# Patient Record
Sex: Female | Born: 1945
Health system: Southern US, Community
[De-identification: ages and names within clinical notes are randomized; demographics above are authoritative.]

## PROBLEM LIST (undated history)

## (undated) DIAGNOSIS — I2510 Atherosclerotic heart disease of native coronary artery without angina pectoris: Secondary | ICD-10-CM

## (undated) DIAGNOSIS — I10 Essential (primary) hypertension: Secondary | ICD-10-CM

## (undated) DIAGNOSIS — M1990 Unspecified osteoarthritis, unspecified site: Secondary | ICD-10-CM

## (undated) DIAGNOSIS — G629 Polyneuropathy, unspecified: Secondary | ICD-10-CM

## (undated) DIAGNOSIS — I8390 Asymptomatic varicose veins of unspecified lower extremity: Secondary | ICD-10-CM

## (undated) DIAGNOSIS — E039 Hypothyroidism, unspecified: Secondary | ICD-10-CM

## (undated) DIAGNOSIS — R58 Hemorrhage, not elsewhere classified: Secondary | ICD-10-CM

## (undated) DIAGNOSIS — I209 Angina pectoris, unspecified: Secondary | ICD-10-CM

## (undated) HISTORY — PX: SHOULDER ARTHROSCOPY: SHX128

## (undated) HISTORY — PX: TUMOR REMOVAL: SHX12

## (undated) HISTORY — PX: THYROID SURGERY: SHX805

## (undated) HISTORY — PX: JOINT REPLACEMENT: SHX530

## (undated) HISTORY — PX: ABDOMINAL HYSTERECTOMY: SHX81

---

## 2003-10-25 ENCOUNTER — Ambulatory Visit: Payer: Self-pay | Admitting: Internal Medicine

## 2004-05-02 ENCOUNTER — Ambulatory Visit: Payer: Self-pay | Admitting: Internal Medicine

## 2004-06-23 ENCOUNTER — Ambulatory Visit: Payer: Self-pay | Admitting: Unknown Physician Specialty

## 2005-04-24 ENCOUNTER — Ambulatory Visit: Payer: Self-pay | Admitting: Internal Medicine

## 2005-11-07 ENCOUNTER — Ambulatory Visit: Payer: Self-pay | Admitting: Internal Medicine

## 2006-07-19 ENCOUNTER — Observation Stay: Payer: Self-pay | Admitting: Internal Medicine

## 2006-07-19 ENCOUNTER — Other Ambulatory Visit: Payer: Self-pay

## 2006-08-03 ENCOUNTER — Ambulatory Visit: Payer: Self-pay | Admitting: Internal Medicine

## 2006-11-07 ENCOUNTER — Encounter: Payer: Self-pay | Admitting: Unknown Physician Specialty

## 2006-11-23 ENCOUNTER — Encounter: Payer: Self-pay | Admitting: Unknown Physician Specialty

## 2007-12-30 ENCOUNTER — Ambulatory Visit: Payer: Self-pay | Admitting: Internal Medicine

## 2008-08-02 ENCOUNTER — Inpatient Hospital Stay: Payer: Self-pay | Admitting: Internal Medicine

## 2008-08-10 ENCOUNTER — Ambulatory Visit: Payer: Self-pay | Admitting: Internal Medicine

## 2008-08-17 ENCOUNTER — Ambulatory Visit: Payer: Self-pay | Admitting: Internal Medicine

## 2008-08-31 ENCOUNTER — Ambulatory Visit: Payer: Self-pay | Admitting: Otolaryngology

## 2008-10-22 ENCOUNTER — Ambulatory Visit: Payer: Self-pay | Admitting: Unknown Physician Specialty

## 2008-10-26 ENCOUNTER — Ambulatory Visit: Payer: Self-pay | Admitting: Unknown Physician Specialty

## 2009-04-08 ENCOUNTER — Ambulatory Visit: Payer: Self-pay | Admitting: Internal Medicine

## 2009-10-03 ENCOUNTER — Emergency Department: Payer: Self-pay | Admitting: Emergency Medicine

## 2009-10-14 ENCOUNTER — Encounter: Admission: RE | Admit: 2009-10-14 | Discharge: 2009-10-14 | Payer: Self-pay | Admitting: Unknown Physician Specialty

## 2009-12-30 ENCOUNTER — Ambulatory Visit: Payer: Self-pay

## 2010-01-27 ENCOUNTER — Ambulatory Visit: Payer: Self-pay | Admitting: Unknown Physician Specialty

## 2010-02-03 ENCOUNTER — Ambulatory Visit: Payer: Self-pay | Admitting: Unknown Physician Specialty

## 2010-08-28 ENCOUNTER — Ambulatory Visit: Payer: Self-pay | Admitting: Internal Medicine

## 2011-05-31 ENCOUNTER — Ambulatory Visit: Payer: Self-pay | Admitting: Unknown Physician Specialty

## 2011-05-31 LAB — CREATININE, SERUM
Creatinine: 0.81 mg/dL (ref 0.60–1.30)
EGFR (African American): >60
EGFR (Non-African Amer.): >60

## 2011-07-13 ENCOUNTER — Ambulatory Visit: Payer: Self-pay

## 2011-08-29 ENCOUNTER — Ambulatory Visit: Payer: Self-pay | Admitting: Internal Medicine

## 2011-12-11 ENCOUNTER — Emergency Department: Payer: Self-pay | Admitting: Emergency Medicine

## 2011-12-11 LAB — TROPONIN I
Troponin-I: <0.02 ng/mL
Troponin-I: <0.02 ng/mL

## 2011-12-11 LAB — CBC
HCT: 45.2 % (ref 35.0–47.0)
HGB: 15.4 g/dL (ref 12.0–16.0)
MCH: 29.6 pg (ref 26.0–34.0)
MCHC: 34.1 g/dL (ref 32.0–36.0)
MCV: 87 fL (ref 80–100)
Platelet: 204 10*3/uL (ref 150–440)
RBC: 5.21 10*6/uL — ABNORMAL HIGH (ref 3.80–5.20)
RDW: 13.9 % (ref 11.5–14.5)
WBC: 9.2 10*3/uL (ref 3.6–11.0)

## 2011-12-11 LAB — COMPREHENSIVE METABOLIC PANEL
Albumin: 4.1 g/dL (ref 3.4–5.0)
Alkaline Phosphatase: 87 U/L (ref 50–136)
Anion Gap: 9 (ref 7–16)
BUN: 17 mg/dL (ref 7–18)
Bilirubin,Total: 0.3 mg/dL (ref 0.2–1.0)
Calcium, Total: 8.9 mg/dL (ref 8.5–10.1)
Chloride: 106 mmol/L (ref 98–107)
Co2: 27 mmol/L (ref 21–32)
Creatinine: 0.85 mg/dL (ref 0.60–1.30)
EGFR (African American): >60
EGFR (Non-African Amer.): >60
Glucose: 111 mg/dL — ABNORMAL HIGH (ref 65–99)
Osmolality: 285 (ref 275–301)
Potassium: 3.7 mmol/L (ref 3.5–5.1)
SGOT(AST): 17 U/L (ref 15–37)
SGPT (ALT): 18 U/L (ref 12–78)
Sodium: 142 mmol/L (ref 136–145)
Total Protein: 7.3 g/dL (ref 6.4–8.2)

## 2011-12-11 LAB — CK TOTAL AND CKMB (NOT AT ARMC)
CK, Total: 70 U/L (ref 21–215)
CK, Total: 89 U/L (ref 21–215)
CK-MB: 1.3 ng/mL (ref 0.5–3.6)
CK-MB: 1 ng/mL (ref 0.5–3.6)

## 2011-12-11 LAB — PROTIME-INR
INR: 0.9
Prothrombin Time: 12.9 secs (ref 11.5–14.7)

## 2011-12-11 LAB — APTT: Activated PTT: 26.2 secs (ref 23.6–35.9)

## 2012-03-21 ENCOUNTER — Ambulatory Visit: Payer: Self-pay | Admitting: Unknown Physician Specialty

## 2012-12-23 ENCOUNTER — Ambulatory Visit: Payer: Self-pay | Admitting: Internal Medicine

## 2013-01-08 ENCOUNTER — Ambulatory Visit: Payer: Self-pay | Admitting: Internal Medicine

## 2013-01-19 ENCOUNTER — Ambulatory Visit: Payer: Self-pay | Admitting: Internal Medicine

## 2013-01-19 HISTORY — PX: BREAST BIOPSY: SHX20

## 2013-01-20 LAB — PATHOLOGY REPORT

## 2013-07-02 ENCOUNTER — Emergency Department: Payer: Self-pay | Admitting: Emergency Medicine

## 2013-07-02 LAB — URINALYSIS, COMPLETE
Bacteria: NONE SEEN
Bilirubin,UR: NEGATIVE
Blood: NEGATIVE
Glucose,UR: NEGATIVE mg/dL (ref 0–75)
Ketone: NEGATIVE
Leukocyte Esterase: NEGATIVE
Nitrite: NEGATIVE
Ph: 6 (ref 4.5–8.0)
Protein: NEGATIVE
RBC,UR: 1 /HPF (ref 0–5)
Specific Gravity: 1.011 (ref 1.003–1.030)
Squamous Epithelial: 3
WBC UR: <1 /HPF (ref 0–5)

## 2013-07-05 DIAGNOSIS — E039 Hypothyroidism, unspecified: Secondary | ICD-10-CM | POA: Insufficient documentation

## 2013-07-05 DIAGNOSIS — I10 Essential (primary) hypertension: Secondary | ICD-10-CM | POA: Insufficient documentation

## 2013-07-05 DIAGNOSIS — K219 Gastro-esophageal reflux disease without esophagitis: Secondary | ICD-10-CM | POA: Insufficient documentation

## 2013-07-05 DIAGNOSIS — M1990 Unspecified osteoarthritis, unspecified site: Secondary | ICD-10-CM | POA: Insufficient documentation

## 2013-07-05 DIAGNOSIS — N289 Disorder of kidney and ureter, unspecified: Secondary | ICD-10-CM | POA: Insufficient documentation

## 2013-07-05 DIAGNOSIS — E785 Hyperlipidemia, unspecified: Secondary | ICD-10-CM | POA: Insufficient documentation

## 2013-07-07 DIAGNOSIS — M5416 Radiculopathy, lumbar region: Secondary | ICD-10-CM | POA: Insufficient documentation

## 2013-07-20 ENCOUNTER — Ambulatory Visit: Payer: Self-pay | Admitting: Nurse Practitioner

## 2013-09-17 DIAGNOSIS — R002 Palpitations: Secondary | ICD-10-CM | POA: Insufficient documentation

## 2014-01-05 ENCOUNTER — Emergency Department: Payer: Self-pay | Admitting: Emergency Medicine

## 2014-03-26 ENCOUNTER — Emergency Department: Payer: Self-pay | Admitting: Emergency Medicine

## 2015-02-25 DIAGNOSIS — M1711 Unilateral primary osteoarthritis, right knee: Secondary | ICD-10-CM | POA: Diagnosis not present

## 2015-03-18 DIAGNOSIS — Z79899 Other long term (current) drug therapy: Secondary | ICD-10-CM | POA: Diagnosis not present

## 2015-03-18 DIAGNOSIS — M1990 Unspecified osteoarthritis, unspecified site: Secondary | ICD-10-CM | POA: Diagnosis not present

## 2015-03-18 DIAGNOSIS — E039 Hypothyroidism, unspecified: Secondary | ICD-10-CM | POA: Diagnosis not present

## 2015-03-18 DIAGNOSIS — G8929 Other chronic pain: Secondary | ICD-10-CM | POA: Diagnosis not present

## 2015-03-18 DIAGNOSIS — E7800 Pure hypercholesterolemia, unspecified: Secondary | ICD-10-CM | POA: Diagnosis not present

## 2015-03-18 DIAGNOSIS — I10 Essential (primary) hypertension: Secondary | ICD-10-CM | POA: Diagnosis not present

## 2015-03-18 DIAGNOSIS — M545 Low back pain: Secondary | ICD-10-CM | POA: Diagnosis not present

## 2015-03-29 DIAGNOSIS — M25561 Pain in right knee: Secondary | ICD-10-CM | POA: Diagnosis not present

## 2015-03-29 DIAGNOSIS — G8929 Other chronic pain: Secondary | ICD-10-CM | POA: Diagnosis not present

## 2015-03-29 DIAGNOSIS — M1711 Unilateral primary osteoarthritis, right knee: Secondary | ICD-10-CM | POA: Diagnosis not present

## 2015-04-14 ENCOUNTER — Encounter
Admission: RE | Admit: 2015-04-14 | Discharge: 2015-04-14 | Disposition: A | Discharge status: Home / Self Care | Payer: PPO | Source: Ambulatory Visit | Attending: Orthopedic Surgery | Admitting: Orthopedic Surgery

## 2015-04-14 DIAGNOSIS — M1711 Unilateral primary osteoarthritis, right knee: Secondary | ICD-10-CM | POA: Insufficient documentation

## 2015-04-14 DIAGNOSIS — Z01812 Encounter for preprocedural laboratory examination: Secondary | ICD-10-CM | POA: Diagnosis not present

## 2015-04-14 DIAGNOSIS — Z01810 Encounter for preprocedural cardiovascular examination: Secondary | ICD-10-CM | POA: Diagnosis not present

## 2015-04-14 HISTORY — DX: Unspecified osteoarthritis, unspecified site: M19.90

## 2015-04-14 HISTORY — DX: Polyneuropathy, unspecified: G62.9

## 2015-04-14 HISTORY — DX: Asymptomatic varicose veins of unspecified lower extremity: I83.90

## 2015-04-14 HISTORY — DX: Hypothyroidism, unspecified: E03.9

## 2015-04-14 HISTORY — DX: Angina pectoris, unspecified: I20.9

## 2015-04-14 HISTORY — DX: Essential (primary) hypertension: I10

## 2015-04-14 HISTORY — DX: Hemorrhage, not elsewhere classified: R58

## 2015-04-14 LAB — CBC
HCT: 43.6 % (ref 35.0–47.0)
Hemoglobin: 14.5 g/dL (ref 12.0–16.0)
MCH: 28.5 pg (ref 26.0–34.0)
MCHC: 33.2 g/dL (ref 32.0–36.0)
MCV: 85.8 fL (ref 80.0–100.0)
Platelets: 173 10*3/uL (ref 150–440)
RBC: 5.08 MIL/uL (ref 3.80–5.20)
RDW: 13.5 % (ref 11.5–14.5)
WBC: 5.7 10*3/uL (ref 3.6–11.0)

## 2015-04-14 LAB — BASIC METABOLIC PANEL
Anion gap: 7 (ref 5–15)
BUN: 19 mg/dL (ref 6–20)
CO2: 25 mmol/L (ref 22–32)
Calcium: 9.3 mg/dL (ref 8.9–10.3)
Chloride: 106 mmol/L (ref 101–111)
Creatinine, Ser: 0.68 mg/dL (ref 0.44–1.00)
GFR calc Af Amer: >60 mL/min (ref >60)
GFR calc non Af Amer: >60 mL/min (ref >60)
Glucose, Bld: 104 mg/dL — ABNORMAL HIGH (ref 65–99)
Potassium: 4.1 mmol/L (ref 3.5–5.1)
Sodium: 138 mmol/L (ref 135–145)

## 2015-04-14 LAB — APTT: aPTT: 27 seconds (ref 24–36)

## 2015-04-14 LAB — URINALYSIS COMPLETE WITH MICROSCOPIC (ARMC ONLY)
Bacteria, UA: NONE SEEN
Bilirubin Urine: NEGATIVE
Glucose, UA: NEGATIVE mg/dL
Hgb urine dipstick: NEGATIVE
Ketones, ur: NEGATIVE mg/dL
Leukocytes, UA: NEGATIVE
Nitrite: NEGATIVE
Protein, ur: NEGATIVE mg/dL
Specific Gravity, Urine: 1.02 (ref 1.005–1.030)
pH: 5 (ref 5.0–8.0)

## 2015-04-14 LAB — PROTIME-INR
INR: 1.1
Prothrombin Time: 14.4 seconds (ref 11.4–15.0)

## 2015-04-14 LAB — TYPE AND SCREEN
ABO/RH(D): B NEG
Antibody Screen: NEGATIVE

## 2015-04-14 LAB — SURGICAL PCR SCREEN
MRSA, PCR: NEGATIVE
Staphylococcus aureus: NEGATIVE

## 2015-04-14 LAB — SEDIMENTATION RATE: Sed Rate: 8 mm/hr (ref 0–30)

## 2015-04-14 LAB — ABO/RH: ABO/RH(D): B NEG

## 2015-04-14 NOTE — Patient Instructions (Signed)
  Your procedure is scheduled on: 04/27/15 Wed Report to Day Surgery.2nd floor medical mall To find out your arrival time please call [phone] between 1PM - 3PM on 04/26/15 Tues.  Remember: Instructions that are not followed completely may result in serious medical risk, up to and including death, or upon the discretion of your surgeon and anesthesiologist your surgery may need to be rescheduled.    _x___ 1. Do not eat food or drink liquids after midnight. No gum chewing or hard candies.     ____ 2. No Alcohol for 24 hours before or after surgery.   ____ 3. Bring all medications with you on the day of surgery if instructed.    _x___ 4. Notify your doctor if there is any change in your medical condition     (cold, fever, infections).     Do not wear jewelry, make-up, hairpins, clips or nail polish.  Do not wear lotions, powders, or perfumes. You may wear deodorant.  Do not shave 48 hours prior to surgery. Men may shave face and neck.  Do not bring valuables to the hospital.    [facility] is not responsible for any belongings or valuables.               Contacts, dentures or bridgework may not be worn into surgery.  Leave your suitcase in the car. After surgery it may be brought to your room.  For patients admitted to the hospital, discharge time is determined by your                treatment team.   Patients discharged the day of surgery will not be allowed to drive home.   Please read over the following fact sheets that you were given:   MRSA Information   ____ Take these medicines the morning of surgery with A SIP OF WATER:    1. enalapril (VASOTEC) 20 MG tablet  2. levothyroxine (SYNTHROID, LEVOTHROID) 150 MCG tablet  3. traMADol (ULTRAM) 50 MG tablet  4.  5.  6.  ____ Fleet Enema (as directed)   _x___ Use CHG Soap as directed  ____ Use inhalers on the day of surgery  ____ Stop metformin 2 days prior to surgery    ____ Take 1/2 of usual insulin dose the night  before surgery and none on the morning of surgery.   _x___ Stop Coumadin/Plavix/aspirin on stop aspirin 1 week before surgery  ____ Stop Anti-inflammatories on    ____ Stop supplements until after surgery.    ____ Bring C-Pap to the hospital.

## 2015-04-16 LAB — URINE CULTURE

## 2015-04-18 LAB — LATEX, IGE: Latex: <0.1 kU/L

## 2015-04-27 ENCOUNTER — Inpatient Hospital Stay: Payer: PPO | Admitting: Anesthesiology

## 2015-04-27 ENCOUNTER — Inpatient Hospital Stay
Admission: RE | Admit: 2015-04-27 | Discharge: 2015-04-29 | DRG: 470 | Disposition: A | Discharge status: Home Health | Payer: PPO | Source: Ambulatory Visit | Attending: Orthopedic Surgery | Admitting: Orthopedic Surgery

## 2015-04-27 ENCOUNTER — Encounter
Admission: RE | Disposition: A | Discharge status: Home Health | Payer: Self-pay | Source: Ambulatory Visit | Attending: Orthopedic Surgery

## 2015-04-27 ENCOUNTER — Encounter: Payer: Self-pay | Admitting: Orthopedic Surgery

## 2015-04-27 ENCOUNTER — Inpatient Hospital Stay: Payer: PPO

## 2015-04-27 DIAGNOSIS — Z96652 Presence of left artificial knee joint: Secondary | ICD-10-CM | POA: Diagnosis present

## 2015-04-27 DIAGNOSIS — Z6841 Body Mass Index (BMI) 40.0 and over, adult: Secondary | ICD-10-CM

## 2015-04-27 DIAGNOSIS — Z79899 Other long term (current) drug therapy: Secondary | ICD-10-CM | POA: Diagnosis not present

## 2015-04-27 DIAGNOSIS — E039 Hypothyroidism, unspecified: Secondary | ICD-10-CM | POA: Diagnosis present

## 2015-04-27 DIAGNOSIS — K219 Gastro-esophageal reflux disease without esophagitis: Secondary | ICD-10-CM | POA: Diagnosis not present

## 2015-04-27 DIAGNOSIS — Z880 Allergy status to penicillin: Secondary | ICD-10-CM

## 2015-04-27 DIAGNOSIS — Z7982 Long term (current) use of aspirin: Secondary | ICD-10-CM | POA: Diagnosis not present

## 2015-04-27 DIAGNOSIS — K449 Diaphragmatic hernia without obstruction or gangrene: Secondary | ICD-10-CM | POA: Insufficient documentation

## 2015-04-27 DIAGNOSIS — M1711 Unilateral primary osteoarthritis, right knee: Principal | ICD-10-CM | POA: Diagnosis present

## 2015-04-27 DIAGNOSIS — Z96651 Presence of right artificial knee joint: Secondary | ICD-10-CM | POA: Diagnosis not present

## 2015-04-27 DIAGNOSIS — E785 Hyperlipidemia, unspecified: Secondary | ICD-10-CM | POA: Diagnosis not present

## 2015-04-27 DIAGNOSIS — I8390 Asymptomatic varicose veins of unspecified lower extremity: Secondary | ICD-10-CM | POA: Diagnosis present

## 2015-04-27 DIAGNOSIS — I10 Essential (primary) hypertension: Secondary | ICD-10-CM | POA: Diagnosis present

## 2015-04-27 DIAGNOSIS — Z885 Allergy status to narcotic agent status: Secondary | ICD-10-CM

## 2015-04-27 DIAGNOSIS — M179 Osteoarthritis of knee, unspecified: Secondary | ICD-10-CM | POA: Diagnosis not present

## 2015-04-27 DIAGNOSIS — Z96659 Presence of unspecified artificial knee joint: Secondary | ICD-10-CM

## 2015-04-27 DIAGNOSIS — Z888 Allergy status to other drugs, medicaments and biological substances status: Secondary | ICD-10-CM | POA: Diagnosis not present

## 2015-04-27 DIAGNOSIS — I739 Peripheral vascular disease, unspecified: Secondary | ICD-10-CM | POA: Diagnosis not present

## 2015-04-27 DIAGNOSIS — Z471 Aftercare following joint replacement surgery: Secondary | ICD-10-CM | POA: Diagnosis not present

## 2015-04-27 HISTORY — PX: KNEE ARTHROPLASTY: SHX992

## 2015-04-27 SURGERY — ARTHROPLASTY, KNEE, TOTAL, USING IMAGELESS COMPUTER-ASSISTED NAVIGATION
Anesthesia: Spinal | Laterality: Right | Wound class: Clean

## 2015-04-27 MED ORDER — LEVOTHYROXINE SODIUM 75 MCG PO TABS
150.0000 ug | ORAL_TABLET | Freq: Every day | ORAL | Status: DC
  Administered 2015-04-28 – 2015-04-29 (×2): 150 ug via ORAL
  Filled 2015-04-27 (×2): qty 2

## 2015-04-27 MED ORDER — ALUM & MAG HYDROXIDE-SIMETH 200-200-20 MG/5ML PO SUSP: 30.0000 mL | ORAL | Status: DC | PRN

## 2015-04-27 MED ORDER — ACETAMINOPHEN 10 MG/ML IV SOLN
INTRAVENOUS | Status: DC | PRN
  Administered 2015-04-27: 1000 mg via INTRAVENOUS

## 2015-04-27 MED ORDER — DIPHENHYDRAMINE HCL 12.5 MG/5ML PO ELIX
12.5000 mg | ORAL_SOLUTION | ORAL | Status: DC | PRN
  Administered 2015-04-28 (×2): 25 mg via ORAL
  Administered 2015-04-29: 12.5 mg via ORAL
  Filled 2015-04-27 (×4): qty 10

## 2015-04-27 MED ORDER — TRANEXAMIC ACID 1000 MG/10ML IV SOLN
1500.0000 mg | INTRAVENOUS | Status: AC
  Administered 2015-04-27: 1500 mg via INTRAVENOUS
  Filled 2015-04-27: qty 15

## 2015-04-27 MED ORDER — MAGNESIUM HYDROXIDE 400 MG/5ML PO SUSP
30.0000 mL | Freq: Every day | ORAL | Status: DC | PRN
  Administered 2015-04-28 – 2015-04-29 (×2): 30 mL via ORAL
  Filled 2015-04-27 (×2): qty 30

## 2015-04-27 MED ORDER — NEOMYCIN-POLYMYXIN B GU 40-200000 IR SOLN
Status: AC
  Filled 2015-04-27: qty 20

## 2015-04-27 MED ORDER — SENNOSIDES-DOCUSATE SODIUM 8.6-50 MG PO TABS
1.0000 | ORAL_TABLET | Freq: Two times a day (BID) | ORAL | Status: DC
  Administered 2015-04-27 – 2015-04-28 (×3): 1 via ORAL
  Filled 2015-04-27 (×3): qty 1

## 2015-04-27 MED ORDER — FENTANYL CITRATE (PF) 100 MCG/2ML IJ SOLN
INTRAMUSCULAR | Status: AC
  Filled 2015-04-27: qty 2

## 2015-04-27 MED ORDER — BUPIVACAINE-EPINEPHRINE (PF) 0.25% -1:200000 IJ SOLN
INTRAMUSCULAR | Status: AC
  Filled 2015-04-27: qty 30

## 2015-04-27 MED ORDER — LACTATED RINGERS IV SOLN
INTRAVENOUS | Status: DC
  Administered 2015-04-27: 1000 mL via INTRAVENOUS

## 2015-04-27 MED ORDER — ACETAMINOPHEN 10 MG/ML IV SOLN
1000.0000 mg | Freq: Four times a day (QID) | INTRAVENOUS | Status: AC
  Administered 2015-04-27 – 2015-04-28 (×4): 1000 mg via INTRAVENOUS
  Filled 2015-04-27 (×5): qty 100

## 2015-04-27 MED ORDER — FENTANYL CITRATE (PF) 100 MCG/2ML IJ SOLN
INTRAMUSCULAR | Status: DC | PRN
  Administered 2015-04-27 (×2): 25 ug via INTRAVENOUS
  Administered 2015-04-27: 50 ug via INTRAVENOUS
  Administered 2015-04-27: 25 ug via INTRAVENOUS
  Administered 2015-04-27: 50 ug via INTRAVENOUS
  Administered 2015-04-27: 25 ug via INTRAVENOUS

## 2015-04-27 MED ORDER — PHENOL 1.4 % MT LIQD: 1.0000 | OROMUCOSAL | Status: DC | PRN

## 2015-04-27 MED ORDER — ENOXAPARIN SODIUM 30 MG/0.3ML ~~LOC~~ SOLN
30.0000 mg | Freq: Two times a day (BID) | SUBCUTANEOUS | Status: DC
  Administered 2015-04-28 – 2015-04-29 (×3): 30 mg via SUBCUTANEOUS
  Filled 2015-04-27 (×3): qty 0.3

## 2015-04-27 MED ORDER — KETAMINE HCL 50 MG/ML IJ SOLN
INTRAMUSCULAR | Status: DC | PRN
  Administered 2015-04-27: 50 mg via INTRAMUSCULAR
  Administered 2015-04-27: 100 mg via INTRAMUSCULAR

## 2015-04-27 MED ORDER — CLINDAMYCIN PHOSPHATE 900 MG/50ML IV SOLN
INTRAVENOUS | Status: AC
  Administered 2015-04-27: 900 mg via INTRAVENOUS
  Filled 2015-04-27: qty 50

## 2015-04-27 MED ORDER — PANTOPRAZOLE SODIUM 40 MG PO TBEC
40.0000 mg | DELAYED_RELEASE_TABLET | Freq: Two times a day (BID) | ORAL | Status: DC
  Administered 2015-04-27 – 2015-04-29 (×4): 40 mg via ORAL
  Filled 2015-04-27 (×4): qty 1

## 2015-04-27 MED ORDER — ENALAPRIL MALEATE 10 MG PO TABS
20.0000 mg | ORAL_TABLET | Freq: Every day | ORAL | Status: DC
  Administered 2015-04-28 – 2015-04-29 (×2): 20 mg via ORAL
  Filled 2015-04-27 (×2): qty 2

## 2015-04-27 MED ORDER — MENTHOL 3 MG MT LOZG: 1.0000 | LOZENGE | OROMUCOSAL | Status: DC | PRN

## 2015-04-27 MED ORDER — ONDANSETRON HCL 4 MG/2ML IJ SOLN
4.0000 mg | Freq: Four times a day (QID) | INTRAMUSCULAR | Status: DC | PRN
  Administered 2015-04-27: 4 mg via INTRAVENOUS
  Filled 2015-04-27: qty 2

## 2015-04-27 MED ORDER — TAPENTADOL HCL 50 MG PO TABS
50.0000 mg | ORAL_TABLET | ORAL | Status: DC | PRN
Start: 2015-04-27 — End: 2015-04-29
  Administered 2015-04-27 – 2015-04-28 (×3): 100 mg via ORAL
  Administered 2015-04-28: 50 mg via ORAL
  Filled 2015-04-27: qty 2
  Filled 2015-04-27: qty 1
  Filled 2015-04-27 (×2): qty 2

## 2015-04-27 MED ORDER — HYDROMORPHONE HCL 1 MG/ML IJ SOLN: 1.0000 mg | INTRAMUSCULAR | Status: DC | PRN

## 2015-04-27 MED ORDER — FAMOTIDINE 20 MG PO TABS
20.0000 mg | ORAL_TABLET | Freq: Once | ORAL | Status: AC
Start: 2015-04-27 — End: 2015-04-27
  Administered 2015-04-27: 20 mg via ORAL

## 2015-04-27 MED ORDER — MIDAZOLAM HCL 5 MG/5ML IJ SOLN
INTRAMUSCULAR | Status: DC | PRN
  Administered 2015-04-27 (×2): 1 mg via INTRAVENOUS
  Administered 2015-04-27: 2 mg via INTRAVENOUS

## 2015-04-27 MED ORDER — BUPIVACAINE LIPOSOME 1.3 % IJ SUSP
INTRAMUSCULAR | Status: AC
  Filled 2015-04-27: qty 20

## 2015-04-27 MED ORDER — ACETAMINOPHEN 10 MG/ML IV SOLN
INTRAVENOUS | Status: AC
  Filled 2015-04-27: qty 100

## 2015-04-27 MED ORDER — NEOMYCIN-POLYMYXIN B GU 40-200000 IR SOLN
Status: DC | PRN
  Administered 2015-04-27: 12 mL

## 2015-04-27 MED ORDER — ONDANSETRON HCL 4 MG/2ML IJ SOLN
INTRAMUSCULAR | Status: DC | PRN
  Administered 2015-04-27: 4 mg via INTRAVENOUS

## 2015-04-27 MED ORDER — HYDROMORPHONE HCL 1 MG/ML IJ SOLN
INTRAMUSCULAR | Status: AC
Start: 2015-04-27 — End: 2015-04-28
  Filled 2015-04-27: qty 1

## 2015-04-27 MED ORDER — BISACODYL 10 MG RE SUPP
10.0000 mg | Freq: Every day | RECTAL | Status: DC | PRN
  Administered 2015-04-28: 10 mg via RECTAL
  Filled 2015-04-27: qty 1

## 2015-04-27 MED ORDER — PROPOFOL 500 MG/50ML IV EMUL
INTRAVENOUS | Status: DC | PRN
  Administered 2015-04-27: 75 ug/kg/min via INTRAVENOUS

## 2015-04-27 MED ORDER — FENTANYL CITRATE (PF) 100 MCG/2ML IJ SOLN
25.0000 ug | INTRAMUSCULAR | Status: DC | PRN
  Administered 2015-04-27 (×3): 50 ug via INTRAVENOUS

## 2015-04-27 MED ORDER — SODIUM CHLORIDE 0.9 % IV SOLN
INTRAVENOUS | Status: DC
  Administered 2015-04-27 – 2015-04-28 (×3): via INTRAVENOUS

## 2015-04-27 MED ORDER — FLEET ENEMA 7-19 GM/118ML RE ENEM: 1.0000 | ENEMA | Freq: Once | RECTAL | Status: DC | PRN

## 2015-04-27 MED ORDER — SODIUM CHLORIDE 0.9 % IV SOLN
10000.0000 ug | INTRAVENOUS | Status: DC | PRN
  Administered 2015-04-27: 20 ug/min via INTRAVENOUS

## 2015-04-27 MED ORDER — BUPIVACAINE HCL (PF) 0.5 % IJ SOLN
INTRAMUSCULAR | Status: DC | PRN
  Administered 2015-04-27: 2 mL

## 2015-04-27 MED ORDER — SODIUM CHLORIDE FLUSH 0.9 % IV SOLN
INTRAVENOUS | Status: AC
  Filled 2015-04-27: qty 10

## 2015-04-27 MED ORDER — OXYCODONE HCL 5 MG PO TABS: 5.0000 mg | ORAL_TABLET | Freq: Once | ORAL | Status: DC | PRN

## 2015-04-27 MED ORDER — FERROUS SULFATE 325 (65 FE) MG PO TABS
325.0000 mg | ORAL_TABLET | Freq: Two times a day (BID) | ORAL | Status: DC
  Administered 2015-04-27 – 2015-04-29 (×4): 325 mg via ORAL
  Filled 2015-04-27 (×5): qty 1

## 2015-04-27 MED ORDER — ONDANSETRON HCL 4 MG PO TABS: 4.0000 mg | ORAL_TABLET | Freq: Four times a day (QID) | ORAL | Status: DC | PRN

## 2015-04-27 MED ORDER — TRANEXAMIC ACID 1000 MG/10ML IV SOLN
1500.0000 mg | Freq: Once | INTRAVENOUS | Status: AC
  Administered 2015-04-27: 1500 mg via INTRAVENOUS
  Filled 2015-04-27: qty 15

## 2015-04-27 MED ORDER — PROPOFOL 10 MG/ML IV BOLUS
INTRAVENOUS | Status: DC | PRN
  Administered 2015-04-27 (×2): 20 mg via INTRAVENOUS
  Administered 2015-04-27: 10 mg via INTRAVENOUS
  Administered 2015-04-27 (×2): 20 mg via INTRAVENOUS

## 2015-04-27 MED ORDER — CELECOXIB 200 MG PO CAPS
200.0000 mg | ORAL_CAPSULE | Freq: Two times a day (BID) | ORAL | Status: DC
  Administered 2015-04-27 – 2015-04-29 (×4): 200 mg via ORAL
  Filled 2015-04-27 (×4): qty 1

## 2015-04-27 MED ORDER — METOCLOPRAMIDE HCL 10 MG PO TABS
10.0000 mg | ORAL_TABLET | Freq: Three times a day (TID) | ORAL | Status: AC
  Administered 2015-04-27 – 2015-04-29 (×7): 10 mg via ORAL
  Filled 2015-04-27 (×8): qty 1

## 2015-04-27 MED ORDER — SODIUM CHLORIDE 0.9 % IV SOLN
INTRAVENOUS | Status: DC | PRN
  Administered 2015-04-27: 60 mL

## 2015-04-27 MED ORDER — TETRACAINE HCL 1 % IJ SOLN
INTRAMUSCULAR | Status: DC | PRN
  Administered 2015-04-27: 10 mg via INTRASPINAL

## 2015-04-27 MED ORDER — BUPIVACAINE-EPINEPHRINE 0.25% -1:200000 IJ SOLN
INTRAMUSCULAR | Status: DC | PRN
  Administered 2015-04-27: 30 mL

## 2015-04-27 MED ORDER — HYDROMORPHONE HCL 1 MG/ML IJ SOLN
0.5000 mg | INTRAMUSCULAR | Status: AC | PRN
  Administered 2015-04-27 (×4): 0.5 mg via INTRAVENOUS

## 2015-04-27 MED ORDER — ACETAMINOPHEN 325 MG PO TABS: 650.0000 mg | ORAL_TABLET | Freq: Four times a day (QID) | ORAL | Status: DC | PRN

## 2015-04-27 MED ORDER — ACETAMINOPHEN 650 MG RE SUPP: 650.0000 mg | Freq: Four times a day (QID) | RECTAL | Status: DC | PRN

## 2015-04-27 MED ORDER — SODIUM CHLORIDE 0.9 % IJ SOLN
INTRAMUSCULAR | Status: AC
  Filled 2015-04-27: qty 50

## 2015-04-27 MED ORDER — HYDROMORPHONE HCL 1 MG/ML IJ SOLN
INTRAMUSCULAR | Status: AC
  Filled 2015-04-27: qty 1

## 2015-04-27 MED ORDER — FAMOTIDINE 20 MG PO TABS
ORAL_TABLET | ORAL | Status: AC
  Administered 2015-04-27: 20 mg via ORAL
  Filled 2015-04-27: qty 1

## 2015-04-27 MED ORDER — OXYCODONE HCL 5 MG/5ML PO SOLN: 5.0000 mg | Freq: Once | ORAL | Status: DC | PRN

## 2015-04-27 MED ORDER — TRAMADOL HCL 50 MG PO TABS
50.0000 mg | ORAL_TABLET | ORAL | Status: DC | PRN
  Administered 2015-04-27 (×2): 50 mg via ORAL
  Administered 2015-04-28 – 2015-04-29 (×3): 100 mg via ORAL
  Filled 2015-04-27: qty 2
  Filled 2015-04-27: qty 1
  Filled 2015-04-27: qty 2
  Filled 2015-04-27: qty 1
  Filled 2015-04-27: qty 2

## 2015-04-27 MED ORDER — CLINDAMYCIN PHOSPHATE 600 MG/50ML IV SOLN
600.0000 mg | Freq: Four times a day (QID) | INTRAVENOUS | Status: AC
  Administered 2015-04-27 – 2015-04-28 (×3): 600 mg via INTRAVENOUS
  Filled 2015-04-27 (×4): qty 50

## 2015-04-27 MED ORDER — TORSEMIDE 20 MG PO TABS
10.0000 mg | ORAL_TABLET | Freq: Every day | ORAL | Status: DC
  Administered 2015-04-27 – 2015-04-29 (×3): 10 mg via ORAL
  Filled 2015-04-27 (×2): qty 1
  Filled 2015-04-27: qty 2
  Filled 2015-04-27: qty 1

## 2015-04-27 SURGICAL SUPPLY — 59 items
AUTOTRANSFUS HAS 1/8 (MISCELLANEOUS) ×2
BATTERY INSTRU NAVIGATION (MISCELLANEOUS) ×8 IMPLANT
BLADE SAW 1 (BLADE) ×2 IMPLANT
BLADE SAW 1/2 (BLADE) ×2 IMPLANT
BONE CEMENT GENTAMICIN (Cement) ×4 IMPLANT
CANISTER SUCT 1200ML W/VALVE (MISCELLANEOUS) ×2 IMPLANT
CANISTER SUCT 3000ML (MISCELLANEOUS) ×4 IMPLANT
CAPT KNEE TOTAL 3 ATTUNE ×2 IMPLANT
CATH TRAY METER 16FR LF (MISCELLANEOUS) ×2 IMPLANT
CEMENT BONE GENTAMICIN 40 (Cement) ×2 IMPLANT
COOLER POLAR GLACIER W/PUMP (MISCELLANEOUS) ×2 IMPLANT
DRAPE SHEET LG 3/4 BI-LAMINATE (DRAPES) ×2 IMPLANT
DRSG DERMACEA 8X12 NADH (GAUZE/BANDAGES/DRESSINGS) ×2 IMPLANT
DRSG OPSITE POSTOP 4X14 (GAUZE/BANDAGES/DRESSINGS) ×2 IMPLANT
DRSG TEGADERM 4X4.75 (GAUZE/BANDAGES/DRESSINGS) ×2 IMPLANT
DURAPREP 26ML APPLICATOR (WOUND CARE) ×4 IMPLANT
ELECT CAUTERY BLADE 6.4 (BLADE) ×2 IMPLANT
ELECT REM PT RETURN 9FT ADLT (ELECTROSURGICAL) ×2
ELECTRODE REM PT RTRN 9FT ADLT (ELECTROSURGICAL) ×1 IMPLANT
EX-PIN ORTHOLOCK NAV 4X150 (PIN) ×4 IMPLANT
GLOVE BIO SURGEON STRL SZ7 (GLOVE) ×4 IMPLANT
GLOVE BIOGEL M STRL SZ7.5 (GLOVE) ×4 IMPLANT
GLOVE INDICATOR 7.5 STRL GRN (GLOVE) ×4 IMPLANT
GLOVE INDICATOR 8.0 STRL GRN (GLOVE) ×2 IMPLANT
GLOVE SURG 9.0 ORTHO LTXF (GLOVE) ×2 IMPLANT
GLOVE SURG ORTHO 9.0 STRL STRW (GLOVE) ×2 IMPLANT
GOWN STRL REUS W/ TWL LRG LVL3 (GOWN DISPOSABLE) ×3 IMPLANT
GOWN STRL REUS W/TWL 2XL LVL3 (GOWN DISPOSABLE) ×2 IMPLANT
GOWN STRL REUS W/TWL LRG LVL3 (GOWN DISPOSABLE) ×3
HANDPIECE SUCTION TUBG SURGILV (MISCELLANEOUS) ×2 IMPLANT
HOLDER FOLEY CATH W/STRAP (MISCELLANEOUS) ×4 IMPLANT
HOOD PEEL AWAY FLYTE STAYCOOL (MISCELLANEOUS) ×4 IMPLANT
KIT RM TURNOVER STRD PROC AR (KITS) ×2 IMPLANT
KNIFE SCULPS 14X20 (INSTRUMENTS) ×2 IMPLANT
NDL SAFETY 18GX1.5 (NEEDLE) ×2 IMPLANT
NEEDLE SPNL 20GX3.5 QUINCKE YW (NEEDLE) ×2 IMPLANT
NS IRRIG 500ML POUR BTL (IV SOLUTION) ×2 IMPLANT
PACK TOTAL KNEE (MISCELLANEOUS) ×2 IMPLANT
PAD WRAPON POLAR KNEE (MISCELLANEOUS) ×1 IMPLANT
PIN DRILL QUICK PACK ×2 IMPLANT
PIN FIXATION 1/8DIA X 3INL (PIN) ×2 IMPLANT
SOL .9 NS 3000ML IRR  AL (IV SOLUTION) ×1
SOL .9 NS 3000ML IRR UROMATIC (IV SOLUTION) ×1 IMPLANT
SOL PREP PVP 2OZ (MISCELLANEOUS) ×2
SOLUTION PREP PVP 2OZ (MISCELLANEOUS) ×1 IMPLANT
SPONGE DRAIN TRACH 4X4 STRL 2S (GAUZE/BANDAGES/DRESSINGS) ×2 IMPLANT
STAPLER SKIN PROX 35W (STAPLE) ×2 IMPLANT
SUCTION FRAZIER HANDLE 10FR (MISCELLANEOUS) ×2
SUCTION TUBE FRAZIER 10FR DISP (MISCELLANEOUS) ×2 IMPLANT
SUT VIC AB 0 CT1 36 (SUTURE) ×4 IMPLANT
SUT VIC AB 1 CT1 36 (SUTURE) ×6 IMPLANT
SUT VIC AB 2-0 CT2 27 (SUTURE) ×2 IMPLANT
SYR 20CC LL (SYRINGE) ×2 IMPLANT
SYR 30ML LL (SYRINGE) ×2 IMPLANT
SYR 50ML LL SCALE MARK (SYRINGE) ×2 IMPLANT
SYSTEM AUTOTRANSFUS DUAL TROCR (MISCELLANEOUS) ×1 IMPLANT
TOWEL OR 17X26 4PK STRL BLUE (TOWEL DISPOSABLE) ×2 IMPLANT
TOWER CARTRIDGE SMART MIX (DISPOSABLE) ×2 IMPLANT
WRAPON POLAR PAD KNEE (MISCELLANEOUS) ×2

## 2015-04-27 NOTE — H&P (Signed)
The patient has been re-examined, and the chart reviewed, and there have been no interval changes to the documented history and physical.    The risks, benefits, and alternatives have been discussed at length. The patient expressed understanding of the risks benefits and agreed with plans for surgical intervention.  Uriel Dowding P. Lylia Karn, Jr. M.D.    

## 2015-04-27 NOTE — OR Nursing (Signed)
Patient still hurting and Dr. Ronelle Nigh contacted and gave order for Dilaudid.

## 2015-04-27 NOTE — Anesthesia Procedure Notes (Addendum)
Spinal Patient location during procedure: OR Start time: 04/27/2015 11:00 AM End time: 04/27/2015 11:13 AM Staffing Anesthesiologist: Andria Frames Resident/CRNA: Johnna Acosta Performed by: anesthesiologist and resident/CRNA  Preanesthetic Checklist Completed: patient identified, site marked, surgical consent, pre-op evaluation, timeout performed, IV checked, risks and benefits discussed and monitors and equipment checked Spinal Block Patient position: sitting Prep: Betadine Patient monitoring: heart rate, continuous pulse ox, blood pressure and cardiac monitor Approach: midline Location: L4-5 Injection technique: single-shot Needle Needle type: Whitacre and Introducer  Needle gauge: 25 G Needle length: 9 cm Assessment Sensory level: T10 Additional Notes First attempt by CRNA 2nd by MD.  Negative paresthesia. Negative blood return. Positive free-flowing CSF. Expiration date of kit checked and confirmed. Patient tolerated procedure well, without complications.    Date/Time: 04/27/2015 11:20 AM Performed by: Johnna Acosta Pre-anesthesia Checklist: Patient identified, Emergency Drugs available, Suction available, Patient being monitored and Timeout performed Patient Re-evaluated:Patient Re-evaluated prior to inductionOxygen Delivery Method: Simple face mask

## 2015-04-27 NOTE — Anesthesia Preprocedure Evaluation (Addendum)
Anesthesia Evaluation  Patient identified by MRN, date of birth, ID band Patient awake    Reviewed: Allergy & Precautions, H&P , NPO status , Patient's Chart, lab work & pertinent test results  History of Anesthesia Complications Negative for: history of anesthetic complications  Airway Mallampati: III  TM Distance: >3 FB Neck ROM: limited    Dental  (+) Poor Dentition, Chipped, Missing, Upper Dentures   Pulmonary neg pulmonary ROS, neg shortness of breath,    Pulmonary exam normal breath sounds clear to auscultation       Cardiovascular Exercise Tolerance: Good hypertension, (-) angina+ Peripheral Vascular Disease  (-) Past MI and (-) DOE Normal cardiovascular exam Rhythm:regular Rate:Normal     Neuro/Psych  Neuromuscular disease negative psych ROS   GI/Hepatic Neg liver ROS, hiatal hernia, GERD  Controlled,  Endo/Other  Hypothyroidism Morbid obesity  Renal/GU Renal diseasenegative Renal ROS  negative genitourinary   Musculoskeletal  (+) Arthritis ,   Abdominal   Peds  Hematology negative hematology ROS (+)   Anesthesia Other Findings Past Medical History:   Hypothyroidism                                               Hypertension                                                 Angina pectoris syndrome (HCC)                               Arthritis                                                    Varicose vein of leg                                         Arterial hemorrhage                                            Comment:Lt ankle possible venous bleed   Neuropathy (HCC)                                            Past Surgical History:   JOINT REPLACEMENT                                               Comment:lt knee   THYROID SURGERY                                 Bilateral  SHOULDER ARTHROSCOPY                            Left              ABDOMINAL HYSTERECTOMY                                         TUMOR REMOVAL                                   Right                Comment:neck   Patient endorses no problems with oxycodone.  Reproductive/Obstetrics negative OB ROS                          Anesthesia Physical Anesthesia Plan  ASA: III  Anesthesia Plan: Spinal   Post-op Pain Management:    Induction:   Airway Management Planned:   Additional Equipment:   Intra-op Plan:   Post-operative Plan:   Informed Consent: I have reviewed the patients History and Physical, chart, labs and discussed the procedure including the risks, benefits and alternatives for the proposed anesthesia with the patient or authorized representative who has indicated his/her understanding and acceptance.   Dental Advisory Given  Plan Discussed with: Anesthesiologist, CRNA and Surgeon  Anesthesia Plan Comments: (Patient has medical clearance for this procedure )       Anesthesia Quick Evaluation

## 2015-04-27 NOTE — Transfer of Care (Signed)
Immediate Anesthesia Transfer of Care Note  Patient: Brianna Dalton  Procedure(s) Performed: Procedure(s): COMPUTER ASSISTED TOTAL KNEE ARTHROPLASTY (Right)  Patient Location: PACU  Anesthesia Type:Spinal  Level of Consciousness: awake, alert  and oriented  Airway & Oxygen Therapy: Patient Spontanous Breathing and Patient connected to face mask oxygen  Post-op Assessment: Report given to RN and Post -op Vital signs reviewed and stable  Post vital signs: stable  Last Vitals:  Filed Vitals:   04/27/15 1023 04/27/15 1529  BP: 145/90 127/80  Pulse: 83 73  Temp: 36.6 C 36.2 C  Resp: 18 9    Complications: No apparent anesthesia complications

## 2015-04-27 NOTE — Brief Op Note (Signed)
04/27/2015  3:30 PM  PATIENT:  Corinna Gab  70 y.o. female  PRE-OPERATIVE DIAGNOSIS:  OSTEOARTHRITIS of the right knee  POST-OPERATIVE DIAGNOSIS:  Same  PROCEDURE:  Procedure(s): COMPUTER ASSISTED TOTAL KNEE ARTHROPLASTY (Right)  SURGEON:  Surgeon(s) and Role:    * Dereck Leep, MD - Primary  ASSISTANTS: Vance Peper, PA  ANESTHESIA:   spinal  EBL:  Total I/O In: 1700 [I.V.:1700] Out: 325 [Urine:300; Blood:25]  BLOOD ADMINISTERED:none  DRAINS: 2 medium drains to a reinfusion system   LOCAL MEDICATIONS USED:  NONE  SPECIMEN:  No Specimen  DISPOSITION OF SPECIMEN:  N/A  COUNTS:  YES  TOURNIQUET:   Total Tourniquet Time Documented: Thigh (Right) - 122 minutes Total: Thigh (Right) - 122 minutes   DICTATION: .Viviann Spare Dictation  PLAN OF CARE: Admit to inpatient   PATIENT DISPOSITION:  PACU - hemodynamically stable.   Delay start of Pharmacological VTE agent (>24hrs) due to surgical blood loss or risk of bleeding: yes

## 2015-04-27 NOTE — Op Note (Signed)
OPERATIVE NOTE  DATE OF SURGERY:  04/27/2015  PATIENT NAME:  Brianna Dalton   DOB: [birth year]  MRN: [id number]  PRE-OPERATIVE DIAGNOSIS: Degenerative arthrosis of the right knee, primary  POST-OPERATIVE DIAGNOSIS:  Same  PROCEDURE:  Right total knee arthroplasty using computer-assisted navigation  SURGEON:  Marciano Sequin. M.D.  ASSISTANT:  Vance Peper, PA (present and scrubbed throughout the case, critical for assistance with exposure, retraction, instrumentation, and closure)  ANESTHESIA: spinal  ESTIMATED BLOOD LOSS: 25 mL  FLUIDS REPLACED: 1600 mL of crystalloid  TOURNIQUET TIME: 122 minutes  DRAINS: 2 medium drains to a reinfusion system  SOFT TISSUE RELEASES: Anterior cruciate ligament, posterior cruciate ligament, deep medial collateral ligament, patellofemoral ligament   IMPLANTS UTILIZED: DePuy Attune size 7 posterior stabilized femoral component (cemented), size 7 rotating platform tibial component (cemented), 41 mm medialized dome patella (cemented), and a 5 mm stabilized rotating platform polyethylene insert.  INDICATIONS FOR SURGERY: Brianna Dalton is a 70 y.o. year old female with a long history of progressive knee pain. X-rays demonstrated severe degenerative changes in tricompartmental fashion. The patient had not seen any significant improvement despite conservative nonsurgical intervention. After discussion of the risks and benefits of surgical intervention, the patient expressed understanding of the risks benefits and agree with plans for total knee arthroplasty.   The risks, benefits, and alternatives were discussed at length including but not limited to the risks of infection, bleeding, nerve injury, stiffness, blood clots, the need for revision surgery, cardiopulmonary complications, among others, and they were willing to proceed.  PROCEDURE IN DETAIL: The patient was brought into the operating room and, after adequate spinal anesthesia was achieved, a  tourniquet was placed on the patient's upper thigh. The patient's knee and leg were cleaned and prepped with alcohol and DuraPrep and draped in the usual sterile fashion. A "timeout" was performed as per usual protocol. The lower extremity was exsanguinated using an Esmarch, and the tourniquet was inflated to 300 mmHg. An anterior longitudinal incision was made followed by a standard mid vastus approach. The deep fibers of the medial collateral ligament were elevated in a subperiosteal fashion off of the medial flare of the tibia so as to maintain a continuous soft tissue sleeve. The patella was subluxed laterally and the patellofemoral ligament was incised. Inspection of the knee demonstrated severe degenerative changes with full-thickness loss of articular cartilage. Osteophytes were debrided using a rongeur. Anterior and posterior cruciate ligaments were excised. Two 4.0 mm Schanz pins were inserted in the femur and into the tibia for attachment of the array of trackers used for computer-assisted navigation. Hip center was identified using a circumduction technique. Distal landmarks were mapped using the computer. The distal femur and proximal tibia were mapped using the computer. The distal femoral cutting guide was positioned using computer-assisted navigation so as to achieve a 5 distal valgus cut. The femur was sized and it was felt that a size 7 femoral component was appropriate. A size 7 femoral cutting guide was positioned and the anterior cut was performed and verified using the computer. This was followed by completion of the posterior and chamfer cuts. Femoral cutting guide for the central box was then positioned in the center box cut was performed.  Attention was then directed to the proximal tibia. Medial and lateral menisci were excised. The extramedullary tibial cutting guide was positioned using computer-assisted navigation so as to achieve a 0 varus-valgus alignment and 3 posterior slope. The  cut was performed and verified using the  computer. The proximal tibia was sized and it was felt that a size 7 tibial tray was appropriate. Tibial and femoral trials were inserted followed by insertion of a 5 mm polyethylene insert. This allowed for excellent mediolateral soft tissue balancing both in flexion and in full extension. Finally, the patella was cut and prepared so as to accommodate a 41 mm 3 medialized dome patella. A patella trial was placed and the knee was placed through a range of motion with excellent patellar tracking appreciated. The femoral trial was removed after debridement of posterior osteophytes. The central post-hole for the tibial component was reamed followed by insertion of a keel punch. Tibial trials were then removed. Cut surfaces of bone were irrigated with copious amounts of normal saline with antibiotic solution using pulsatile lavage and then suctioned dry. Polymethylmethacrylate cement with gentamicin was prepared in the usual fashion using a vacuum mixer. Cement was applied to the cut surface of the proximal tibia as well as along the undersurface of a size 7 rotating platform tibial component. Tibial component was positioned and impacted into place. Excess cement was removed using [employer] fast streamer. Cement was then applied to the cut surfaces of the femur as well as along the posterior flanges of the size 7 femoral component. The femoral component was positioned and impacted into place. Excess cement was removed using [employer] fast streamer. A 5 mm polyethylene trial was inserted and the knee was brought into full extension with steady axial compression applied. Finally, cement was applied to the backside of a 41 mm medialized dome patella and the patellar component was positioned and patellar clamp applied. Excess cement was removed using [employer] fast streamer. After adequate curing of the cement, the tourniquet was deflated after a total tourniquet time of 122 minutes. Hemostasis was achieved  using electrocautery. The knee was irrigated with copious amounts of normal saline with antibiotic solution using pulsatile lavage and then suctioned dry. 20 mL of 1.3% Exparel in 40 mL of normal saline was injected along the posterior capsule, medial and lateral gutters, and along the arthrotomy site. A 5 mm stabilized rotating platform polyethylene insert was inserted and the knee was placed through a range of motion with excellent mediolateral soft tissue balancing appreciated and excellent patellar tracking noted. 2 medium drains were placed in the wound bed and brought out through separate stab incisions to be attached to a reinfusion system. The medial parapatellar portion of the incision was reapproximated using interrupted sutures of #1 Vicryl. Subcutaneous tissue was then injected with a total of 30 cc of 0.25% Marcaine with epinephrine. Subcutaneous tissue was approximated in layers using first #0 Vicryl followed #2-0 Vicryl. The skin was approximated with skin staples. A sterile dressing was applied.  The patient tolerated the procedure well and was transported to the recovery room in stable condition.    James P. Holley Bouche., M.D.

## 2015-04-28 ENCOUNTER — Encounter
Admission: RE | Admit: 2015-04-28 | Discharge: 2015-04-28 | Disposition: A | Discharge status: Home / Self Care | Payer: PPO | Source: Ambulatory Visit | Attending: Internal Medicine | Admitting: Internal Medicine

## 2015-04-28 ENCOUNTER — Encounter: Payer: Self-pay | Admitting: Orthopedic Surgery

## 2015-04-28 DIAGNOSIS — E871 Hypo-osmolality and hyponatremia: Secondary | ICD-10-CM | POA: Insufficient documentation

## 2015-04-28 LAB — CBC
HCT: 39.8 % (ref 35.0–47.0)
Hemoglobin: 13.4 g/dL (ref 12.0–16.0)
MCH: 29.8 pg (ref 26.0–34.0)
MCHC: 33.7 g/dL (ref 32.0–36.0)
MCV: 88.3 fL (ref 80.0–100.0)
Platelets: 153 10*3/uL (ref 150–440)
RBC: 4.5 MIL/uL (ref 3.80–5.20)
RDW: 13.8 % (ref 11.5–14.5)
WBC: 8.6 10*3/uL (ref 3.6–11.0)

## 2015-04-28 LAB — BASIC METABOLIC PANEL
Anion gap: 5 (ref 5–15)
BUN: 12 mg/dL (ref 6–20)
CO2: 28 mmol/L (ref 22–32)
Calcium: 8.7 mg/dL — ABNORMAL LOW (ref 8.9–10.3)
Chloride: 103 mmol/L (ref 101–111)
Creatinine, Ser: 0.79 mg/dL (ref 0.44–1.00)
GFR calc Af Amer: >60 mL/min (ref >60)
GFR calc non Af Amer: >60 mL/min (ref >60)
Glucose, Bld: 124 mg/dL — ABNORMAL HIGH (ref 65–99)
Potassium: 4.3 mmol/L (ref 3.5–5.1)
Sodium: 136 mmol/L (ref 135–145)

## 2015-04-28 MED ORDER — TAPENTADOL HCL 50 MG PO TABS: 50.0000 mg | ORAL_TABLET | ORAL | Status: DC | PRN

## 2015-04-28 MED ORDER — TRAMADOL HCL 50 MG PO TABS: 50.0000 mg | ORAL_TABLET | ORAL | Status: DC | PRN

## 2015-04-28 MED ORDER — ENOXAPARIN SODIUM 30 MG/0.3ML ~~LOC~~ SOLN: 30.0000 mg | Freq: Two times a day (BID) | SUBCUTANEOUS | Status: DC

## 2015-04-28 NOTE — Anesthesia Postprocedure Evaluation (Signed)
Anesthesia Post Note  Patient: Brianna Dalton  Procedure(s) Performed: Procedure(s) (LRB): COMPUTER ASSISTED TOTAL KNEE ARTHROPLASTY (Right)  Patient location during evaluation: Nursing Unit Anesthesia Type: Spinal Level of consciousness: awake and alert Pain management: satisfactory to patient Vital Signs Assessment: post-procedure vital signs reviewed and stable Respiratory status: spontaneous breathing Cardiovascular status: blood pressure returned to baseline Postop Assessment: no headache Anesthetic complications: no    Last Vitals:  Filed Vitals:   04/28/15 0343 04/28/15 0739  BP: 127/71 127/69  Pulse: 76 58  Temp: 36.3 C 36.3 C  Resp: 17 18    Last Pain:  Filed Vitals:   04/28/15 0739  PainSc: 5                  Brantley Fling

## 2015-04-28 NOTE — Clinical Social Work Placement (Signed)
   CLINICAL SOCIAL WORK PLACEMENT  NOTE  Date:  04/28/2015  Patient Details  Name: Brianna Dalton MRN: [id number] Date of Birth: Oct 02, 1945  Clinical Social Work is seeking post-discharge placement for this patient at the [place] level of care (*CSW will initial, date and re-position this form in  chart as items are completed):  Yes   Patient/family provided with [place] Work Department's list of facilities offering this level of care within the geographic area requested by the patient (or if unable, by the patient's family).  Yes   Patient/family informed of their freedom to choose among providers that offer the needed level of care, that participate in Medicare, Medicaid or managed care program needed by the patient, have an available bed and are willing to accept the patient.  Yes   Patient/family informed of [place]'s ownership interest in [facility] and [facility], as well as of the fact that they are under no obligation to receive care at these facilities.  PASRR submitted to EDS on 04/28/15     PASRR number received on 04/28/15     Existing PASRR number confirmed on       FL2 transmitted to all facilities in geographic area requested by pt/family on 04/28/15     FL2 transmitted to all facilities within larger geographic area on       Patient informed that his/her managed care company has contracts with or will negotiate with certain facilities, including the following:            Patient/family informed of bed offers received.  Patient chooses bed at       Physician recommends and patient chooses bed at      Patient to be transferred to   on  .  Patient to be transferred to facility by       Patient family notified on   of transfer.  Name of family member notified:        PHYSICIAN       Additional Comment:    _______________________________________________ Loralyn Freshwater, LCSW 04/28/2015, 11:29 AM

## 2015-04-28 NOTE — Care Management Note (Signed)
Case Management Note  Patient Details  Name: Brianna Dalton MRN: [id number] Date of Birth: 09-13-1945  Subjective/Objective:                  Met briefly with patient as PT was recommending SNF but potential to home with home health PT. I left list of home health agencies with patient. Needs rolling walker.   Action/Plan: RNCM will continue to follow.   Expected Discharge Date:  04/30/15               Expected Discharge Plan:     In-House Referral:  Clinical Social Work  Discharge planning Services  CM Consult  Post Acute Care Choice:  Home Health Choice offered to:  Patient  DME Arranged:    DME Agency:     HH Arranged:    [place] Agency:     Status of Service:  In process, will continue to follow  Medicare Important Message Given:    Date Medicare IM Given:    Medicare IM give by:    Date Additional Medicare IM Given:    Additional Medicare Important Message give by:     If discussed at [place] of Stay Meetings, dates discussed:    Additional Comments:  Marshell Garfinkel, RN 04/28/2015, 11:59 AM

## 2015-04-28 NOTE — Clinical Social Work Placement (Signed)
   CLINICAL SOCIAL WORK PLACEMENT  NOTE  Date:  04/28/2015  Patient Details  Name: Brianna Dalton MRN: [id number] Date of Birth: 04/17/1945  Clinical Social Work is seeking post-discharge placement for this patient at the [place] level of care (*CSW will initial, date and re-position this form in  chart as items are completed):  Yes   Patient/family provided with [place] Work Department's list of facilities offering this level of care within the geographic area requested by the patient (or if unable, by the patient's family).  Yes   Patient/family informed of their freedom to choose among providers that offer the needed level of care, that participate in Medicare, Medicaid or managed care program needed by the patient, have an available bed and are willing to accept the patient.  Yes   Patient/family informed of [place]'s ownership interest in [facility] and [facility], as well as of the fact that they are under no obligation to receive care at these facilities.  PASRR submitted to EDS on 04/28/15     PASRR number received on 04/28/15     Existing PASRR number confirmed on       FL2 transmitted to all facilities in geographic area requested by pt/family on 04/28/15     FL2 transmitted to all facilities within larger geographic area on       Patient informed that his/her managed care company has contracts with or will negotiate with certain facilities, including the following:        Yes   Patient/family informed of bed offers received.  Patient chooses bed at  [facility] )     Physician recommends and patient chooses bed at      Patient to be transferred to   on  .  Patient to be transferred to facility by       Patient family notified on   of transfer.  Name of family member notified:        PHYSICIAN       Additional Comment:    _______________________________________________ Loralyn Freshwater, LCSW 04/28/2015,  11:40 AM

## 2015-04-28 NOTE — Progress Notes (Signed)
Pt tolerated sitting on side of bed. Up to bedside commode also.

## 2015-04-28 NOTE — Clinical Social Work Note (Signed)
Clinical Social Work Assessment  Patient Details  Name: Brianna Dalton MRN: [id number] Date of Birth: 1945-11-25  Date of referral:  04/28/15               Reason for consult:  Facility Placement                Permission sought to share information with:  Chartered certified accountant granted to share information::  Yes, Verbal Permission Granted  Name::      [place]::   [place]   Relationship::     Contact Information:     Housing/Transportation Living arrangements for the past 2 months:  [place] of Information:  Patient Patient Interpreter Needed:  None Criminal Activity/Legal Involvement Pertinent to Current Situation/Hospitalization:  No - Comment as needed Significant Relationships:  Siblings, Friend Lives with:  Self Do you feel safe going back to the place where you live?  Yes Need for family participation in patient care:  Yes (Comment)  Care giving concerns:  Patient lives alone in [place].   Social Worker assessment / plan: Holiday representative (CSW) received SNF consult. PT is recommending SNF. CSW met with patient and her friend Jackelyn Poling was at bedside. CSW introduced self and explained role of CSW department. Patient reported that she lives alone and wants to go to [facility] for rehab. CSW explained that patient's insurance Health Team will have to approve SNF stay. Patient verbalized her understanding.   CSW presented bed offers. Patient chose [place]. Admissions coordinator at [facility] is aware of accepted bed offer. [facility] Team case manager is aware of above. Patient will likely D/C to [facility] tomorrow. CSW will continue to follow and assist as needed.    Employment status:  Retired Nurse, adult PT Recommendations:  [place] / Referral to community resources:  [place]  Patient/Family's Response to care:  Patient is  agreeable to going to [place] for rehab.   Patient/Family's Understanding of and Emotional Response to Diagnosis, Current Treatment, and Prognosis:  Patient was pleasant and thanked CSW for visit.   Emotional Assessment Appearance:  Appears stated age Attitude/Demeanor/Rapport:    Affect (typically observed):  Accepting, Adaptable, Pleasant Orientation:  Oriented to Self, Oriented to Place, Oriented to  Time, Oriented to Situation Alcohol / Substance use:  Not Applicable Psych involvement (Current and /or in the community):  No (Comment)  Discharge Needs  Concerns to be addressed:  Discharge Planning Concerns Readmission within the last 30 days:  No Current discharge risk:  Dependent with Mobility Barriers to Discharge:  Continued Medical Work up   Loralyn Freshwater, LCSW 04/28/2015, 11:31 AM

## 2015-04-28 NOTE — Evaluation (Signed)
Physical Therapy Evaluation Patient Details Name: Brianna Dalton MRN: [id number] DOB: [birth year] Today's Date: 04/28/2015   History of Present Illness  Pt is a pleasant 70 y.o. s/p R TKA on 04/27/15. Pt has hx of HTN, hypothyroid, angina pectoris syndrome, and neuropathy. Pt had previous L TKA.   Clinical Impression  Pt is a pleasant 70 y.o. S/p R TKA POD#1. Pt has hx of HTN, hypothyroidism, angina pectoris syndrome, and neuropathy. Pt has previous L TKA, and reports no current issues w/ function in L knee. Prior to admission, pt lived alone in 1st story apartment. Pt was able to perform all ADLs independently and worked regularly. Pt has sister in area who works during the day. Pt awake and oriented during eval. Pt demonstrates good B UE and L LE strength. Pt able to perform bed mobility with mod assist and use of railings. Pt able to perform sit to stand transfer with mod assist and RW. Pt required heavy cueing during transfer for hand and foot placement. Pt able to ambulate approx. 3 ft using RW and mod assist. Pt able to perform seated there-ex x10 reps with mod to no assist. Pt demonstrated significant increase in pain during exercises. Pt motivated to participate in PT. Pt demonstrates deficits in strength, ROM, and mobility. Pt would benefit from further skilled therapy to address deficits and return to PLOF; recommend pt sent to SNF after discharge from acute hospitalization.     Follow Up Recommendations SNF    Equipment Recommendations  Rolling walker with 5" wheels (if patient discharged home)    Recommendations for Other Services       Precautions / Restrictions Precautions Precautions: Knee Required Braces or Orthoses:  (Pt able to complete x10 SLR, no KI required.) Restrictions Weight Bearing Restrictions: Yes RLE Weight Bearing: Weight bearing as tolerated      Mobility  Bed Mobility Overal bed mobility: Needs Assistance Bed Mobility: Supine to Sit     Supine to sit:  Mod assist     General bed mobility comments: pt. up in chair upon arrival  Transfers Overall transfer level: Needs assistance Equipment used: Rolling walker (2 wheeled) Transfers: Sit to/from Stand Sit to Stand: Mod assist         General transfer comment: Pt able to perform sit to stand from [facility] with use of walker and mod assist. Pt provided heavy cues regarding hand and foot placement. Once standing, pt required increased time for weight shifting and stability.   Ambulation/Gait Ambulation/Gait assistance: Mod assist Ambulation Distance (Feet): 3 Feet Assistive device: Rolling walker (2 wheeled) Gait Pattern/deviations: Step-to pattern Gait velocity: slow   General Gait Details: Pt able to ambulate to chair using RW and mod assist. Pt required cueing regarding foot and walker placement t/o ambulation. Pt demonstrated increased weight bearing through B UE during gait.   Stairs            Wheelchair Mobility    Modified Rankin (Stroke Patients Only)       Balance Overall balance assessment: Modified Independent (use of railings/bed)                                           Pertinent Vitals/Pain Pain Assessment: 0-10 Pain Score: 8  Pain Location: Right knee Pain Descriptors / Indicators: Operative site guarding Pain Intervention(s): Patient requesting pain meds-RN notified;Limited activity within patient's tolerance  Home Living Family/patient expects to be discharged to:: Skilled nursing facility Living Arrangements: Alone Available Help at Discharge: Family (sister) Type of Home: Apartment Home Access: Ramped entrance     Home Layout: One level Home Equipment: Walker - standard;Cane - single point Additional Comments: Pt. was able to drive, was working as a Secretary/administrator.    Prior Function Level of Independence: Independent         Comments: Prior to admission, pt worked and performed all ADLs independently.      Hand  Dominance   Dominant Hand: Right    Extremity/Trunk Assessment   Upper Extremity Assessment: Overall WFL for tasks assessed RUE Deficits / Details: R UE grossly 5/5      LUE Deficits / Details: L UE grossly 5/5   Lower Extremity Assessment: RLE deficits/detail;LLE deficits/detail RLE Deficits / Details: R foot grossly 4/5 LLE Deficits / Details: L LE grossly 4+/5     Communication   Communication: No difficulties  Cognition Arousal/Alertness: Awake/alert Behavior During Therapy: WFL for tasks assessed/performed Overall Cognitive Status: Within Functional Limits for tasks assessed                      General Comments      Exercises Total Joint Exercises Goniometric ROM: R LE AAROM 7-64 degrees Other Exercises Other Exercises: Pt able to perform seated ther-ex on R LE including LAQ, heel slides with mod asssit, SLR and quad sets with min assist, and ankle pumps w/no assist. Pt became tearful during ther-ex due to pain in R LE. All ther-ex performed x10 reps.      Assessment/Plan    PT Assessment Patient needs continued PT services  PT Diagnosis Difficulty walking;Abnormality of gait;Generalized weakness;Acute pain   PT Problem List Decreased strength;Decreased range of motion;Decreased activity tolerance;Decreased mobility;Pain  PT Treatment Interventions Gait training;Therapeutic exercise;DME instruction   PT Goals (Current goals can be found in the Care Plan section) Acute Rehab PT Goals Patient Stated Goal: Pt. regain independence PT Goal Formulation: With patient Time For Goal Achievement: 05/12/15 Potential to Achieve Goals: Good    Frequency BID   Barriers to discharge Decreased caregiver support Pt lives alone with occasional support from sister.     Co-evaluation               End of Session Equipment Utilized During Treatment: Gait belt Activity Tolerance: Patient limited by pain Patient left: in chair;with chair alarm set;with  family/visitor present;with nursing/sitter in room Nurse Communication: Mobility status         Time: [id number] PT Time Calculation (min) (ACUTE ONLY): 27 min   Charges:   PT Evaluation $PT Eval Moderate Complexity: 1 Procedure PT Treatments $Therapeutic Exercise: 8-22 mins   PT G Codes:        Sherral Hammers 2015/05/22, 12:53 PM M. Barnett Abu, SPT

## 2015-04-28 NOTE — Evaluation (Signed)
Occupational Therapy Evaluation Patient Details Name: Brianna Dalton MRN: [id number] DOB: Jan 28, 1945 Today's Date: 04/28/2015    History of Present Illness  Pt. Is a 70 y.o. Female who was  Admitted to [facility] for a Right TKR.   Clinical Impression   Pt is 70 year old Female s/p right TKR.  Pt was independent in all ADLs/IADLs prior to surgery and is eager to return to her PLOF.  Pt. Has a solid understanding of the proper use of A/E for LE dressing, as pt. used A/E following LLE surgery in the past. Pt. Is limited by pain and decreased AROM of right knee. Pt. Requires cues for participation following PT. Pt would benefit from OT services for  ADL/IADL retraining, functional mobility, and work simplification techniques.  Pt. Plans to go to SNF at discharge.     Follow Up Recommendations  SNF    Equipment Recommendations       Recommendations for Other Services       Precautions / Restrictions Restrictions Weight Bearing Restrictions: Yes RLE Weight Bearing: Weight bearing as tolerated      Mobility Bed Mobility               General bed mobility comments: pt. up in chair upon arrival  Transfers                      Balance                                            ADL Overall ADL's : Needs assistance/impaired                                       General ADL Comments: Pt. reports knowing how to use the A/E for self-dressing from her previous surgery on the LLE. Pt. was able to demonstrate knowledge of proper use of A/E for LE dressing.      Vision     Perception     Praxis      Pertinent Vitals/Pain Pain Assessment: 0-10 Pain Score: 8  Pain Location: Right knee     Hand Dominance Right   Extremity/Trunk Assessment Upper Extremity Assessment Upper Extremity Assessment: Overall WFL for tasks assessed           Communication Communication Communication: No difficulties   Cognition Arousal/Alertness:  Awake/alert   Overall Cognitive Status: Within Functional Limits for tasks assessed                     General Comments       Exercises       Shoulder Instructions      Home Living Family/patient expects to be discharged to:: Skilled nursing facility Living Arrangements: Alone                               Additional Comments: Pt. was able to drive, was working as a Secretary/administrator.      Prior Functioning/Environment Level of Independence: Independent             OT Diagnosis: Generalized weakness   OT Problem List: Decreased strength;Decreased activity tolerance;Obesity   OT Treatment/Interventions: Self-care/ADL training;DME and/or AE instruction;Patient/family education;Therapeutic exercise  OT Goals(Current goals can be found in the care plan section) Acute Rehab OT Goals Patient Stated Goal: Pt. regain independence OT Goal Formulation: With patient Time For Goal Achievement: 05/12/15 Potential to Achieve Goals: Good  OT Frequency: Min 1X/week   Barriers to D/C:            Co-evaluation              End of Session    Activity Tolerance: Patient limited by pain Patient left: in chair;with family/visitor present;with chair alarm set;with call bell/phone within reach   Time: 1120-1135 OT Time Calculation (min): 15 min Charges:  OT General Charges $OT Visit: 1 Procedure OT Evaluation $OT Eval Low Complexity: 1 Procedure G-Codes:    Harrel Carina, MS, OTR/L Harrel Carina 04/28/2015, 11:51 AM

## 2015-04-28 NOTE — Progress Notes (Signed)
Physical Therapy Treatment Patient Details Name: Brianna Dalton MRN: [id number] DOB: 04/29/1945 Today's Date: 04/28/2015    History of Present Illness Pt is a pleasant 70 y.o. s/p R TKA on 04/27/15. Pt has hx of HTN, hypothyroid, angina pectoris syndrome, and neuropathy. Pt had previous L TKA.     PT Comments    Pt is progressing towards goals. Pt able to perform bed mobility with use of railings and mod assist for R LE. Pt able to perform sit to stand transfer with RW and mod assist. Pt able to ambulate in room using RW and mod assist, demonstrating slow step-to gait pattern. Ambulation limited by pt experiencing dizziness. Pt performed supine there-ex x10 reps with mod to no assist. Pt required assist w/ toileting in order to safely sit on/rise from commode, perform hygiene and flushing. Pt motivated to participate in PT. Pt demonstrates deficits in strength, ROM and mobility. Pt would benefit from further skilled therapy to address deficits; recommend pt sent to SNF following discharge from acute hospitalization.   Follow Up Recommendations  SNF     Equipment Recommendations  Rolling walker with 5" wheels    Recommendations for Other Services       Precautions / Restrictions Precautions Precautions: Knee Required Braces or Orthoses:  (Pt able to complete x10 SLR, no KI required.) Restrictions Weight Bearing Restrictions: Yes RLE Weight Bearing: Weight bearing as tolerated    Mobility  Bed Mobility Overal bed mobility: Needs Assistance Bed Mobility: Sit to Supine     Supine to sit: Mod assist     General bed mobility comments: Pt able to perform bed mobility with mod assist and use of railings. Pt required assist moving R LE into adduction.   Transfers Overall transfer level: Needs assistance Equipment used: Rolling walker (2 wheeled) Transfers: Sit to/from Stand Sit to Stand: Mod assist         General transfer comment: Pt able to perform sit to stand with RW and  mod assist. Pt provided heavy cues regarding hand and foot placement.   Ambulation/Gait Ambulation/Gait assistance: Mod assist Ambulation Distance (Feet): 30 Feet Assistive device: Rolling walker (2 wheeled) Gait Pattern/deviations: Step-to pattern Gait velocity: slow   General Gait Details: Pt able to ambulate in room using RW and mod assist. Pt required cueing regarding foot and walker placement t/o ambulation. Pt demonstrated increased weight bearing through B UE during gait.    Stairs            Wheelchair Mobility    Modified Rankin (Stroke Patients Only)       Balance Overall balance assessment: Modified Independent (use of railings/bed)                                  Cognition Arousal/Alertness: Awake/alert Behavior During Therapy: WFL for tasks assessed/performed Overall Cognitive Status: Within Functional Limits for tasks assessed                      Exercises Total Joint Exercises Goniometric ROM: R LE AAROM 7-64 degrees Other Exercises Other Exercises: Pt able to perform supine ther-ex on R LE including SAQ and SLR with mod assist, quad sets and ankle pumps w/no assist. Pt expressed increase in pain during ther-ex. Provided pt w/cues for breathing during ex. All ther-ex performed x10 reps.  Other Exercises: Pt performed toileting with RW and min assist including assist w/ sitting/standing from commode,  assist w/gown. Pt impulsive, trying to stand before PTs instruction. Pt demonstrated unsteadiness w/hygeine and flushing.     General Comments        Pertinent Vitals/Pain Pain Assessment: 0-10 Pain Score: 7  Pain Location: R Knee Pain Descriptors / Indicators: Operative site guarding Pain Intervention(s): Limited activity within patient's tolerance    Home Living Family/patient expects to be discharged to:: Skilled nursing facility Living Arrangements: Alone Available Help at Discharge: Family (sister) Type of Home:  Apartment Home Access: Ramped entrance   Home Layout: One level Home Equipment: Walker - standard;Cane - single point Additional Comments: Pt. was able to drive, was working as a Secretary/administrator.    Prior Function Level of Independence: Independent      Comments: Prior to admission, pt worked and performed all ADLs independently.    PT Goals (current goals can now be found in the care plan section) Acute Rehab PT Goals Patient Stated Goal: Return to being independent w/ ADLs, return to work PT Goal Formulation: With patient Time For Goal Achievement: 05/12/15 Potential to Achieve Goals: Good Progress towards PT goals: Progressing toward goals    Frequency  BID    PT Plan Current plan remains appropriate    Co-evaluation             End of Session Equipment Utilized During Treatment: Gait belt Activity Tolerance: Patient limited by pain Patient left: in bed;with bed alarm set;with family/visitor present;with SCD's reapplied     Time: [id number] PT Time Calculation (min) (ACUTE ONLY): 24 min  Charges:  $Therapeutic Exercise: 8-22 mins                    G Codes:      Sherral Hammers 05/15/2015, 3:20 PM M. Barnett Abu, SPT

## 2015-04-28 NOTE — Progress Notes (Addendum)
Health Team authorization has been received. Auth # [id number]. Patient is aware of above. Admissions coordinator at [facility] is aware of above. Plan is for patient to D/C tomorrow 04/29/15 to [facility] pending medical clearance. CSW will continue to follow and assist as needed.   Blima Rich, LCSW [phone]

## 2015-04-28 NOTE — Progress Notes (Signed)
   Subjective: 1 Day Post-Op Procedure(s) (LRB): COMPUTER ASSISTED TOTAL KNEE ARTHROPLASTY (Right) Patient reports pain as 5 on 0-10 scale.   Patient is well, and has had no acute complaints or problems We will start therapy today.  Plan is to go Rehab after hospital stay. no nausea and no vomiting Patient denies any chest pains or shortness of breath. Objective: Vital signs in last 24 hours: Temp:  [97.2 F (36.2 C)-98 F (36.7 C)] 97.4 F (36.3 C) (04/06 0343) Pulse Rate:  [65-83] 76 (04/06 0343) Resp:  [9-19] 17 (04/06 0343) BP: (125-156)/(59-94) 127/71 mmHg (04/06 0343) SpO2:  [96 %-100 %] 99 % (04/06 0343) Weight:  [127.007 kg (280 lb)] 127.007 kg (280 lb) (04/05 1023) Heels are non tender and elevated off the bed using rolled towels Intake/Output from previous day: 04/05 0701 - 04/06 0700 In: 3843.7 [I.V.:3331.7] Out: 3935 [Urine:3445; Drains:465; Blood:25] Intake/Output this shift:     Recent Labs  04/28/15 0614  HGB 13.4    Recent Labs  04/28/15 0614  WBC 8.6  RBC 4.50  HCT 39.8  PLT PENDING    Recent Labs  04/28/15 0614  NA 136  K 4.3  CL 103  CO2 28  BUN 12  CREATININE 0.79  GLUCOSE 124*  CALCIUM 8.7*   No results for input(s): LABPT, INR in the last 72 hours.  EXAM General - Patient is Alert, Appropriate and Oriented Extremity - Neurologically intact Neurovascular intact Sensation intact distally Intact pulses distally Dorsiflexion/Plantar flexion intact Dressing - dressing C/D/I Motor Function - intact, moving foot and toes well on exam. Able to do SLR on own  Past Medical History  Diagnosis Date  . Hypothyroidism   . Hypertension   . Angina pectoris syndrome ([place])   . Arthritis   . Varicose vein of leg   . Arterial hemorrhage     Lt ankle possible venous bleed  . Neuropathy ([facility])     Assessment/Plan: 1 Day Post-Op Procedure(s) (LRB): COMPUTER ASSISTED TOTAL KNEE ARTHROPLASTY (Right) Active Problems:   S/P total knee  arthroplasty  Estimated body mass index is 41.33 kg/(m^2) as calculated from the following:   Height as of this encounter: 5\' 9"  (1.753 m).   Weight as of this encounter: 127.007 kg (280 lb). Advance diet Up with therapy D/C IV fluids Plan for discharge tomorrow Discharge to SNF  Labs: reviewed DVT Prophylaxis - Lovenox, Foot Pumps and TED hose Weight-Bearing as tolerated to right leg D/C O2 and Pulse OX and try on Room Air Being working on a bowel movement Labs in am  [employer]. [place] 04/28/2015, 7:14 AM

## 2015-04-28 NOTE — NC FL2 (Signed)
[place] LEVEL OF CARE SCREENING TOOL     IDENTIFICATION  Patient Name: Brianna Dalton Birthdate: 27-Jul-1945 Sex: female Admission Date (Current Location): 04/27/2015  [place] and [place] Number:  Engineering geologist and Address:  [facility], [address], [place], [place] [address]      Provider Number: [id number]  Attending Physician Name and Address:  Dereck Leep, MD  Relative Name and Phone Number:       Current Level of Care: Hospital Recommended Level of Care: [place] Prior Approval Number:    Date Approved/Denied:   PASRR Number:  ([id number])  Discharge Plan: SNF    Current Diagnoses: Patient Active Problem List   Diagnosis Date Noted  . 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 S/P total knee arthroplasty 04/27/2015  . Lumbar radiculopathy 07/07/2013  . Acid reflux 07/05/2013  . BP (high blood pressure) 07/05/2013  . HLD (hyperlipidemia) 07/05/2013  . Adult hypothyroidism 07/05/2013  . Morbid obesity ([place]) 07/05/2013  . Impaired renal function 07/05/2013    Orientation RESPIRATION BLADDER Height & Weight     Self, Situation, Time, Place  Normal Continent Weight: 280 lb (127.007 kg) Height:  5\' 9"  (175.3 cm)  BEHAVIORAL SYMPTOMS/MOOD NEUROLOGICAL BOWEL NUTRITION STATUS   (none )  (none ) Continent Diet (Diet: Regular )  AMBULATORY STATUS COMMUNICATION OF NEEDS Skin   Extensive Assist Verbally Surgical wounds (Incision: Right Knee )                       Personal Care Assistance Level of Assistance  Bathing, Feeding, Dressing Bathing Assistance: Limited assistance Feeding assistance: Independent Dressing Assistance: Limited assistance     Functional Limitations Info  Sight, Hearing, Speech Sight Info: Impaired Hearing Info: Adequate Speech Info: Adequate    SPECIAL CARE FACTORS FREQUENCY  PT (By licensed PT), OT (By licensed OT)     PT Frequency:  (5) OT Frequency:  (5)             Contractures      Additional Factors Info  Code Status, Allergies Code Status Info:  (Not on File ) Allergies Info:  (Hydrocodone, Latex, Morphine And Related, Penicillins)           Current Medications (04/28/2015):  This is the current hospital active medication list Current Facility-Administered Medications  Medication Dose Route Frequency Provider Last Rate Last Dose  . 0.9 %  sodium chloride infusion   Intravenous Continuous Dereck Leep, MD 100 mL/hr at 04/28/15 0548    . acetaminophen (OFIRMEV) IV 1,000 mg  1,000 mg Intravenous 4 times per day Dereck Leep, MD   1,000 mg at 04/28/15 0548  . acetaminophen (TYLENOL) tablet 650 mg  650 mg Oral Q6H PRN Dereck Leep, MD       Or  . acetaminophen (TYLENOL) suppository 650 mg  650 mg Rectal Q6H PRN Dereck Leep, MD      . alum & mag hydroxide-simeth (MAALOX/MYLANTA) 200-200-20 MG/5ML suspension 30 mL  30 mL Oral Q4H PRN Dereck Leep, MD      . bisacodyl (DULCOLAX) suppository 10 mg  10 mg Rectal Daily PRN Dereck Leep, MD      . celecoxib (CELEBREX) capsule 200 mg  200 mg Oral Q12H Dereck Leep, MD   200 mg at 04/28/15 0931  . clindamycin (CLEOCIN) IVPB 600 mg  600 mg Intravenous Q6H Dereck Leep, MD   600 mg at  04/28/15 0548  . diphenhydrAMINE (BENADRYL) 12.5 MG/5ML elixir 12.5-25 mg  12.5-25 mg Oral Q4H PRN Dereck Leep, MD   25 mg at 04/28/15 0930  . enalapril (VASOTEC) tablet 20 mg  20 mg Oral Daily Dereck Leep, MD   20 mg at 04/28/15 0930  . enoxaparin (LOVENOX) injection 30 mg  30 mg Subcutaneous Q12H Dereck Leep, MD   30 mg at 04/28/15 0931  . ferrous sulfate tablet 325 mg  325 mg Oral BID WC Dereck Leep, MD   325 mg at 04/28/15 0931  . HYDROmorphone (DILAUDID) injection 1 mg  1 mg Intravenous Q2H PRN Dereck Leep, MD      . levothyroxine (SYNTHROID, LEVOTHROID) tablet 150 mcg  150 mcg Oral QAC breakfast Dereck Leep, MD   150 mcg at 04/28/15 0931  . magnesium hydroxide (MILK OF MAGNESIA)  suspension 30 mL  30 mL Oral Daily PRN Dereck Leep, MD      . menthol-cetylpyridinium (CEPACOL) lozenge 3 mg  1 lozenge Oral PRN Dereck Leep, MD       Or  . phenol (CHLORASEPTIC) mouth spray 1 spray  1 spray Mouth/Throat PRN Dereck Leep, MD      . metoCLOPramide (REGLAN) tablet 10 mg  10 mg Oral TID AC & HS Dereck Leep, MD   10 mg at 04/28/15 0931  . ondansetron (ZOFRAN) tablet 4 mg  4 mg Oral Q6H PRN Dereck Leep, MD       Or  . ondansetron (ZOFRAN) injection 4 mg  4 mg Intravenous Q6H PRN Dereck Leep, MD   4 mg at 04/27/15 1807  . pantoprazole (PROTONIX) EC tablet 40 mg  40 mg Oral BID Dereck Leep, MD   40 mg at 04/28/15 0931  . senna-docusate (Senokot-S) tablet 1 tablet  1 tablet Oral BID Dereck Leep, MD   1 tablet at 04/28/15 0931  . sodium phosphate (FLEET) 7-19 GM/118ML enema 1 enema  1 enema Rectal Once PRN Dereck Leep, MD      . tapentadol (NUCYNTA) tablet 50-100 mg  50-100 mg Oral Q4H PRN Dereck Leep, MD   100 mg at 04/28/15 0931  . torsemide (DEMADEX) tablet 10 mg  10 mg Oral Daily Dereck Leep, MD   10 mg at 04/28/15 0930  . traMADol (ULTRAM) tablet 50-100 mg  50-100 mg Oral Q4H PRN Dereck Leep, MD   100 mg at 04/28/15 0548     Discharge Medications: Please see discharge summary for a list of discharge medications.     enoxaparin 30 MG/0.3ML injection  Commonly known as: LOVENOX  Inject 0.3 mLs (30 mg total) into the skin every 12 (twelve) hours.     levothyroxine 150 MCG tablet  Commonly known as: SYNTHROID, LEVOTHROID  Take 150 mcg by mouth daily before breakfast.     tapentadol 50 MG Tabs tablet  Commonly known as: NUCYNTA  Take 1-2 tablets (50-100 mg total) by mouth every 4 (four) hours as needed for moderate pain.     torsemide 10 MG tablet  Commonly known as: DEMADEX  Take 10 mg by mouth daily as needed.     traMADol 50 MG tablet  Commonly known as: ULTRAM  Take 50 mg by mouth every 6 (six) hours as  needed.     traMADol 50 MG tablet  Commonly known as: ULTRAM  Take 1-2 tablets (50-100 mg total) by mouth every 4 (four)  hours as needed for           Relevant Imaging Results:  Relevant Lab Results:   Additional Information  (SSN: [id number])  Loralyn Freshwater, LCSW

## 2015-04-28 NOTE — Progress Notes (Signed)
Marylee Floras NP notified of rash - left upper arm and across abdomen. NP order to hold Cleocin.

## 2015-04-29 DIAGNOSIS — K449 Diaphragmatic hernia without obstruction or gangrene: Secondary | ICD-10-CM | POA: Diagnosis not present

## 2015-04-29 DIAGNOSIS — R531 Weakness: Secondary | ICD-10-CM | POA: Diagnosis not present

## 2015-04-29 DIAGNOSIS — M5416 Radiculopathy, lumbar region: Secondary | ICD-10-CM | POA: Diagnosis not present

## 2015-04-29 DIAGNOSIS — M6281 Muscle weakness (generalized): Secondary | ICD-10-CM | POA: Diagnosis not present

## 2015-04-29 DIAGNOSIS — R262 Difficulty in walking, not elsewhere classified: Secondary | ICD-10-CM | POA: Diagnosis not present

## 2015-04-29 DIAGNOSIS — E039 Hypothyroidism, unspecified: Secondary | ICD-10-CM | POA: Diagnosis not present

## 2015-04-29 DIAGNOSIS — Z96651 Presence of right artificial knee joint: Secondary | ICD-10-CM | POA: Diagnosis not present

## 2015-04-29 DIAGNOSIS — Z471 Aftercare following joint replacement surgery: Secondary | ICD-10-CM | POA: Diagnosis not present

## 2015-04-29 DIAGNOSIS — R52 Pain, unspecified: Secondary | ICD-10-CM | POA: Diagnosis not present

## 2015-04-29 DIAGNOSIS — K219 Gastro-esophageal reflux disease without esophagitis: Secondary | ICD-10-CM | POA: Diagnosis not present

## 2015-04-29 DIAGNOSIS — Z743 Need for continuous supervision: Secondary | ICD-10-CM | POA: Diagnosis not present

## 2015-04-29 DIAGNOSIS — E785 Hyperlipidemia, unspecified: Secondary | ICD-10-CM | POA: Diagnosis not present

## 2015-04-29 DIAGNOSIS — I10 Essential (primary) hypertension: Secondary | ICD-10-CM | POA: Diagnosis not present

## 2015-04-29 LAB — CBC
HCT: 34.5 % — ABNORMAL LOW (ref 35.0–47.0)
Hemoglobin: 11.6 g/dL — ABNORMAL LOW (ref 12.0–16.0)
MCH: 28.7 pg (ref 26.0–34.0)
MCHC: 33.7 g/dL (ref 32.0–36.0)
MCV: 85.3 fL (ref 80.0–100.0)
Platelets: 124 10*3/uL — ABNORMAL LOW (ref 150–440)
RBC: 4.05 MIL/uL (ref 3.80–5.20)
RDW: 13.5 % (ref 11.5–14.5)
WBC: 6.3 10*3/uL (ref 3.6–11.0)

## 2015-04-29 LAB — BASIC METABOLIC PANEL
Anion gap: 3 — ABNORMAL LOW (ref 5–15)
BUN: 15 mg/dL (ref 6–20)
CO2: 25 mmol/L (ref 22–32)
Calcium: 8.2 mg/dL — ABNORMAL LOW (ref 8.9–10.3)
Chloride: 103 mmol/L (ref 101–111)
Creatinine, Ser: 0.77 mg/dL (ref 0.44–1.00)
GFR calc Af Amer: >60 mL/min (ref >60)
GFR calc non Af Amer: >60 mL/min (ref >60)
Glucose, Bld: 131 mg/dL — ABNORMAL HIGH (ref 65–99)
Potassium: 4.1 mmol/L (ref 3.5–5.1)
Sodium: 131 mmol/L — ABNORMAL LOW (ref 135–145)

## 2015-04-29 NOTE — Discharge Instructions (Signed)

## 2015-04-29 NOTE — Care Management Important Message (Signed)
Important Message  Patient Details  Name: Brianna Dalton MRN: [id number] Date of Birth: October 29, 1945   Medicare Important Message Given:  Yes    [name] 04/29/2015, 9:50 AM

## 2015-04-29 NOTE — Progress Notes (Signed)
LCSW met with patient and confirmed she will be going to [facility], She will be transported by EMS. LCSW sent via hub discharge summary and signed FL2 and therapy notes to [facility].  [employer] LCSW [phone]

## 2015-04-29 NOTE — Progress Notes (Addendum)
LCSW completed the EMS form ( medical necessity form)  Simora Dingee LCSW [phone]

## 2015-04-29 NOTE — Discharge Summary (Signed)
Physician Discharge Summary  Patient ID: Brianna Dalton MRN: [id number] DOB/AGE: Jul 13, 1945 70 y.o.  Admit date: 04/27/2015 Discharge date: 04/29/2015  Admission Diagnoses:  OSTEOARTHRITIS   Discharge Diagnoses: Patient Active Problem List   Diagnosis Date Noted  . 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 S/P total knee arthroplasty 04/27/2015  . Lumbar radiculopathy 07/07/2013  . Acid reflux 07/05/2013  . BP (high blood pressure) 07/05/2013  . HLD (hyperlipidemia) 07/05/2013  . Adult hypothyroidism 07/05/2013  . Morbid obesity ([place]) 07/05/2013  . Impaired renal function 07/05/2013    Past Medical History  Diagnosis Date  . Hypothyroidism   . Hypertension   . Angina pectoris syndrome ([place])   . Arthritis   . Varicose vein of leg   . Arterial hemorrhage     Lt ankle possible venous bleed  . Neuropathy ([place])      Transfusion: Autovac transfusion given the first 6 hours postoperatively   Consultants (if any):   case management for assistance with placement  Discharged Condition: Improved  Hospital Course: Brianna Dalton is an 70 y.o. female who was admitted 04/27/2015 with a diagnosis of degenerative arthrosis right knee and went to the operating room on 04/27/2015 and underwent the above named procedures.    Surgeries:Procedure(s): COMPUTER ASSISTED TOTAL KNEE ARTHROPLASTY on 04/27/2015  PRE-OPERATIVE DIAGNOSIS: Degenerative arthrosis of the right knee, primary  POST-OPERATIVE DIAGNOSIS: Same  PROCEDURE: Right total knee arthroplasty using computer-assisted navigation  SURGEON: Marciano Sequin. M.D.  ASSISTANT: Vance Peper, PA (present and scrubbed throughout the case, critical for assistance with exposure, retraction, instrumentation, and closure)  ANESTHESIA: spinal  ESTIMATED BLOOD LOSS: 25 mL  FLUIDS REPLACED: 1600 mL of crystalloid  TOURNIQUET TIME: 122 minutes  DRAINS: 2 medium drains to a reinfusion system  SOFT TISSUE RELEASES: Anterior cruciate  ligament, posterior cruciate ligament, deep medial collateral ligament, patellofemoral ligament   IMPLANTS UTILIZED: DePuy Attune size 7 posterior stabilized femoral component (cemented), size 7 rotating platform tibial component (cemented), 41 mm medialized dome patella (cemented), and a 5 mm stabilized rotating platform polyethylene insert.  INDICATIONS FOR SURGERY: Brianna Dalton is a 70 y.o. year old female with a long history of progressive knee pain. X-rays demonstrated severe degenerative changes in tricompartmental fashion. The patient had not seen any significant improvement despite conservative nonsurgical intervention. After discussion of the risks and benefits of surgical intervention, the patient expressed understanding of the risks benefits and agree with plans for total knee arthroplasty.   The risks, benefits, and alternatives were discussed at length including but not limited to the risks of infection, bleeding, nerve injury, stiffness, blood clots, the need for revision surgery, cardiopulmonary complications, among others, and they were willing to proceed. Patient tolerated the surgery well. No complications .Patient was taken to PACU where she was stabilized and then transferred to the orthopedic floor.  Patient started on Lovenox 30 q 12 hrs. Foot pumps applied bilaterally at 80 mm hg. Heels elevated off bed with rolled towels. No evidence of DVT. Calves non tender. Negative [place]oman. Physical therapy started on day #1 for gait training and transfer with OT starting on  day #1 for ADL and assisted devices. Patient has done well with therapy. Ambulated 30 feet upon being discharged.  Patient's IV and Foley were discontinued on day #1 with the Hemovac being discontinued on day #2. Dressing was also changed on day #2 prior to being discharged   She was given perioperative antibiotics:  Anti-infectives    Start  Dose/Rate Route Frequency Ordered Stop   04/27/15 1730  clindamycin  (CLEOCIN) IVPB 600 mg     600 mg 100 mL/hr over 30 Minutes Intravenous Every 6 hours 04/27/15 1649 04/28/15 1729   04/27/15 0827  clindamycin (CLEOCIN) 900 MG/50ML IVPB    Comments:  Brianna Dalton: cabinet override      04/27/15 0827 04/27/15 1126    .  She was fitted with AV 1 compression foot pump devices, instructed on heel pumps, early ambulation, and TED stockings bilaterally for DVT prophylaxis.  She benefited maximally from the hospital stay and there were no complications.    Recent vital signs:  Filed Vitals:   04/28/15 2015 04/29/15 0353  BP: 179/97 133/69  Pulse: 117 105  Temp: 98.5 F (36.9 C) 98.6 F (37 C)  Resp: 18     Recent laboratory studies:  Lab Results  Component Value Date   HGB 11.6* 04/29/2015   HGB 13.4 04/28/2015   HGB 14.5 04/14/2015   Lab Results  Component Value Date   WBC 6.3 04/29/2015   PLT 124* 04/29/2015   Lab Results  Component Value Date   INR 1.10 04/14/2015   Lab Results  Component Value Date   NA 131* 04/29/2015   K 4.1 04/29/2015   CL 103 04/29/2015   CO2 25 04/29/2015   BUN 15 04/29/2015   CREATININE 0.77 04/29/2015   GLUCOSE 131* 04/29/2015    Discharge Medications:     Medication List    STOP taking these medications        aspirin 81 MG tablet      TAKE these medications        enalapril 20 MG tablet  Commonly known as:  VASOTEC  Take 20 mg by mouth daily.     enoxaparin 30 MG/0.3ML injection  Commonly known as:  LOVENOX  Inject 0.3 mLs (30 mg total) into the skin every 12 (twelve) hours.     levothyroxine 150 MCG tablet  Commonly known as:  SYNTHROID, LEVOTHROID  Take 150 mcg by mouth daily before breakfast.     tapentadol 50 MG Tabs tablet  Commonly known as:  NUCYNTA  Take 1-2 tablets (50-100 mg total) by mouth every 4 (four) hours as needed for moderate pain.     torsemide 10 MG tablet  Commonly known as:  DEMADEX  Take 10 mg by mouth daily as needed.     traMADol 50 MG tablet  Commonly  known as:  ULTRAM  Take 50 mg by mouth every 6 (six) hours as needed.     traMADol 50 MG tablet  Commonly known as:  ULTRAM  Take 1-2 tablets (50-100 mg total) by mouth every 4 (four) hours as needed for moderate pain.        Diagnostic Studies: Dg Knee Right Port  04/27/2015  CLINICAL DATA:  70 year old female status post right knee arthroplasty. Initial encounter. EXAM: PORTABLE RIGHT KNEE - 1-2 VIEW COMPARISON:  None. FINDINGS: AP and cross-table lateral views at 1527 hours. Sequelae of right total knee arthroplasty. Hardware appears intact and normally aligned. Two postoperative drains are in place. Overlying postoperative soft tissue changes. No unexpected osseous findings. IMPRESSION: Right knee arthroplasty with no adverse features. Electronically Signed   By: Genevie Ann M.D.   On: 04/27/2015 15:49    Disposition:   Patient may weight-bear as tolerated Change dressing as needed Staples are to be removed 2 weeks postop and apply benzoin and Steri-Strips do not get the incision  wet until the staples are removed Continue Lovenox 30 mg subcutaneous every 12 hours for 14 days then discontinue and begin taking one 81 mg enteric-coated aspirin per day unless contraindicated Patient should already have an appointment for 2 weeks. Patient may weight-bear as tolerated      Discharge Instructions    Diet - low sodium heart healthy    Complete by:  As directed      Diet - low sodium heart healthy    Complete by:  As directed      Increase activity slowly    Complete by:  As directed      Increase activity slowly    Complete by:  As directed            Follow-up Information    Follow up with Feliberto Gottron, PA-C On 05/13/2015.   Specialties:  Orthopedic Surgery, Emergency Medicine   Why:  at 9:45am   Contact information:   [place] [address] [phone]       Follow up with Dereck Leep, MD On 06/09/2015.   Specialty:  Orthopedic Surgery    Why:  at 10:30am   Contact information:   [address] [facility] [place] [address] [phone]       Follow up with [facility] .   Specialty:  [place] information:   [place] [phone]       Signed: Watt Climes. 04/29/2015, 7:17 AM

## 2015-04-29 NOTE — Progress Notes (Signed)
Physical Therapy Treatment Patient Details Name: Brianna Dalton MRN: [id number] DOB: 1945-03-13 Today's Date: 04/29/2015    History of Present Illness Pt is a pleasant 70 y.o. s/p R TKA on 04/27/15. Pt has hx of HTN, hypothyroid, angina pectoris syndrome, and neuropathy. Pt had previous L TKA.     PT Comments    Pt is progressing towards goals. Pt able to perform sit to stand from chair and commode w/ RW and CGA. Pt able to ambulate approx. 60 ft using RW and mod assist. Pt provided heavy cues regarding foot and walker placement. Ambulation limited by dizziness and fatigue. Pt required use of RW and mod assist for toileting. Pt performed seated there-ex with mod to no assist. Pt motivated to participate in PT. Pt demonstrates deficits in strength, ROM and mobility. Pt would benefit from further skilled therapy to address deficits and return to PLOF; recommend pt sent to SNF after discharge from acute hospitalization.   Follow Up Recommendations  SNF     Equipment Recommendations  Rolling walker with 5" wheels    Recommendations for Other Services       Precautions / Restrictions Precautions Precautions: Knee Restrictions Weight Bearing Restrictions: Yes RLE Weight Bearing: Weight bearing as tolerated    Mobility  Bed Mobility                  Transfers Overall transfer level: Needs assistance Equipment used: Rolling walker (2 wheeled) Transfers: Sit to/from Stand Sit to Stand: Mod assist         General transfer comment: Pt able to perform sit to stand using RW and mod assist. Pt provided cues regarding hand and foot placement. Upon standing, pt need increased time to weight bear on R LE.  Ambulation/Gait Ambulation/Gait assistance: Mod assist Ambulation Distance (Feet): 60 Feet Assistive device: Rolling walker (2 wheeled) Gait Pattern/deviations: Step-to pattern;Decreased stance time - right;Decreased step length - left Gait velocity: slow   General Gait  Details: Pt able to ambulate using RW and mod assist. Pt required cueing regarding foot and walker placement. Pt demonstrated step-to gait pattern with decreased stance time on R and decreased step length on L. Pt demonstrated increased WB through UE on RW. Pt had to take 2 rest breaks due to dizziness and pain. Ambulation limited by pts dizziness and fatigue.    Stairs            Wheelchair Mobility    Modified Rankin (Stroke Patients Only)       Balance                                    Cognition Arousal/Alertness: Awake/alert Behavior During Therapy: WFL for tasks assessed/performed Overall Cognitive Status: Within Functional Limits for tasks assessed                      Exercises Total Joint Exercises Goniometric ROM: R LE AAROM 4-78 degrees Other Exercises Other Exercises: Pt able to perform seated ther-ex including ankle pumps and quad sets w/ no assist, SAQ and SLR with mod assist. Pt provided cues for breathing during exercises. All ther-ex performed x12 reps, except SAQ performed x10 reps due to pain.  Other Exercises: Pt performed toileting w/RW and mod assist for sitting down/standing up from commode and standing while performing hygeine.     General Comments        Pertinent Vitals/Pain Pain  Assessment: 0-10 Pain Score: 5  Pain Location: R knee Pain Descriptors / Indicators: Operative site guarding;Aching;Sharp Pain Intervention(s): Limited activity within patient's tolerance    Home Living                      Prior Function            PT Goals (current goals can now be found in the care plan section) Acute Rehab PT Goals Patient Stated Goal: Return to being independent w/ ADLs, return to work PT Goal Formulation: With patient Time For Goal Achievement: 05/12/15 Potential to Achieve Goals: Good Progress towards PT goals: Progressing toward goals    Frequency  BID    PT Plan Current plan remains appropriate     Co-evaluation             End of Session Equipment Utilized During Treatment: Gait belt Activity Tolerance: Patient limited by pain Patient left: in chair;with call bell/phone within reach     Time: 1052-1118 PT Time Calculation (min) (ACUTE ONLY): 26 min  Charges:                       G Codes:      Brianna Dalton 2015-05-07, 11:38 AM M. Barnett Abu, SPT

## 2015-04-29 NOTE — Progress Notes (Signed)
LCSW met with patient and she will be transported to [facility] by EMS. Spoke to Keisha RN  Patient going to [address]  Call report #   [id number]  Claudine Bandi LCSW [phone] 

## 2015-04-29 NOTE — Progress Notes (Signed)
   Subjective: 2 Days Post-Op Procedure(s) (LRB): COMPUTER ASSISTED TOTAL KNEE ARTHROPLASTY (Right) Patient reports pain as moderate.   Patient is well, and has had no acute complaints or problems Continue with physical therapy today.  Plan is to go Rehab after hospital stay. no nausea and no vomiting Patient denies any chest pains or shortness of breath. Objective: Vital signs in last 24 hours: Temp:  [97.4 F (36.3 C)-98.6 F (37 C)] 98.6 F (37 C) (04/07 0353) Pulse Rate:  [58-117] 105 (04/07 0353) Resp:  [17-18] 18 (04/06 2015) BP: (98-179)/(49-97) 133/69 mmHg (04/07 0353) SpO2:  [93 %-100 %] 93 % (04/07 0353) well approximated incision Heels are non tender and elevated off the bed using rolled towels Intake/Output from previous day: 04/06 0701 - 04/07 0700 In: 240 [P.O.:240] Out: 400 [Drains:400] Intake/Output this shift: Total I/O In: -  Out: 70 [Drains:70]   Recent Labs  04/28/15 0614 04/29/15 0506  HGB 13.4 11.6*    Recent Labs  04/28/15 0614 04/29/15 0506  WBC 8.6 6.3  RBC 4.50 4.05  HCT 39.8 34.5*  PLT 153 124*    Recent Labs  04/28/15 0614 04/29/15 0506  NA 136 131*  K 4.3 4.1  CL 103 103  CO2 28 25  BUN 12 15  CREATININE 0.79 0.77  GLUCOSE 124* 131*  CALCIUM 8.7* 8.2*   No results for input(s): LABPT, INR in the last 72 hours.  EXAM General - Patient is Alert, Appropriate and Oriented Extremity - Neurologically intact Neurovascular intact Sensation intact distally Intact pulses distally Dorsiflexion/Plantar flexion intact Dressing - scant drainage Motor Function - intact, moving foot and toes well on exam.    Past Medical History  Diagnosis Date  . Hypothyroidism   . Hypertension   . Angina pectoris syndrome ([place])   . Arthritis   . Varicose vein of leg   . Arterial hemorrhage     Lt ankle possible venous bleed  . Neuropathy ([facility])     Assessment/Plan: 2 Days Post-Op Procedure(s) (LRB): COMPUTER ASSISTED TOTAL KNEE  ARTHROPLASTY (Right) Active Problems:   S/P total knee arthroplasty  Estimated body mass index is 41.33 kg/(m^2) as calculated from the following:   Height as of this encounter: 5\' 9"  (1.753 m).   Weight as of this encounter: 127.007 kg (280 lb). Up with therapy Discharge to SNF  Labs: Were reviewed DVT Prophylaxis - Lovenox, Foot Pumps and TED hose Weight-Bearing as tolerated to left leg Hemovac was discontinued on today's visit Please change dressing prior to being discharged. We'll need to apply the TED stockings to the operative leg as well.  Jillyn Ledger. [place] 04/29/2015, 7:14 AM

## 2015-04-29 NOTE — Progress Notes (Signed)
Sodium 131. Notified Vance Peper.

## 2015-05-02 DIAGNOSIS — E039 Hypothyroidism, unspecified: Secondary | ICD-10-CM | POA: Diagnosis not present

## 2015-05-02 DIAGNOSIS — E871 Hypo-osmolality and hyponatremia: Secondary | ICD-10-CM | POA: Diagnosis not present

## 2015-05-02 DIAGNOSIS — R21 Rash and other nonspecific skin eruption: Secondary | ICD-10-CM | POA: Diagnosis not present

## 2015-05-02 DIAGNOSIS — M159 Polyosteoarthritis, unspecified: Secondary | ICD-10-CM | POA: Diagnosis not present

## 2015-05-02 DIAGNOSIS — I10 Essential (primary) hypertension: Secondary | ICD-10-CM | POA: Diagnosis not present

## 2015-05-02 LAB — COMPREHENSIVE METABOLIC PANEL
ALT: 28 U/L (ref 14–54)
AST: 31 U/L (ref 15–41)
Albumin: 3.3 g/dL — ABNORMAL LOW (ref 3.5–5.0)
Alkaline Phosphatase: 63 U/L (ref 38–126)
Anion gap: 6 (ref 5–15)
BUN: 12 mg/dL (ref 6–20)
CO2: 30 mmol/L (ref 22–32)
Calcium: 8.8 mg/dL — ABNORMAL LOW (ref 8.9–10.3)
Chloride: 100 mmol/L — ABNORMAL LOW (ref 101–111)
Creatinine, Ser: 0.72 mg/dL (ref 0.44–1.00)
GFR calc Af Amer: >60 mL/min (ref >60)
GFR calc non Af Amer: >60 mL/min (ref >60)
Glucose, Bld: 98 mg/dL (ref 65–99)
Potassium: 4.2 mmol/L (ref 3.5–5.1)
Sodium: 136 mmol/L (ref 135–145)
Total Bilirubin: 0.6 mg/dL (ref 0.3–1.2)
Total Protein: 6.4 g/dL — ABNORMAL LOW (ref 6.5–8.1)

## 2015-05-02 LAB — CBC WITH DIFFERENTIAL/PLATELET
Basophils Absolute: 0 10*3/uL (ref 0–0.1)
Basophils Relative: 1 %
Eosinophils Absolute: 0.3 10*3/uL (ref 0–0.7)
Eosinophils Relative: 5 %
HCT: 37.6 % (ref 35.0–47.0)
Hemoglobin: 12.5 g/dL (ref 12.0–16.0)
Lymphocytes Relative: 25 %
Lymphs Abs: 1.6 10*3/uL (ref 1.0–3.6)
MCH: 28.5 pg (ref 26.0–34.0)
MCHC: 33.4 g/dL (ref 32.0–36.0)
MCV: 85.4 fL (ref 80.0–100.0)
Monocytes Absolute: 0.7 10*3/uL (ref 0.2–0.9)
Monocytes Relative: 11 %
Neutro Abs: 3.7 10*3/uL (ref 1.4–6.5)
Neutrophils Relative %: 58 %
Platelets: 174 10*3/uL (ref 150–440)
RBC: 4.4 MIL/uL (ref 3.80–5.20)
RDW: 13.6 % (ref 11.5–14.5)
WBC: 6.3 10*3/uL (ref 3.6–11.0)

## 2015-05-13 DIAGNOSIS — Z96651 Presence of right artificial knee joint: Secondary | ICD-10-CM | POA: Diagnosis not present

## 2015-05-16 DIAGNOSIS — Z96651 Presence of right artificial knee joint: Secondary | ICD-10-CM | POA: Diagnosis not present

## 2015-05-20 DIAGNOSIS — Z96651 Presence of right artificial knee joint: Secondary | ICD-10-CM | POA: Diagnosis not present

## 2015-05-23 DIAGNOSIS — Z96651 Presence of right artificial knee joint: Secondary | ICD-10-CM | POA: Diagnosis not present

## 2015-05-25 DIAGNOSIS — N289 Disorder of kidney and ureter, unspecified: Secondary | ICD-10-CM | POA: Diagnosis not present

## 2015-05-25 DIAGNOSIS — Z79899 Other long term (current) drug therapy: Secondary | ICD-10-CM | POA: Diagnosis not present

## 2015-05-25 DIAGNOSIS — I10 Essential (primary) hypertension: Secondary | ICD-10-CM | POA: Diagnosis not present

## 2015-05-25 DIAGNOSIS — M545 Low back pain: Secondary | ICD-10-CM | POA: Diagnosis not present

## 2015-05-25 DIAGNOSIS — G8929 Other chronic pain: Secondary | ICD-10-CM | POA: Diagnosis not present

## 2015-05-25 DIAGNOSIS — E7800 Pure hypercholesterolemia, unspecified: Secondary | ICD-10-CM | POA: Diagnosis not present

## 2015-05-25 DIAGNOSIS — M1711 Unilateral primary osteoarthritis, right knee: Secondary | ICD-10-CM | POA: Diagnosis not present

## 2015-05-25 DIAGNOSIS — E039 Hypothyroidism, unspecified: Secondary | ICD-10-CM | POA: Diagnosis not present

## 2015-05-25 DIAGNOSIS — N390 Urinary tract infection, site not specified: Secondary | ICD-10-CM | POA: Diagnosis not present

## 2015-05-26 DIAGNOSIS — Z96651 Presence of right artificial knee joint: Secondary | ICD-10-CM | POA: Diagnosis not present

## 2015-05-30 DIAGNOSIS — Z96651 Presence of right artificial knee joint: Secondary | ICD-10-CM | POA: Diagnosis not present

## 2015-06-02 DIAGNOSIS — Z96651 Presence of right artificial knee joint: Secondary | ICD-10-CM | POA: Diagnosis not present

## 2015-06-06 DIAGNOSIS — Z96651 Presence of right artificial knee joint: Secondary | ICD-10-CM | POA: Diagnosis not present

## 2015-06-07 DIAGNOSIS — Z96651 Presence of right artificial knee joint: Secondary | ICD-10-CM | POA: Diagnosis not present

## 2015-06-12 DIAGNOSIS — Z96659 Presence of unspecified artificial knee joint: Secondary | ICD-10-CM | POA: Insufficient documentation

## 2015-06-13 DIAGNOSIS — Z96651 Presence of right artificial knee joint: Secondary | ICD-10-CM | POA: Diagnosis not present

## 2015-06-16 DIAGNOSIS — Z96651 Presence of right artificial knee joint: Secondary | ICD-10-CM | POA: Diagnosis not present

## 2015-08-09 ENCOUNTER — Other Ambulatory Visit: Payer: Self-pay | Admitting: Internal Medicine

## 2015-08-09 DIAGNOSIS — M542 Cervicalgia: Secondary | ICD-10-CM

## 2015-08-09 DIAGNOSIS — H532 Diplopia: Secondary | ICD-10-CM

## 2015-08-09 DIAGNOSIS — Z79899 Other long term (current) drug therapy: Secondary | ICD-10-CM | POA: Diagnosis not present

## 2015-08-24 ENCOUNTER — Ambulatory Visit
Admission: RE | Admit: 2015-08-24 | Discharge: 2015-08-24 | Disposition: A | Discharge status: Home / Self Care | Payer: PPO | Source: Ambulatory Visit | Attending: Internal Medicine | Admitting: Internal Medicine

## 2015-08-24 DIAGNOSIS — I6523 Occlusion and stenosis of bilateral carotid arteries: Secondary | ICD-10-CM | POA: Insufficient documentation

## 2015-08-24 DIAGNOSIS — H532 Diplopia: Secondary | ICD-10-CM | POA: Insufficient documentation

## 2015-08-24 DIAGNOSIS — M542 Cervicalgia: Secondary | ICD-10-CM | POA: Insufficient documentation

## 2015-08-24 MED ORDER — IOPAMIDOL (ISOVUE-370) INJECTION 76%
75.0000 mL | Freq: Once | INTRAVENOUS | Status: AC | PRN
  Administered 2015-08-24: 75 mL via INTRAVENOUS

## 2015-09-15 DIAGNOSIS — E7800 Pure hypercholesterolemia, unspecified: Secondary | ICD-10-CM | POA: Diagnosis not present

## 2015-09-15 DIAGNOSIS — I10 Essential (primary) hypertension: Secondary | ICD-10-CM | POA: Diagnosis not present

## 2015-09-15 DIAGNOSIS — Z1239 Encounter for other screening for malignant neoplasm of breast: Secondary | ICD-10-CM | POA: Diagnosis not present

## 2015-09-15 DIAGNOSIS — R7309 Other abnormal glucose: Secondary | ICD-10-CM | POA: Diagnosis not present

## 2015-09-15 DIAGNOSIS — G8929 Other chronic pain: Secondary | ICD-10-CM | POA: Diagnosis not present

## 2015-09-15 DIAGNOSIS — N390 Urinary tract infection, site not specified: Secondary | ICD-10-CM | POA: Diagnosis not present

## 2015-09-15 DIAGNOSIS — E079 Disorder of thyroid, unspecified: Secondary | ICD-10-CM | POA: Insufficient documentation

## 2015-09-15 DIAGNOSIS — M545 Low back pain: Secondary | ICD-10-CM | POA: Diagnosis not present

## 2015-09-15 DIAGNOSIS — N289 Disorder of kidney and ureter, unspecified: Secondary | ICD-10-CM | POA: Diagnosis not present

## 2015-09-15 DIAGNOSIS — Z79899 Other long term (current) drug therapy: Secondary | ICD-10-CM | POA: Diagnosis not present

## 2015-10-20 DIAGNOSIS — Z96651 Presence of right artificial knee joint: Secondary | ICD-10-CM | POA: Diagnosis not present

## 2016-03-27 ENCOUNTER — Other Ambulatory Visit: Payer: Self-pay | Admitting: Internal Medicine

## 2016-03-27 DIAGNOSIS — Z1231 Encounter for screening mammogram for malignant neoplasm of breast: Secondary | ICD-10-CM

## 2016-04-05 DIAGNOSIS — Z96651 Presence of right artificial knee joint: Secondary | ICD-10-CM | POA: Diagnosis not present

## 2016-04-05 DIAGNOSIS — Z96652 Presence of left artificial knee joint: Secondary | ICD-10-CM | POA: Diagnosis not present

## 2016-05-02 ENCOUNTER — Ambulatory Visit
Admission: RE | Admit: 2016-05-02 | Discharge: 2016-05-02 | Disposition: A | Discharge status: Home / Self Care | Payer: PPO | Source: Ambulatory Visit | Attending: Internal Medicine | Admitting: Internal Medicine

## 2016-05-02 DIAGNOSIS — Z1231 Encounter for screening mammogram for malignant neoplasm of breast: Secondary | ICD-10-CM

## 2016-05-08 ENCOUNTER — Other Ambulatory Visit: Payer: Self-pay | Admitting: Physician Assistant

## 2016-05-08 DIAGNOSIS — N644 Mastodynia: Secondary | ICD-10-CM | POA: Diagnosis not present

## 2016-05-08 DIAGNOSIS — R921 Mammographic calcification found on diagnostic imaging of breast: Secondary | ICD-10-CM

## 2016-05-10 ENCOUNTER — Ambulatory Visit
Admission: RE | Admit: 2016-05-10 | Discharge: 2016-05-10 | Disposition: A | Discharge status: Home / Self Care | Payer: PPO | Source: Ambulatory Visit | Attending: Physician Assistant | Admitting: Physician Assistant

## 2016-05-10 DIAGNOSIS — R921 Mammographic calcification found on diagnostic imaging of breast: Secondary | ICD-10-CM | POA: Diagnosis not present

## 2016-05-10 DIAGNOSIS — N644 Mastodynia: Secondary | ICD-10-CM | POA: Diagnosis not present

## 2016-05-10 DIAGNOSIS — N6001 Solitary cyst of right breast: Secondary | ICD-10-CM | POA: Diagnosis not present

## 2016-05-17 ENCOUNTER — Ambulatory Visit: Payer: PPO

## 2016-05-17 ENCOUNTER — Other Ambulatory Visit: Payer: PPO

## 2016-08-02 DIAGNOSIS — M545 Low back pain: Secondary | ICD-10-CM | POA: Diagnosis not present

## 2016-08-02 DIAGNOSIS — N289 Disorder of kidney and ureter, unspecified: Secondary | ICD-10-CM | POA: Diagnosis not present

## 2016-08-02 DIAGNOSIS — Z1211 Encounter for screening for malignant neoplasm of colon: Secondary | ICD-10-CM | POA: Diagnosis not present

## 2016-08-02 DIAGNOSIS — Z0000 Encounter for general adult medical examination without abnormal findings: Secondary | ICD-10-CM | POA: Diagnosis not present

## 2016-08-02 DIAGNOSIS — R7309 Other abnormal glucose: Secondary | ICD-10-CM | POA: Diagnosis not present

## 2016-08-02 DIAGNOSIS — R61 Generalized hyperhidrosis: Secondary | ICD-10-CM | POA: Diagnosis not present

## 2016-08-02 DIAGNOSIS — I10 Essential (primary) hypertension: Secondary | ICD-10-CM | POA: Diagnosis not present

## 2016-08-02 DIAGNOSIS — Z79899 Other long term (current) drug therapy: Secondary | ICD-10-CM | POA: Diagnosis not present

## 2016-08-02 DIAGNOSIS — R55 Syncope and collapse: Secondary | ICD-10-CM | POA: Diagnosis not present

## 2016-08-02 DIAGNOSIS — E7800 Pure hypercholesterolemia, unspecified: Secondary | ICD-10-CM | POA: Diagnosis not present

## 2016-08-02 DIAGNOSIS — E039 Hypothyroidism, unspecified: Secondary | ICD-10-CM | POA: Diagnosis not present

## 2016-08-27 DIAGNOSIS — L821 Other seborrheic keratosis: Secondary | ICD-10-CM | POA: Diagnosis not present

## 2016-08-27 DIAGNOSIS — L719 Rosacea, unspecified: Secondary | ICD-10-CM | POA: Diagnosis not present

## 2016-08-27 DIAGNOSIS — L720 Epidermal cyst: Secondary | ICD-10-CM | POA: Diagnosis not present

## 2016-08-27 DIAGNOSIS — L812 Freckles: Secondary | ICD-10-CM | POA: Diagnosis not present

## 2016-08-27 DIAGNOSIS — Z85828 Personal history of other malignant neoplasm of skin: Secondary | ICD-10-CM | POA: Diagnosis not present

## 2016-08-27 DIAGNOSIS — D1801 Hemangioma of skin and subcutaneous tissue: Secondary | ICD-10-CM | POA: Diagnosis not present

## 2016-10-24 DIAGNOSIS — I6523 Occlusion and stenosis of bilateral carotid arteries: Secondary | ICD-10-CM | POA: Diagnosis not present

## 2016-10-24 DIAGNOSIS — R55 Syncope and collapse: Secondary | ICD-10-CM | POA: Diagnosis not present

## 2016-12-04 DIAGNOSIS — H1133 Conjunctival hemorrhage, bilateral: Secondary | ICD-10-CM | POA: Diagnosis not present

## 2016-12-04 DIAGNOSIS — R0602 Shortness of breath: Secondary | ICD-10-CM | POA: Insufficient documentation

## 2016-12-04 DIAGNOSIS — I10 Essential (primary) hypertension: Secondary | ICD-10-CM | POA: Diagnosis not present

## 2016-12-04 DIAGNOSIS — I6523 Occlusion and stenosis of bilateral carotid arteries: Secondary | ICD-10-CM | POA: Insufficient documentation

## 2016-12-04 DIAGNOSIS — E7800 Pure hypercholesterolemia, unspecified: Secondary | ICD-10-CM | POA: Diagnosis not present

## 2016-12-04 DIAGNOSIS — R079 Chest pain, unspecified: Secondary | ICD-10-CM | POA: Diagnosis not present

## 2016-12-10 DIAGNOSIS — H1133 Conjunctival hemorrhage, bilateral: Secondary | ICD-10-CM | POA: Diagnosis not present

## 2017-02-13 DIAGNOSIS — R7309 Other abnormal glucose: Secondary | ICD-10-CM | POA: Diagnosis not present

## 2017-02-13 DIAGNOSIS — Z6841 Body Mass Index (BMI) 40.0 and over, adult: Secondary | ICD-10-CM | POA: Diagnosis not present

## 2017-02-13 DIAGNOSIS — Z79899 Other long term (current) drug therapy: Secondary | ICD-10-CM | POA: Diagnosis not present

## 2017-02-13 DIAGNOSIS — I10 Essential (primary) hypertension: Secondary | ICD-10-CM | POA: Diagnosis not present

## 2017-02-13 DIAGNOSIS — E079 Disorder of thyroid, unspecified: Secondary | ICD-10-CM | POA: Diagnosis not present

## 2017-02-13 DIAGNOSIS — E7800 Pure hypercholesterolemia, unspecified: Secondary | ICD-10-CM | POA: Diagnosis not present

## 2017-02-13 DIAGNOSIS — M79671 Pain in right foot: Secondary | ICD-10-CM | POA: Diagnosis not present

## 2017-02-13 DIAGNOSIS — N289 Disorder of kidney and ureter, unspecified: Secondary | ICD-10-CM | POA: Diagnosis not present

## 2017-02-26 DIAGNOSIS — M109 Gout, unspecified: Secondary | ICD-10-CM | POA: Diagnosis not present

## 2017-02-26 DIAGNOSIS — I10 Essential (primary) hypertension: Secondary | ICD-10-CM | POA: Diagnosis not present

## 2017-03-11 DIAGNOSIS — Z79899 Other long term (current) drug therapy: Secondary | ICD-10-CM | POA: Insufficient documentation

## 2017-03-11 DIAGNOSIS — M19071 Primary osteoarthritis, right ankle and foot: Secondary | ICD-10-CM | POA: Diagnosis not present

## 2017-03-11 DIAGNOSIS — M10071 Idiopathic gout, right ankle and foot: Secondary | ICD-10-CM | POA: Diagnosis not present

## 2017-03-11 DIAGNOSIS — M25474 Effusion, right foot: Secondary | ICD-10-CM | POA: Insufficient documentation

## 2017-03-11 DIAGNOSIS — M170 Bilateral primary osteoarthritis of knee: Secondary | ICD-10-CM | POA: Diagnosis not present

## 2017-03-11 DIAGNOSIS — M109 Gout, unspecified: Secondary | ICD-10-CM | POA: Insufficient documentation

## 2017-03-21 ENCOUNTER — Other Ambulatory Visit: Payer: Self-pay | Admitting: Internal Medicine

## 2017-03-21 ENCOUNTER — Ambulatory Visit
Admission: RE | Admit: 2017-03-21 | Discharge: 2017-03-21 | Disposition: A | Discharge status: Home / Self Care | Payer: Medicare HMO | Source: Ambulatory Visit | Attending: Internal Medicine | Admitting: Internal Medicine

## 2017-03-21 DIAGNOSIS — M25474 Effusion, right foot: Secondary | ICD-10-CM | POA: Diagnosis not present

## 2017-03-21 DIAGNOSIS — M170 Bilateral primary osteoarthritis of knee: Secondary | ICD-10-CM | POA: Diagnosis not present

## 2017-03-21 DIAGNOSIS — M79661 Pain in right lower leg: Secondary | ICD-10-CM | POA: Insufficient documentation

## 2017-03-21 DIAGNOSIS — M7989 Other specified soft tissue disorders: Secondary | ICD-10-CM

## 2017-03-21 DIAGNOSIS — R600 Localized edema: Secondary | ICD-10-CM | POA: Diagnosis not present

## 2017-03-29 DIAGNOSIS — M79604 Pain in right leg: Secondary | ICD-10-CM | POA: Diagnosis not present

## 2017-03-29 DIAGNOSIS — R600 Localized edema: Secondary | ICD-10-CM | POA: Diagnosis not present

## 2017-04-03 DIAGNOSIS — R600 Localized edema: Secondary | ICD-10-CM | POA: Diagnosis not present

## 2017-04-03 DIAGNOSIS — I10 Essential (primary) hypertension: Secondary | ICD-10-CM | POA: Diagnosis not present

## 2017-04-09 ENCOUNTER — Observation Stay
Admission: EM | Admit: 2017-04-09 | Discharge: 2017-04-10 | Disposition: A | Discharge status: Home / Self Care | Payer: Medicare HMO | Attending: Internal Medicine | Admitting: Internal Medicine

## 2017-04-09 ENCOUNTER — Emergency Department: Payer: Medicare HMO

## 2017-04-09 ENCOUNTER — Other Ambulatory Visit: Payer: Self-pay

## 2017-04-09 ENCOUNTER — Encounter: Payer: Self-pay | Admitting: Emergency Medicine

## 2017-04-09 DIAGNOSIS — Z880 Allergy status to penicillin: Secondary | ICD-10-CM | POA: Insufficient documentation

## 2017-04-09 DIAGNOSIS — J40 Bronchitis, not specified as acute or chronic: Secondary | ICD-10-CM | POA: Diagnosis present

## 2017-04-09 DIAGNOSIS — I700 Atherosclerosis of aorta: Secondary | ICD-10-CM | POA: Diagnosis not present

## 2017-04-09 DIAGNOSIS — G629 Polyneuropathy, unspecified: Secondary | ICD-10-CM | POA: Diagnosis not present

## 2017-04-09 DIAGNOSIS — R05 Cough: Secondary | ICD-10-CM | POA: Diagnosis not present

## 2017-04-09 DIAGNOSIS — R072 Precordial pain: Secondary | ICD-10-CM | POA: Diagnosis not present

## 2017-04-09 DIAGNOSIS — J209 Acute bronchitis, unspecified: Secondary | ICD-10-CM | POA: Diagnosis not present

## 2017-04-09 DIAGNOSIS — E039 Hypothyroidism, unspecified: Secondary | ICD-10-CM | POA: Insufficient documentation

## 2017-04-09 DIAGNOSIS — I8390 Asymptomatic varicose veins of unspecified lower extremity: Secondary | ICD-10-CM | POA: Diagnosis not present

## 2017-04-09 DIAGNOSIS — R748 Abnormal levels of other serum enzymes: Secondary | ICD-10-CM | POA: Diagnosis not present

## 2017-04-09 DIAGNOSIS — R079 Chest pain, unspecified: Secondary | ICD-10-CM | POA: Diagnosis present

## 2017-04-09 DIAGNOSIS — Z885 Allergy status to narcotic agent status: Secondary | ICD-10-CM | POA: Insufficient documentation

## 2017-04-09 DIAGNOSIS — R0602 Shortness of breath: Secondary | ICD-10-CM | POA: Diagnosis not present

## 2017-04-09 DIAGNOSIS — E785 Hyperlipidemia, unspecified: Secondary | ICD-10-CM | POA: Diagnosis not present

## 2017-04-09 DIAGNOSIS — Z91040 Latex allergy status: Secondary | ICD-10-CM | POA: Insufficient documentation

## 2017-04-09 DIAGNOSIS — M1990 Unspecified osteoarthritis, unspecified site: Secondary | ICD-10-CM | POA: Diagnosis not present

## 2017-04-09 DIAGNOSIS — Z6841 Body Mass Index (BMI) 40.0 and over, adult: Secondary | ICD-10-CM | POA: Insufficient documentation

## 2017-04-09 DIAGNOSIS — I214 Non-ST elevation (NSTEMI) myocardial infarction: Secondary | ICD-10-CM

## 2017-04-09 DIAGNOSIS — I10 Essential (primary) hypertension: Secondary | ICD-10-CM | POA: Insufficient documentation

## 2017-04-09 LAB — TROPONIN I
Troponin I: 0.03 ng/mL (ref <0.03)
Troponin I: 0.05 ng/mL (ref <0.03)
Troponin I: 0.05 ng/mL (ref <0.03)

## 2017-04-09 LAB — PROTIME-INR
INR: 1.1
Prothrombin Time: 14.1 seconds (ref 11.4–15.2)

## 2017-04-09 LAB — CBC
HCT: 43.2 % (ref 35.0–47.0)
Hemoglobin: 14.3 g/dL (ref 12.0–16.0)
MCH: 29.4 pg (ref 26.0–34.0)
MCHC: 33.1 g/dL (ref 32.0–36.0)
MCV: 89 fL (ref 80.0–100.0)
Platelets: 192 10*3/uL (ref 150–440)
RBC: 4.85 MIL/uL (ref 3.80–5.20)
RDW: 14.6 % — ABNORMAL HIGH (ref 11.5–14.5)
WBC: 10.6 10*3/uL (ref 3.6–11.0)

## 2017-04-09 LAB — BASIC METABOLIC PANEL
Anion gap: 11 (ref 5–15)
BUN: 10 mg/dL (ref 6–20)
CO2: 23 mmol/L (ref 22–32)
Calcium: 8.9 mg/dL (ref 8.9–10.3)
Chloride: 104 mmol/L (ref 101–111)
Creatinine, Ser: 0.75 mg/dL (ref 0.44–1.00)
GFR calc Af Amer: >60 mL/min (ref >60)
GFR calc non Af Amer: >60 mL/min (ref >60)
Glucose, Bld: 141 mg/dL — ABNORMAL HIGH (ref 65–99)
Potassium: 3.6 mmol/L (ref 3.5–5.1)
Sodium: 138 mmol/L (ref 135–145)

## 2017-04-09 LAB — APTT: aPTT: 72 seconds — ABNORMAL HIGH (ref 24–36)

## 2017-04-09 MED ORDER — IPRATROPIUM-ALBUTEROL 0.5-2.5 (3) MG/3ML IN SOLN
3.0000 mL | Freq: Once | RESPIRATORY_TRACT | Status: AC
  Administered 2017-04-09: 3 mL via RESPIRATORY_TRACT
  Filled 2017-04-09: qty 3

## 2017-04-09 MED ORDER — DOXYCYCLINE HYCLATE 100 MG PO TABS
100.0000 mg | ORAL_TABLET | Freq: Once | ORAL | Status: AC
  Administered 2017-04-09: 100 mg via ORAL
  Filled 2017-04-09: qty 1

## 2017-04-09 MED ORDER — ASPIRIN 81 MG PO CHEW
324.0000 mg | CHEWABLE_TABLET | Freq: Once | ORAL | Status: AC
  Administered 2017-04-09: 324 mg via ORAL
  Filled 2017-04-09: qty 4

## 2017-04-09 MED ORDER — ONDANSETRON HCL 4 MG/2ML IJ SOLN
INTRAMUSCULAR | Status: AC
  Administered 2017-04-09: 4 mg via INTRAVENOUS
  Filled 2017-04-09: qty 2

## 2017-04-09 MED ORDER — HEPARIN BOLUS VIA INFUSION
4000.0000 [IU] | Freq: Once | INTRAVENOUS | Status: AC
Start: 2017-04-09 — End: 2017-04-09
  Administered 2017-04-09: 4000 [IU] via INTRAVENOUS
  Filled 2017-04-09: qty 4000

## 2017-04-09 MED ORDER — LEVOTHYROXINE SODIUM 50 MCG PO TABS
150.0000 ug | ORAL_TABLET | Freq: Every day | ORAL | Status: DC
  Administered 2017-04-10: 150 ug via ORAL
  Filled 2017-04-09: qty 1

## 2017-04-09 MED ORDER — ACETAMINOPHEN 650 MG RE SUPP: 650.0000 mg | Freq: Four times a day (QID) | RECTAL | Status: DC | PRN

## 2017-04-09 MED ORDER — IOPAMIDOL (ISOVUE-370) INJECTION 76%
75.0000 mL | Freq: Once | INTRAVENOUS | Status: AC | PRN
  Administered 2017-04-09: 75 mL via INTRAVENOUS

## 2017-04-09 MED ORDER — SODIUM CHLORIDE 0.9 % IV BOLUS (SEPSIS): 1000.0000 mL | Freq: Once | INTRAVENOUS | Status: DC

## 2017-04-09 MED ORDER — ACETAMINOPHEN 325 MG PO TABS
650.0000 mg | ORAL_TABLET | Freq: Four times a day (QID) | ORAL | Status: DC | PRN
  Administered 2017-04-09 – 2017-04-10 (×2): 650 mg via ORAL
  Filled 2017-04-09 (×2): qty 2

## 2017-04-09 MED ORDER — PREDNISONE 20 MG PO TABS
60.0000 mg | ORAL_TABLET | Freq: Once | ORAL | Status: AC
  Administered 2017-04-09: 60 mg via ORAL
  Filled 2017-04-09: qty 3

## 2017-04-09 MED ORDER — ENALAPRIL MALEATE 20 MG PO TABS
20.0000 mg | ORAL_TABLET | Freq: Every day | ORAL | Status: DC
  Administered 2017-04-09 – 2017-04-10 (×2): 20 mg via ORAL
  Filled 2017-04-09 (×2): qty 1

## 2017-04-09 MED ORDER — IPRATROPIUM-ALBUTEROL 0.5-2.5 (3) MG/3ML IN SOLN
RESPIRATORY_TRACT | Status: AC
  Administered 2017-04-09: 3 mL via RESPIRATORY_TRACT
  Filled 2017-04-09: qty 3

## 2017-04-09 MED ORDER — ONDANSETRON HCL 4 MG/2ML IJ SOLN: 4.0000 mg | Freq: Four times a day (QID) | INTRAMUSCULAR | Status: DC | PRN

## 2017-04-09 MED ORDER — SODIUM CHLORIDE 0.9 % IV BOLUS (SEPSIS)
1000.0000 mL | Freq: Once | INTRAVENOUS | Status: AC
  Administered 2017-04-09: 1000 mL via INTRAVENOUS

## 2017-04-09 MED ORDER — TRAMADOL HCL 50 MG PO TABS
50.0000 mg | ORAL_TABLET | ORAL | Status: DC | PRN
  Administered 2017-04-10: 100 mg via ORAL
  Filled 2017-04-09: qty 2

## 2017-04-09 MED ORDER — ONDANSETRON HCL 4 MG PO TABS: 4.0000 mg | ORAL_TABLET | Freq: Four times a day (QID) | ORAL | Status: DC | PRN

## 2017-04-09 MED ORDER — HEPARIN (PORCINE) IN NACL 100-0.45 UNIT/ML-% IJ SOLN
1400.0000 [IU]/h | INTRAMUSCULAR | Status: DC
Start: 2017-04-09 — End: 2017-04-10
  Administered 2017-04-09: 1200 [IU]/h via INTRAVENOUS
  Administered 2017-04-10: 1400 [IU]/h via INTRAVENOUS
  Filled 2017-04-09 (×3): qty 250

## 2017-04-09 MED ORDER — ALLOPURINOL 300 MG PO TABS
300.0000 mg | ORAL_TABLET | Freq: Every day | ORAL | Status: DC
  Administered 2017-04-09 – 2017-04-10 (×2): 300 mg via ORAL
  Filled 2017-04-09 (×2): qty 1

## 2017-04-09 MED ORDER — FENTANYL CITRATE (PF) 100 MCG/2ML IJ SOLN
50.0000 ug | Freq: Once | INTRAMUSCULAR | Status: AC
  Administered 2017-04-09: 50 ug via INTRAVENOUS

## 2017-04-09 MED ORDER — METOPROLOL SUCCINATE ER 25 MG PO TB24
25.0000 mg | ORAL_TABLET | Freq: Every day | ORAL | Status: DC
  Administered 2017-04-09 – 2017-04-10 (×2): 25 mg via ORAL
  Filled 2017-04-09 (×2): qty 1

## 2017-04-09 MED ORDER — FENTANYL CITRATE (PF) 100 MCG/2ML IJ SOLN
INTRAMUSCULAR | Status: AC
  Administered 2017-04-09: 50 ug via INTRAVENOUS
  Filled 2017-04-09: qty 2

## 2017-04-09 MED ORDER — ONDANSETRON HCL 4 MG/2ML IJ SOLN
4.0000 mg | Freq: Once | INTRAMUSCULAR | Status: AC
  Administered 2017-04-09: 4 mg via INTRAVENOUS

## 2017-04-09 MED ORDER — NITROGLYCERIN 0.4 MG SL SUBL
0.4000 mg | SUBLINGUAL_TABLET | SUBLINGUAL | Status: DC | PRN
  Administered 2017-04-09 (×2): 0.4 mg via SUBLINGUAL
  Filled 2017-04-09 (×2): qty 1

## 2017-04-09 MED ORDER — IPRATROPIUM-ALBUTEROL 0.5-2.5 (3) MG/3ML IN SOLN
3.0000 mL | Freq: Once | RESPIRATORY_TRACT | Status: AC
  Administered 2017-04-09: 3 mL via RESPIRATORY_TRACT

## 2017-04-09 NOTE — ED Notes (Signed)
Assisted the patient to the restroom. Patient urinated and is now back in the bed and hooked up to the monitor.

## 2017-04-09 NOTE — ED Notes (Signed)
RN unable to get report at this time

## 2017-04-09 NOTE — H&P (Addendum)
[facility] NAME: Brianna Dalton    MR#:  [id number]  DATE OF BIRTH:  [birth year]  DATE OF ADMISSION:  04/09/2017  PRIMARY CARE PHYSICIAN: Idelle Crouch, MD   REQUESTING/REFERRING PHYSICIAN: Dr. Burlene Arnt  CHIEF COMPLAINT:   Cough dry with substernal chest pain since this morning and elevated blood pressure at home HISTORY OF PRESENT ILLNESS:  Brianna Dalton  is a 72 y.o. female with a known history of hypertension, hypothyroidism comes to the emergency room accompanied by family members with complaints of dry hacking cough along with elevated blood pressure this morning and substernal chest pain.  Patient states pain is constant however after getting 2 nitroglycerin sublingual pain has eased a little bit.  Patient's first troponin was 0.03-second troponin is 0.05.  She is started on IV heparin drip.  CT of the chest is negative for PE.  She is being admitted for further evaluation and management for her chest pain and elevated troponin. PAST MEDICAL HISTORY:   Past Medical History:  Diagnosis Date  . Angina pectoris syndrome ([place])   . Arterial hemorrhage    Lt ankle possible venous bleed  . Arthritis   . Hypertension   . Hypothyroidism   . Neuropathy   . Varicose vein of leg     PAST SURGICAL HISTOIRY:   Past Surgical History:  Procedure Laterality Date  . ABDOMINAL HYSTERECTOMY    . BREAST BIOPSY Right 01/19/2013   stereo biopsy of two areas neg  . JOINT REPLACEMENT     lt knee  . KNEE ARTHROPLASTY Right 04/27/2015   Procedure: COMPUTER ASSISTED TOTAL KNEE ARTHROPLASTY;  Surgeon: Dereck Leep, MD;  Location: [facility] ORS;  Service: Orthopedics;  Laterality: Right;  . SHOULDER ARTHROSCOPY Left   . THYROID SURGERY Bilateral   . TUMOR REMOVAL Right    neck    SOCIAL HISTORY:   Social History   Tobacco Use  . Smoking status: Never Smoker  Substance Use Topics  . Alcohol use: No    FAMILY HISTORY:  No  family history on file.  DRUG ALLERGIES:   Allergies  Allergen Reactions  . Amlodipine   . Hydrocodone Hives  . Latex     NEGATIVE BY IgE (<0.10)  . Morphine And Related Other (See Comments)    Nausea delusion   . Penicillins Hives    Has patient had a PCN reaction causing immediate rash, facial/tongue/throat swelling, SOB or lightheadedness with hypotension: Yes Has patient had a PCN reaction causing severe rash involving mucus membranes or skin necrosis: No Has patient had a PCN reaction that required hospitalization: No Has patient had a PCN reaction occurring within the last 10 years: No If all of the above answers are "NO", then may proceed with Cephalosporin use.    REVIEW OF SYSTEMS:  Review of Systems  Constitutional: Negative for chills, fever and weight loss.  HENT: Negative for ear discharge, ear pain and nosebleeds.   Eyes: Negative for blurred vision, pain and discharge.  Respiratory: Positive for cough and shortness of breath. Negative for sputum production, wheezing and stridor.   Cardiovascular: Positive for chest pain. Negative for palpitations, orthopnea and PND.  Gastrointestinal: Negative for abdominal pain, diarrhea, nausea and vomiting.  Genitourinary: Negative for frequency and urgency.  Musculoskeletal: Negative for back pain and joint pain.  Neurological: Negative for sensory change, speech change, focal weakness and weakness.  Psychiatric/Behavioral: Negative for depression and hallucinations. The patient is not nervous/anxious.  MEDICATIONS AT HOME:   Prior to Admission medications   Medication Sig Start Date End Date Taking? Authorizing Provider  allopurinol (ZYLOPRIM) 300 MG tablet Take 300 mg by mouth daily. 03/11/17  Yes [provider]  enalapril (VASOTEC) 20 MG tablet Take 20 mg by mouth daily.   Yes [provider]  levothyroxine (SYNTHROID, LEVOTHROID) 150 MCG tablet Take 150 mcg by mouth daily before breakfast.   Yes  [provider]  metoprolol succinate (TOPROL-XL) 50 MG 24 hr tablet Take 25 mg by mouth daily.  02/26/17  Yes [provider]  torsemide (DEMADEX) 10 MG tablet Take 10 mg by mouth daily as needed.    [provider]  traMADol (ULTRAM) 50 MG tablet Take 1-2 tablets (50-100 mg total) by mouth every 4 (four) hours as needed for moderate pain. 04/28/15   Watt Climes, PA      VITAL SIGNS:  Blood pressure (!) 163/86, pulse (!) 117, temperature 98 F (36.7 C), temperature source Oral, resp. rate 20, height 5\' 10"  (1.778 m), weight 122.5 kg (270 lb), SpO2 97 %.  PHYSICAL EXAMINATION:  GENERAL:  72 y.o.-year-old patient lying in the bed with no acute distress.  EYES: Pupils equal, round, reactive to light and accommodation. No scleral icterus. Extraocular muscles intact.  HEENT: Head atraumatic, normocephalic. Oropharynx and nasopharynx clear.  NECK:  Supple, no jugular venous distention. No thyroid enlargement, no tenderness.  LUNGS: Normal breath sounds bilaterally, no wheezing, rales,rhonchi or crepitation. No use of accessory muscles of respiration.  CARDIOVASCULAR: S1, S2 normal. No murmurs, rubs, or gallops.  ABDOMEN: Soft, nontender, nondistended. Bowel sounds present. No organomegaly or mass.  EXTREMITIES: No pedal edema, cyanosis, or clubbing.  NEUROLOGIC: Cranial nerves II through XII are intact. Muscle strength 5/5 in all extremities. Sensation intact. Gait not checked.  PSYCHIATRIC: The patient is alert and oriented x 3.  SKIN: No obvious rash, lesion, or ulcer.   LABORATORY PANEL:   CBC Recent Labs  Lab 04/09/17 1056  WBC 10.6  HGB 14.3  HCT 43.2  PLT 192   ------------------------------------------------------------------------------------------------------------------  Chemistries  Recent Labs  Lab 04/09/17 1056  NA 138  K 3.6  CL 104  CO2 23  GLUCOSE 141*  BUN 10  CREATININE 0.75  CALCIUM 8.9    ------------------------------------------------------------------------------------------------------------------  Cardiac Enzymes Recent Labs  Lab 04/09/17 1400  TROPONINI 0.05*   ------------------------------------------------------------------------------------------------------------------  RADIOLOGY:  Ct Angio Chest Pe W And/or Wo Contrast  Result Date: 04/09/2017 CLINICAL DATA:  Short of breath and cough. Rule out pulmonary embolism. EXAM: CT ANGIOGRAPHY CHEST WITH CONTRAST TECHNIQUE: Multidetector CT imaging of the chest was performed using the standard protocol during bolus administration of intravenous contrast. Multiplanar CT image reconstructions and MIPs were obtained to evaluate the vascular anatomy. CONTRAST:  23mL ISOVUE-370 IOPAMIDOL (ISOVUE-370) INJECTION 76% COMPARISON:  04/09/2017 radiograph FINDINGS: Cardiovascular: Negative for pulmonary embolism. Pulmonary artery is mildly enlarged. Heart size normal. No pericardial effusion. Mild coronary calcification. Mild atherosclerotic calcification in the thoracic aorta without aneurysm or dissection. Mediastinum/Nodes: Negative for mass or adenopathy. Small hiatal hernia. Lungs/Pleura: Negative for infiltrate effusion or mass. Lungs are clear. Upper Abdomen: Negative Musculoskeletal: Lower thoracic vertebral spurring. No fracture or bone lesion. Review of the MIP images confirms the above findings. IMPRESSION: Negative for pulmonary embolism. Mild pulmonary artery enlargement suggesting pulmonary artery hypertension. No acute abnormality in the chest. Aortic Atherosclerosis (ICD10-I70.0). Electronically Signed   By: Franchot Gallo M.D.   On: 04/09/2017 12:52   Dg Chest Portable  1 View  Result Date: 04/09/2017 CLINICAL DATA:  Shortness of breath and productive cough. EXAM: PORTABLE CHEST 1 VIEW COMPARISON:  12/11/2011 FINDINGS: The heart size and mediastinal contours are within normal limits. Slight calcification in the arch of the  aorta. No effusions. Both lungs are clear. The visualized skeletal structures are unremarkable. IMPRESSION: No acute abnormality. Aortic Atherosclerosis (ICD10-I70.0). Electronically Signed   By: Lorriane Shire M.D.   On: 04/09/2017 11:06    EKG:   Normal sinus rhythm without ST elevation or depression IMPRESSION AND PLAN:   Brianna Dalton  is a 72 y.o. female with a known history of hypertension, hypothyroidism comes to the emergency room accompanied by family members with complaints of dry hacking cough along with elevated blood pressure this morning and substernal chest pain.  Patient states pain is constant however after getting 2 nitroglycerin sublingual pain has eased a little bit.  1.  Chest pain with elevated troponin with no acute ST elevation or depression on EKG -Admit to telemetry -IV heparin drip -Continue aspirin beta blockers ACE inhibitors  -Cardiology consultation with Dr. Ubaldo Glassing  2.  Dry cough without wheezing or history of COPD -Patient received doxycycline in the ER.  -Chest x-ray negative for bronchitis or pneumonia -Patient does not appear to be infectious at present.  I have ordered pro-calcitonin.  If it is elevated give a course of oral antibiotic.  For now I will hold off on antibiotics.  3.  Uncontrolled high blood pressure -Continue beta-blockers Vasotec.  PRN hydralazine if needed  4.  DVT prophylaxis already on IV heparin drip   All the records are reviewed and case discussed with ED provider. Management plans discussed with the patient, family and they are in agreement.  CODE STATUS: Full  TOTAL TIME TAKING CARE OF THIS PATIENT: 45* minutes.    Fritzi Mandes M.D on 04/09/2017 at 4:32 PM  Between 7am to 6pm - Pager - [phone]  After 6pm go to [email or web address] - password [email or web address] [facility]  [facility]  Office  [phone]  CC: Primary care physician; Idelle Crouch, MD

## 2017-04-09 NOTE — Consult Note (Signed)
Cardiology Consultation Note    Patient ID: Brianna Dalton, MRN: [id number], DOB/AGE: 08/05/1945 72 y.o. Admit date: 04/09/2017   Date of Consult: 04/09/2017 Primary Physician: Idelle Crouch, MD Primary Cardiologist: Dr. Nehemiah Massed  Chief Complaint: chest pain Reason for Consultation: chest  pain Requesting MD: Dr. Fritzi Mandes  HPI: Brianna Dalton is a 72 y.o. female with history of of chest pain, hypertension, surgical hypothyroidism, who was admitted after presenting to the er after awakening with severe chest pain and sob. Pain was midsternal with radiation to her arms. She felt sob and nauseated with the pain. SHe had no previous chest pain with exertion in the days prior to this event. In the er her ekg was unremarkable for ischemia. She had sinus tachycardia. She has a mild troponin elevation at 0.03, 0.05, 0.05. She was placed on iv heparin.She remains on her enalapril, asa and allopurinal that she is on as an outpatient. She had a cardiac cath 10 years ago which was normal.    Past Medical History:  Diagnosis Date  . Angina pectoris syndrome ([place])   . Arterial hemorrhage    Lt ankle possible venous bleed  . Arthritis   . Hypertension   . Hypothyroidism   . Neuropathy   . Varicose vein of leg       Surgical History:  Past Surgical History:  Procedure Laterality Date  . ABDOMINAL HYSTERECTOMY    . BREAST BIOPSY Right 01/19/2013   stereo biopsy of two areas neg  . JOINT REPLACEMENT     lt knee  . KNEE ARTHROPLASTY Right 04/27/2015   Procedure: COMPUTER ASSISTED TOTAL KNEE ARTHROPLASTY;  Surgeon: Dereck Leep, MD;  Location: [facility] ORS;  Service: Orthopedics;  Laterality: Right;  . SHOULDER ARTHROSCOPY Left   . THYROID SURGERY Bilateral   . TUMOR REMOVAL Right    neck     Home Meds: Prior to Admission medications   Medication Sig Start Date End Date Taking? Authorizing Provider  allopurinol (ZYLOPRIM) 300 MG tablet Take 300 mg by mouth daily. 03/11/17  Yes [provider]  enalapril (VASOTEC) 20 MG tablet Take 20 mg by mouth daily.   Yes [provider]  levothyroxine (SYNTHROID, LEVOTHROID) 150 MCG tablet Take 150 mcg by mouth daily before breakfast.   Yes [provider]  metoprolol succinate (TOPROL-XL) 50 MG 24 hr tablet Take 25 mg by mouth daily.  02/26/17  Yes [provider]  torsemide (DEMADEX) 10 MG tablet Take 10 mg by mouth daily as needed.    [provider]  traMADol (ULTRAM) 50 MG tablet Take 1-2 tablets (50-100 mg total) by mouth every 4 (four) hours as needed for moderate pain. 04/28/15   Watt Climes, PA    Inpatient Medications:  . allopurinol  300 mg Oral Daily  . enalapril  20 mg Oral Daily  . [START ON 04/10/2017] levothyroxine  150 mcg Oral QAC breakfast  . metoprolol succinate  25 mg Oral Daily   . heparin 1,200 Units/hr (04/09/17 1601)  . sodium chloride      Allergies:  Allergies  Allergen Reactions  . Amlodipine   . Hydrocodone Hives  . Latex     NEGATIVE BY IgE (<0.10)  . Morphine And Related Other (See Comments)    Nausea delusion   . Penicillins Hives    Has patient had a PCN reaction causing immediate rash, facial/tongue/throat swelling, SOB or lightheadedness with hypotension: Yes Has patient had a PCN reaction  causing severe rash involving mucus membranes or skin necrosis: No Has patient had a PCN reaction that required hospitalization: No Has patient had a PCN reaction occurring within the last 10 years: No If all of the above answers are "NO", then may proceed with Cephalosporin use.    Social History   Socioeconomic History  . Marital status: Divorced    Spouse name: Not on file  . Number of children: Not on file  . Years of education: Not on file  . Highest education level: Not on file  Social Needs  . Financial resource strain: Not on file  . Food insecurity - worry: Not on file  . Food insecurity - inability: Not on file  . Transportation needs -  medical: Not on file  . Transportation needs - non-medical: Not on file  Occupational History  . Not on file  Tobacco Use  . Smoking status: Never Smoker  Substance and Sexual Activity  . Alcohol use: No  . Drug use: No  . Sexual activity: No  Other Topics Concern  . Not on file  Social History Narrative  . Not on file     No family history on file.   Review of Systems: A 12-system review of systems was performed and is negative except as noted in the HPI.  Labs: Recent Labs    04/09/17 1056 04/09/17 1400 04/09/17 1738  TROPONINI 0.03* 0.05* 0.05*   Lab Results  Component Value Date   WBC 10.6 04/09/2017   HGB 14.3 04/09/2017   HCT 43.2 04/09/2017   MCV 89.0 04/09/2017   PLT 192 04/09/2017    Recent Labs  Lab 04/09/17 1056  NA 138  K 3.6  CL 104  CO2 23  BUN 10  CREATININE 0.75  CALCIUM 8.9  GLUCOSE 141*   No results found for: CHOL, HDL, LDLCALC, TRIG No results found for: DDIMER  Radiology/Studies:  Ct Angio Chest Pe W And/or Wo Contrast  Result Date: 04/09/2017 CLINICAL DATA:  Short of breath and cough. Rule out pulmonary embolism. EXAM: CT ANGIOGRAPHY CHEST WITH CONTRAST TECHNIQUE: Multidetector CT imaging of the chest was performed using the standard protocol during bolus administration of intravenous contrast. Multiplanar CT image reconstructions and MIPs were obtained to evaluate the vascular anatomy. CONTRAST:  69mL ISOVUE-370 IOPAMIDOL (ISOVUE-370) INJECTION 76% COMPARISON:  04/09/2017 radiograph FINDINGS: Cardiovascular: Negative for pulmonary embolism. Pulmonary artery is mildly enlarged. Heart size normal. No pericardial effusion. Mild coronary calcification. Mild atherosclerotic calcification in the thoracic aorta without aneurysm or dissection. Mediastinum/Nodes: Negative for mass or adenopathy. Small hiatal hernia. Lungs/Pleura: Negative for infiltrate effusion or mass. Lungs are clear. Upper Abdomen: Negative Musculoskeletal: Lower thoracic  vertebral spurring. No fracture or bone lesion. Review of the MIP images confirms the above findings. IMPRESSION: Negative for pulmonary embolism. Mild pulmonary artery enlargement suggesting pulmonary artery hypertension. No acute abnormality in the chest. Aortic Atherosclerosis (ICD10-I70.0). Electronically Signed   By: Franchot Gallo M.D.   On: 04/09/2017 12:52   US Venous Img Lower Unilateral Right  Result Date: 03/21/2017 CLINICAL DATA:  Right lower extremity pain and edema for 2 months. History of prior right knee arthroplasty. EXAM: RIGHT LOWER EXTREMITY VENOUS DOPPLER ULTRASOUND TECHNIQUE: Gray-scale sonography with graded compression, as well as color Doppler and duplex ultrasound were performed to evaluate the lower extremity deep venous systems from the level of the common femoral vein and including the common femoral, femoral, profunda femoral, popliteal and calf veins including the posterior tibial, peroneal and gastrocnemius veins  when visible. The superficial great saphenous vein was also interrogated. Spectral Doppler was utilized to evaluate flow at rest and with distal augmentation maneuvers in the common femoral, femoral and popliteal veins. COMPARISON:  None. FINDINGS: Contralateral Common Femoral Vein: Respiratory phasicity is normal and symmetric with the symptomatic side. No evidence of thrombus. Normal compressibility. Common Femoral Vein: No evidence of thrombus. Normal compressibility, respiratory phasicity and response to augmentation. Saphenofemoral Junction: No evidence of thrombus. Normal compressibility and flow on color Doppler imaging. Profunda Femoral Vein: No evidence of thrombus. Normal compressibility and flow on color Doppler imaging. Femoral Vein: No evidence of thrombus. Normal compressibility, respiratory phasicity and response to augmentation. Popliteal Vein: No evidence of thrombus. Normal compressibility, respiratory phasicity and response to augmentation. Calf Veins:  No evidence of thrombus. Normal compressibility and flow on color Doppler imaging. Superficial Great Saphenous Vein: No evidence of thrombus. Normal compressibility. Venous Reflux:  None. Other Findings: No evidence of superficial thrombophlebitis or abnormal fluid collection. IMPRESSION: No evidence of right lower extremity deep venous thrombosis. Electronically Signed   By: Aletta Edouard M.D.   On: 03/21/2017 16:56   Dg Chest Portable 1 View  Result Date: 04/09/2017 CLINICAL DATA:  Shortness of breath and productive cough. EXAM: PORTABLE CHEST 1 VIEW COMPARISON:  12/11/2011 FINDINGS: The heart size and mediastinal contours are within normal limits. Slight calcification in the arch of the aorta. No effusions. Both lungs are clear. The visualized skeletal structures are unremarkable. IMPRESSION: No acute abnormality. Aortic Atherosclerosis (ICD10-I70.0). Electronically Signed   By: Lorriane Shire M.D.   On: 04/09/2017 11:06    Wt Readings from Last 3 Encounters:  04/09/17 127.3 kg (280 lb 10.3 oz)  04/27/15 127 kg (280 lb)  04/14/15 127 kg (280 lb)    EKG: sinus tachycardia with no ischemia.  Physical Exam:  Blood pressure (!) 154/72, pulse (!) 103, temperature 97.9 F (36.6 C), temperature source Oral, resp. rate 20, height 5\' 10"  (1.778 m), weight 127.3 kg (280 lb 10.3 oz), SpO2 96 %. Body mass index is 40.27 kg/m. General: Well developed, well nourished, in no acute distress. Head: Normocephalic, atraumatic, sclera non-icteric, no xanthomas, nares are without discharge.  Neck: Negative for carotid bruits. JVD not elevated. Lungs: Clear bilaterally to auscultation without wheezes, rales, or rhonchi. Breathing is unlabored. Heart: RRR with S1 S2. No murmurs, rubs, or gallops appreciated. Abdomen: Soft, non-tender, non-distended with normoactive bowel sounds. No hepatomegaly. No rebound/guarding. No obvious abdominal masses. Msk:  Strength and tone appear normal for age. Extremities: No  clubbing or cyanosis. No edema.  Distal pedal pulses are 2+ and equal bilaterally. Neuro: Alert and oriented X 3. No facial asymmetry. No focal deficit. Moves all extremities spontaneously. Psych:  Responds to questions appropriately with a normal affect.     Assessment and Plan  72 yo female with history of hypertension, and chest pain who awoke earlier this am with chest pain. EKG is unremarkable for ischemia. Trivial troponin elevation which is likely due to demand ischemia in patient with accelrated hyperension. Will continue to follw troponin and symptoms. Plan functional study in am if stable over night as troponin is not incrreasing. If there is further pain or change in troponin, will proceed with crdiac cath.   Signed, Teodoro Spray MD 04/09/2017, 8:55 PM Pager: [phone]

## 2017-04-09 NOTE — Progress Notes (Signed)
Family Meeting Note  Advance Directive:NO  Today a meeting took place with the patient and her family members.   The following were discussed:Patient's diagnosis: , Patient's progosis: She will be admitted with chest pressure shortness of breath cough in the setting of uncontrolled blood pressure.  CODE STATUS was discussed with patient.  She wishes to be full code. Time spent during discussion 16 mins  Fritzi Mandes, MD

## 2017-04-09 NOTE — ED Provider Notes (Signed)
[facility] Emergency Department Provider Note  ____________________________________________  Time seen: Approximately 11:05 AM  I have reviewed the triage vital signs and the nursing notes.   HISTORY  Chief Complaint Shortness of Breath   HPI Brianna Dalton is a 72 y.o. female with a history of the hypertension, hypothyroidism, hyperlipidemia, GERD who presents for evaluation of shortness of breath. Patient reports that she has had a cough productive of clear phlegm since yesterday. This morning she woke up complaining of shortness of breath. She endorses history of asthma as a child but none as an adult. No history of COPD or smoking. She also is complaining of a dull pressure-like pain located in the center of her chest which has been constant since this morning, 8 out of 10 associated with dizziness. The pain is not pleuritic in nature. Patient has had asymmetric leg swelling for several weeks with right greater than left. Had an ultrasound done 4 days ago which was negative for DVT. She denies personal or family history of blood clots, exogenous hormones, history of cancer, recent immobilization, or hemoptysis.Patient denies no personal history of smoking or heart disease.  Past Medical History:  Diagnosis Date  . Angina pectoris syndrome ([place])   . Arterial hemorrhage    Lt ankle possible venous bleed  . Arthritis   . Hypertension   . Hypothyroidism   . Neuropathy   . Varicose vein of leg     Patient Active Problem List   Diagnosis Date Noted  . 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 S/P total knee arthroplasty 04/27/2015  . Lumbar radiculopathy 07/07/2013  . Acid reflux 07/05/2013  . BP (high blood pressure) 07/05/2013  . HLD (hyperlipidemia) 07/05/2013  . Adult hypothyroidism 07/05/2013  . Morbid obesity ([place]) 07/05/2013  . Impaired renal function 07/05/2013    Past Surgical History:  Procedure Laterality Date  . ABDOMINAL HYSTERECTOMY    .  BREAST BIOPSY Right 01/19/2013   stereo biopsy of two areas neg  . JOINT REPLACEMENT     lt knee  . KNEE ARTHROPLASTY Right 04/27/2015   Procedure: COMPUTER ASSISTED TOTAL KNEE ARTHROPLASTY;  Surgeon: Dereck Leep, MD;  Location: [facility] ORS;  Service: Orthopedics;  Laterality: Right;  . SHOULDER ARTHROSCOPY Left   . THYROID SURGERY Bilateral   . TUMOR REMOVAL Right    neck    Prior to Admission medications   Medication Sig Start Date End Date Taking? Authorizing Provider  allopurinol (ZYLOPRIM) 300 MG tablet Take 300 mg by mouth daily. 03/11/17  Yes [provider]  enalapril (VASOTEC) 20 MG tablet Take 20 mg by mouth daily.   Yes [provider]  levothyroxine (SYNTHROID, LEVOTHROID) 150 MCG tablet Take 150 mcg by mouth daily before breakfast.   Yes [provider]  metoprolol succinate (TOPROL-XL) 50 MG 24 hr tablet Take 25 mg by mouth daily.  02/26/17  Yes [provider]  enoxaparin (LOVENOX) 30 MG/0.3ML injection Inject 0.3 mLs (30 mg total) into the skin every 12 (twelve) hours. Patient not taking: Reported on 04/09/2017 04/28/15   Watt Climes, PA  tapentadol (NUCYNTA) 50 MG TABS tablet Take 1-2 tablets (50-100 mg total) by mouth every 4 (four) hours as needed for moderate pain. Patient not taking: Reported on 04/09/2017 04/28/15   Watt Climes, PA  torsemide (DEMADEX) 10 MG tablet Take 10 mg by mouth daily as needed.    [provider]  traMADol (ULTRAM) 50 MG tablet Take 1-2 tablets (50-100 mg  total) by mouth every 4 (four) hours as needed for moderate pain. 04/28/15   Watt Climes, PA    Allergies Amlodipine; Hydrocodone; Latex; Morphine and related; and Penicillins  No family history on file.  Social History Social History   Tobacco Use  . Smoking status: Never Smoker  Substance Use Topics  . Alcohol use: No  . Drug use: No    Review of Systems  Constitutional: Negative for fever. Eyes: Negative for visual changes. ENT: Negative  for sore throat. Neck: No neck pain  Cardiovascular: + chest pain. Respiratory: + shortness of breath, cough Gastrointestinal: Negative for abdominal pain, vomiting or diarrhea. Genitourinary: Negative for dysuria. Musculoskeletal: Negative for back pain. Skin: Negative for rash. Neurological: Negative for headaches, weakness or numbness. Psych: No SI or HI  ____________________________________________   PHYSICAL EXAM:  VITAL SIGNS: ED Triage Vitals  Enc Vitals Group     BP 04/09/17 1045 (!) 150/101     Pulse Rate 04/09/17 1045 (!) 122     Resp 04/09/17 1045 19     Temp 04/09/17 1045 98 F (36.7 C)     Temp Source 04/09/17 1045 Oral     SpO2 04/09/17 1045 95 %     Weight 04/09/17 1046 270 lb (122.5 kg)     Height 04/09/17 1046 5\' 10"  (1.778 m)     Head Circumference --      Peak Flow --      Pain Score 04/09/17 1045 8     Pain Loc --      Pain Edu? --      Excl. in [place]? --     Constitutional: Alert and oriented. Well appearing and in no apparent distress. HEENT:      Head: Normocephalic and atraumatic.         Eyes: Conjunctivae are normal. Sclera is non-icteric.       Mouth/Throat: Mucous membranes are moist.       Neck: Supple with no signs of meningismus. Cardiovascular: tachycardic with regular rhythm. No murmurs, gallops, or rubs. 2+ symmetrical distal pulses are present in all extremities. No JVD. Respiratory: normal breathing, normal sats, slightly decreased air movement with faint expiratory wheezes Gastrointestinal: Soft, non tender, and non distended with positive bowel sounds. No rebound or guarding. Musculoskeletal: 1+ pitting edema on the R and trace on the L Neurologic: Normal speech and language. Face is symmetric. Moving all extremities. No gross focal neurologic deficits are appreciated. Skin: Skin is warm, dry and intact. No rash noted. Psychiatric: Mood and affect are normal. Speech and behavior are  normal.  ____________________________________________   LABS (all labs ordered are listed, but only abnormal results are displayed)  Labs Reviewed  BASIC METABOLIC PANEL - Abnormal; Notable for the following components:      Result Value   Glucose, Bld 141 (*)    All other components within normal limits  CBC - Abnormal; Notable for the following components:   RDW 14.6 (*)    All other components within normal limits  TROPONIN I - Abnormal; Notable for the following components:   Troponin I 0.03 (*)    All other components within normal limits  TROPONIN I - Abnormal; Notable for the following components:   Troponin I 0.05 (*)    All other components within normal limits  APTT  PROTIME-INR  HEPARIN LEVEL (UNFRACTIONATED)   ____________________________________________  EKG  ED ECG REPORT I, Rudene Re, the attending physician, personally viewed and interpreted this ECG.  Sinus  tachycardia, rate of 122, normal intervals, right axis deviation, no ST elevations or depressions. tachycardia and axis deviation are new when compared to prior ____________________________________________  RADIOLOGY  I have personally reviewed the images performed during this visit and I agree with the Radiologist's read.   Interpretation by Radiologist:  Ct Angio Chest Pe W And/or Wo Contrast  Result Date: 04/09/2017 CLINICAL DATA:  Short of breath and cough. Rule out pulmonary embolism. EXAM: CT ANGIOGRAPHY CHEST WITH CONTRAST TECHNIQUE: Multidetector CT imaging of the chest was performed using the standard protocol during bolus administration of intravenous contrast. Multiplanar CT image reconstructions and MIPs were obtained to evaluate the vascular anatomy. CONTRAST:  24mL ISOVUE-370 IOPAMIDOL (ISOVUE-370) INJECTION 76% COMPARISON:  04/09/2017 radiograph FINDINGS: Cardiovascular: Negative for pulmonary embolism. Pulmonary artery is mildly enlarged. Heart size normal. No pericardial  effusion. Mild coronary calcification. Mild atherosclerotic calcification in the thoracic aorta without aneurysm or dissection. Mediastinum/Nodes: Negative for mass or adenopathy. Small hiatal hernia. Lungs/Pleura: Negative for infiltrate effusion or mass. Lungs are clear. Upper Abdomen: Negative Musculoskeletal: Lower thoracic vertebral spurring. No fracture or bone lesion. Review of the MIP images confirms the above findings. IMPRESSION: Negative for pulmonary embolism. Mild pulmonary artery enlargement suggesting pulmonary artery hypertension. No acute abnormality in the chest. Aortic Atherosclerosis (ICD10-I70.0). Electronically Signed   By: Franchot Gallo M.D.   On: 04/09/2017 12:52   Dg Chest Portable 1 View  Result Date: 04/09/2017 CLINICAL DATA:  Shortness of breath and productive cough. EXAM: PORTABLE CHEST 1 VIEW COMPARISON:  12/11/2011 FINDINGS: The heart size and mediastinal contours are within normal limits. Slight calcification in the arch of the aorta. No effusions. Both lungs are clear. The visualized skeletal structures are unremarkable. IMPRESSION: No acute abnormality. Aortic Atherosclerosis (ICD10-I70.0). Electronically Signed   By: Lorriane Shire M.D.   On: 04/09/2017 11:06     ____________________________________________   PROCEDURES  Procedure(s) performed: None Procedures Critical Care performed: yes  CRITICAL CARE Performed by: Rudene Re  ?  Total critical care time: 35 min  Critical care time was exclusive of separately billable procedures and treating other patients.  Critical care was necessary to treat or prevent imminent or life-threatening deterioration.  Critical care was time spent personally by me on the following activities: development of treatment plan with patient and/or surrogate as well as nursing, discussions with consultants, evaluation of patient's response to treatment, examination of patient, obtaining history from patient or surrogate,  ordering and performing treatments and interventions, ordering and review of laboratory studies, ordering and review of radiographic studies, pulse oximetry and re-evaluation of patient's condition.  ____________________________________________   INITIAL IMPRESSION / ASSESSMENT AND PLAN / ED COURSE   72 y.o. female with a history of the hypertension, hypothyroidism, hyperlipidemia, GERD who presents for evaluation of shortness of breath, cough, and cp. patient normal work of breathing, slight decreased air movement with some faint expiratory wheezes. EKG shows sinus tachycardia. Chest x-ray showed no evidence of pneumonia or pneumothorax. We'll send patient for CT angiogram to rule out PE. EKG with no ischemic changes. Will cycle troponin, get labs, give duoneb and fentanyl for pain.     _________________________ 3:01 PM on 04/09/2017 -----------------------------------------  patient reports some relief after receiving a DuoNeb but continues to endorse pressure in the center of her chest. Lungs continue to sound tight with reduced air movement and wheezing. Her second troponin is slightly elevated when compared to the first one (0.03 and 0.05 respectively) which makes me more concerned for a cardiac etiology.  Nitro and ASA ordered. Also ordered 2 more duonebs and doxy for bronchitis. CTA negative for PE or PNA. Plan for admission.  _________________________ 3:27 PM on 04/09/2017 -----------------------------------------  Patient reports improve of her chest pressure after 1 sublingual nitro. Currently receiving two more duonebs. Will give another nitro and start patient on heparin. Presentation concerning for NSTEMI and bronchitis.    As part of my medical decision making, I reviewed the following data within the [place] notes reviewed and incorporated, Labs reviewed , EKG interpreted , Old EKG reviewed, Radiograph reviewed , Notes from prior ED visits and [employer]  Controlled Substance Database    Pertinent labs & imaging results that were available during my care of the patient were reviewed by me and considered in my medical decision making (see chart for details).    ____________________________________________   FINAL CLINICAL IMPRESSION(S) / ED DIAGNOSES  Final diagnoses:  Bronchitis  NSTEMI (non-ST elevated myocardial infarction) ([place])      NEW MEDICATIONS STARTED DURING THIS VISIT:  ED Discharge Orders    None       Note:  This document was prepared using Dragon voice recognition software and may include unintentional dictation errors.    Alfred Levins, [place], MD 04/09/17 [phone]

## 2017-04-09 NOTE — Progress Notes (Addendum)
ANTICOAGULATION CONSULT NOTE - Initial Consult  Pharmacy Consult for heparin Indication: chest pain/ACS  Allergies  Allergen Reactions  . Amlodipine   . Hydrocodone Hives  . Latex     NEGATIVE BY IgE (<0.10)  . Morphine And Related Other (See Comments)    Nausea delusion   . Penicillins Hives    Has patient had a PCN reaction causing immediate rash, facial/tongue/throat swelling, SOB or lightheadedness with hypotension: Yes Has patient had a PCN reaction causing severe rash involving mucus membranes or skin necrosis: No Has patient had a PCN reaction that required hospitalization: No Has patient had a PCN reaction occurring within the last 10 years: No If all of the above answers are "NO", then may proceed with Cephalosporin use.    Patient Measurements: Height: 5\' 10"  (177.8 cm) Weight: 270 lb (122.5 kg) IBW/kg (Calculated) : 68.5 Heparin Dosing Weight: 96.7 kg  Vital Signs: Temp: 98 F (36.7 C) (03/19 1045) Temp Source: Oral (03/19 1045) BP: 163/86 (03/19 1515) Pulse Rate: 117 (03/19 1515)  Labs: Recent Labs    04/09/17 1056 04/09/17 1400  HGB 14.3  --   HCT 43.2  --   PLT 192  --   CREATININE 0.75  --   TROPONINI 0.03* 0.05*    Estimated Creatinine Clearance: 93.1 mL/min (by C-G formula based on SCr of 0.75 mg/dL).   Medical History: Past Medical History:  Diagnosis Date  . Angina pectoris syndrome ([place])   . Arterial hemorrhage    Lt ankle possible venous bleed  . Arthritis   . Hypertension   . Hypothyroidism   . Neuropathy   . Varicose vein of leg     Medications:  Infusions:  . heparin    . sodium chloride      Assessment: 71 yof cc SOB with PMH HTN, hypothyroid, HLD, GERD. HPI woke up with SOB/coughing. Associated dull pressure-like pain in center of chest with dizziness. Troponin 0.03 -- 0.05 --. CT negative for PE. ECG with sinus tachycardia, right axis deviation, and borderline prolonged QT interval. Pharmacy consulted to dose heparin  for ACS. No PTA OAC listed (enoxaparin on profile is from 2017).   Goal of Therapy:  Heparin level 0.3-0.7 units/ml Monitor platelets by anticoagulation protocol: Yes   Plan:  Give 4000 units bolus x 1 Start heparin infusion at 1200 units/hr Check anti-Xa level in 8 hours and daily while on heparin Continue to monitor H&H and platelets   04/09/17 1738 baseline aPTT elevated. It was drawn after infusion began. Continue as ordered, check HL per protocol.   Laural Benes, Pharm.D., BCPS Clinical Pharmacist 04/09/2017,3:40 PM

## 2017-04-09 NOTE — ED Notes (Signed)
Floor notified that this RN was on the way to do bedside reporting

## 2017-04-09 NOTE — ED Triage Notes (Signed)
Pt arrived via ems with complaints of shortness of breath and productive cough since yesterday. EMS reports initial O2 reading of 85% and pt given 125mg  of solu medrol and 1 duo neb in route. Upon arrival to ED pt remains short of breath with a room air saturation of 93%.

## 2017-04-10 ENCOUNTER — Other Ambulatory Visit: Payer: Medicare HMO

## 2017-04-10 ENCOUNTER — Encounter
Admission: EM | Disposition: A | Discharge status: Home / Self Care | Payer: Self-pay | Source: Home / Self Care | Attending: Emergency Medicine

## 2017-04-10 ENCOUNTER — Encounter: Payer: Self-pay | Admitting: Cardiology

## 2017-04-10 DIAGNOSIS — R079 Chest pain, unspecified: Secondary | ICD-10-CM | POA: Diagnosis not present

## 2017-04-10 DIAGNOSIS — I10 Essential (primary) hypertension: Secondary | ICD-10-CM | POA: Diagnosis not present

## 2017-04-10 DIAGNOSIS — R748 Abnormal levels of other serum enzymes: Secondary | ICD-10-CM | POA: Diagnosis not present

## 2017-04-10 DIAGNOSIS — R05 Cough: Secondary | ICD-10-CM | POA: Diagnosis not present

## 2017-04-10 DIAGNOSIS — J209 Acute bronchitis, unspecified: Secondary | ICD-10-CM | POA: Diagnosis not present

## 2017-04-10 HISTORY — PX: LEFT HEART CATH AND CORONARY ANGIOGRAPHY: CATH118249

## 2017-04-10 LAB — CBC
HCT: 41.1 % (ref 35.0–47.0)
Hemoglobin: 13.5 g/dL (ref 12.0–16.0)
MCH: 29.4 pg (ref 26.0–34.0)
MCHC: 32.9 g/dL (ref 32.0–36.0)
MCV: 89.4 fL (ref 80.0–100.0)
Platelets: 230 10*3/uL (ref 150–440)
RBC: 4.6 MIL/uL (ref 3.80–5.20)
RDW: 14.8 % — ABNORMAL HIGH (ref 11.5–14.5)
WBC: 14.4 10*3/uL — ABNORMAL HIGH (ref 3.6–11.0)

## 2017-04-10 LAB — HEPARIN LEVEL (UNFRACTIONATED)
Heparin Unfractionated: 0.23 IU/mL — ABNORMAL LOW (ref 0.30–0.70)
Heparin Unfractionated: 0.44 IU/mL (ref 0.30–0.70)

## 2017-04-10 LAB — TROPONIN I
Troponin I: 0.03 ng/mL (ref <0.03)
Troponin I: 0.03 ng/mL (ref <0.03)

## 2017-04-10 SURGERY — LEFT HEART CATH AND CORONARY ANGIOGRAPHY
Anesthesia: Moderate Sedation

## 2017-04-10 MED ORDER — METOPROLOL SUCCINATE ER 50 MG PO TB24: 50.0000 mg | ORAL_TABLET | Freq: Every day | ORAL | Status: DC

## 2017-04-10 MED ORDER — ONDANSETRON HCL 4 MG/2ML IJ SOLN: 4.0000 mg | Freq: Four times a day (QID) | INTRAMUSCULAR | Status: DC | PRN

## 2017-04-10 MED ORDER — MIDAZOLAM HCL 2 MG/2ML IJ SOLN
INTRAMUSCULAR | Status: DC | PRN
  Administered 2017-04-10: 1.5 mg via INTRAVENOUS

## 2017-04-10 MED ORDER — SODIUM CHLORIDE 0.9% FLUSH: 3.0000 mL | Freq: Two times a day (BID) | INTRAVENOUS | Status: DC

## 2017-04-10 MED ORDER — SODIUM CHLORIDE 0.9 % WEIGHT BASED INFUSION
3.0000 mL/kg/h | INTRAVENOUS | Status: DC
  Administered 2017-04-10: 3 mL/kg/h via INTRAVENOUS

## 2017-04-10 MED ORDER — DOXYCYCLINE HYCLATE 100 MG PO TABS
100.0000 mg | ORAL_TABLET | Freq: Two times a day (BID) | ORAL | Status: DC
Start: 2017-04-10 — End: 2017-04-10
  Administered 2017-04-10: 100 mg via ORAL
  Filled 2017-04-10: qty 1

## 2017-04-10 MED ORDER — SODIUM CHLORIDE 0.9 % WEIGHT BASED INFUSION: 1.0000 mL/kg/h | INTRAVENOUS | Status: DC

## 2017-04-10 MED ORDER — IOPAMIDOL (ISOVUE-300) INJECTION 61%
INTRAVENOUS | Status: DC | PRN
  Administered 2017-04-10: 85 mL via INTRA_ARTERIAL

## 2017-04-10 MED ORDER — ASPIRIN 81 MG PO CHEW: 81.0000 mg | CHEWABLE_TABLET | ORAL | Status: DC

## 2017-04-10 MED ORDER — HEPARIN (PORCINE) IN NACL 2-0.9 UNIT/ML-% IJ SOLN
INTRAMUSCULAR | Status: AC
  Filled 2017-04-10: qty 500

## 2017-04-10 MED ORDER — DOXYCYCLINE HYCLATE 100 MG PO TABS: 100.0000 mg | ORAL_TABLET | Freq: Two times a day (BID) | ORAL | 0 refills | Status: DC

## 2017-04-10 MED ORDER — SODIUM CHLORIDE 0.9% FLUSH: 3.0000 mL | INTRAVENOUS | Status: DC | PRN

## 2017-04-10 MED ORDER — ACETAMINOPHEN 325 MG PO TABS: 650.0000 mg | ORAL_TABLET | ORAL | Status: DC | PRN

## 2017-04-10 MED ORDER — MIDAZOLAM HCL 2 MG/2ML IJ SOLN
INTRAMUSCULAR | Status: AC
  Filled 2017-04-10: qty 2

## 2017-04-10 MED ORDER — HEPARIN BOLUS VIA INFUSION
1500.0000 [IU] | Freq: Once | INTRAVENOUS | Status: AC
  Administered 2017-04-10: 1500 [IU] via INTRAVENOUS
  Filled 2017-04-10: qty 1500

## 2017-04-10 MED ORDER — SODIUM CHLORIDE 0.9 % IV SOLN: 250.0000 mL | INTRAVENOUS | Status: DC | PRN

## 2017-04-10 MED ORDER — FENTANYL CITRATE (PF) 100 MCG/2ML IJ SOLN
INTRAMUSCULAR | Status: AC
  Filled 2017-04-10: qty 2

## 2017-04-10 MED ORDER — METOPROLOL SUCCINATE ER 50 MG PO TB24: 50.0000 mg | ORAL_TABLET | Freq: Every day | ORAL | 2 refills | Status: DC

## 2017-04-10 MED ORDER — SODIUM CHLORIDE 0.9% FLUSH
3.0000 mL | INTRAVENOUS | Status: DC | PRN
Start: 2017-04-10 — End: 2017-04-10
  Administered 2017-04-10: 3 mL via INTRAVENOUS
  Filled 2017-04-10: qty 3

## 2017-04-10 SURGICAL SUPPLY — 9 items
CATH INFINITI 5FR ANG PIGTAIL (CATHETERS) ×2 IMPLANT
CATH INFINITI 5FR JL4 (CATHETERS) ×2 IMPLANT
CATH INFINITI 5FR JL5 (CATHETERS) ×2 IMPLANT
CATH INFINITI JR4 5F (CATHETERS) ×2 IMPLANT
DEVICE CLOSURE MYNXGRIP 5F (Vascular Products) ×2 IMPLANT
KIT MANI 3VAL PERCEP (MISCELLANEOUS) ×2 IMPLANT
PACK CARDIAC CATH (CUSTOM PROCEDURE TRAY) ×2 IMPLANT
SHEATH AVANTI 5FR X 11CM (SHEATH) ×2 IMPLANT
WIRE GUIDERIGHT .035X150 (WIRE) ×2 IMPLANT

## 2017-04-10 NOTE — Progress Notes (Addendum)
Pt up for d/c per MD order. Brother at beside. R groin wnl, no bleeding or hematoma. Pt ambulated around the nurses station and tolerated well.Tele box removed and returned, IV removed and intact. Pt discharge via wheelchair.

## 2017-04-10 NOTE — Progress Notes (Signed)
Patient Name: Brianna Dalton Date of Encounter: 04/10/2017  Hospital Problem List     Active Problems:   Chest pain    Patient Profile     Pt with hypertension admitted with chest pain and elevated blood pressure. Trivial troponin elevationi  Subjective   No further chest pain  Inpatient Medications    . [MAR Hold] allopurinol  300 mg Oral Daily  . [START ON 04/11/2017] aspirin  81 mg Oral Pre-Cath  . [MAR Hold] enalapril  20 mg Oral Daily  . [MAR Hold] levothyroxine  150 mcg Oral QAC breakfast  . [MAR Hold] metoprolol succinate  25 mg Oral Daily  . sodium chloride flush  3 mL Intravenous Q12H    Vital Signs    Vitals:   04/09/17 1944 04/10/17 0425 04/10/17 0801 04/10/17 1004  BP: (!) 154/72 (!) 163/93 (!) 155/88 (!) 176/107  Pulse: (!) 103 94 93   Resp:   18 (!) 26  Temp:  (!) 97.4 F (36.3 C) (!) 97.4 F (36.3 C) 97.8 F (36.6 C)  TempSrc:  Oral Oral Oral  SpO2: 96% 97% 96% 96%  Weight:      Height:        Intake/Output Summary (Last 24 hours) at 04/10/2017 1057 Last data filed at 04/10/2017 0925 Gross per 24 hour  Intake 141.53 ml  Output 2400 ml  Net -2258.47 ml   Filed Weights   04/09/17 1046 04/09/17 1738  Weight: 122.5 kg (270 lb) 127.3 kg (280 lb 10.3 oz)    Physical Exam    GEN: Well nourished, well developed, in no acute distress.  HEENT: normal.  Neck: Supple, no JVD, carotid bruits, or masses. Cardiac: RRR, no murmurs, rubs, or gallops. No clubbing, cyanosis, edema.  Radials/DP/PT 2+ and equal bilaterally.  Respiratory:  Respirations regular and unlabored, clear to auscultation bilaterally. GI: Soft, nontender, nondistended, BS + x 4. MS: no deformity or atrophy. Skin: warm and dry, no rash. Neuro:  Strength and sensation are intact. Psych: Normal affect.  Labs    CBC Recent Labs    04/09/17 1056 04/10/17 0827  WBC 10.6 14.4*  HGB 14.3 13.5  HCT 43.2 41.1  MCV 89.0 89.4  PLT 192 235   Basic Metabolic Panel Recent Labs     04/09/17 1056  NA 138  K 3.6  CL 104  CO2 23  GLUCOSE 141*  BUN 10  CREATININE 0.75  CALCIUM 8.9   Liver Function Tests No results for input(s): AST, ALT, ALKPHOS, BILITOT, PROT, ALBUMIN in the last 72 hours. No results for input(s): LIPASE, AMYLASE in the last 72 hours. Cardiac Enzymes Recent Labs    04/09/17 1738 04/09/17 2350 04/10/17 0603  TROPONINI 0.05* 0.03* 0.03*   BNP No results for input(s): BNP in the last 72 hours. D-Dimer No results for input(s): DDIMER in the last 72 hours. Hemoglobin A1C No results for input(s): HGBA1C in the last 72 hours. Fasting Lipid Panel No results for input(s): CHOL, HDL, LDLCALC, TRIG, CHOLHDL, LDLDIRECT in the last 72 hours. Thyroid Function Tests No results for input(s): TSH, T4TOTAL, T3FREE, THYROIDAB in the last 72 hours.  Invalid input(s): FREET3  Telemetry    nsr  ECG    nsr with no ischemia  Radiology    Ct Angio Chest Pe W And/or Wo Contrast  Result Date: 04/09/2017 CLINICAL DATA:  Short of breath and cough. Rule out pulmonary embolism. EXAM: CT ANGIOGRAPHY CHEST WITH CONTRAST TECHNIQUE: Multidetector CT imaging of the chest  was performed using the standard protocol during bolus administration of intravenous contrast. Multiplanar CT image reconstructions and MIPs were obtained to evaluate the vascular anatomy. CONTRAST:  31mL ISOVUE-370 IOPAMIDOL (ISOVUE-370) INJECTION 76% COMPARISON:  04/09/2017 radiograph FINDINGS: Cardiovascular: Negative for pulmonary embolism. Pulmonary artery is mildly enlarged. Heart size normal. No pericardial effusion. Mild coronary calcification. Mild atherosclerotic calcification in the thoracic aorta without aneurysm or dissection. Mediastinum/Nodes: Negative for mass or adenopathy. Small hiatal hernia. Lungs/Pleura: Negative for infiltrate effusion or mass. Lungs are clear. Upper Abdomen: Negative Musculoskeletal: Lower thoracic vertebral spurring. No fracture or bone lesion. Review of the MIP  images confirms the above findings. IMPRESSION: Negative for pulmonary embolism. Mild pulmonary artery enlargement suggesting pulmonary artery hypertension. No acute abnormality in the chest. Aortic Atherosclerosis (ICD10-I70.0). Electronically Signed   By: Franchot Gallo M.D.   On: 04/09/2017 12:52   US Venous Img Lower Unilateral Right  Result Date: 03/21/2017 CLINICAL DATA:  Right lower extremity pain and edema for 2 months. History of prior right knee arthroplasty. EXAM: RIGHT LOWER EXTREMITY VENOUS DOPPLER ULTRASOUND TECHNIQUE: Gray-scale sonography with graded compression, as well as color Doppler and duplex ultrasound were performed to evaluate the lower extremity deep venous systems from the level of the common femoral vein and including the common femoral, femoral, profunda femoral, popliteal and calf veins including the posterior tibial, peroneal and gastrocnemius veins when visible. The superficial great saphenous vein was also interrogated. Spectral Doppler was utilized to evaluate flow at rest and with distal augmentation maneuvers in the common femoral, femoral and popliteal veins. COMPARISON:  None. FINDINGS: Contralateral Common Femoral Vein: Respiratory phasicity is normal and symmetric with the symptomatic side. No evidence of thrombus. Normal compressibility. Common Femoral Vein: No evidence of thrombus. Normal compressibility, respiratory phasicity and response to augmentation. Saphenofemoral Junction: No evidence of thrombus. Normal compressibility and flow on color Doppler imaging. Profunda Femoral Vein: No evidence of thrombus. Normal compressibility and flow on color Doppler imaging. Femoral Vein: No evidence of thrombus. Normal compressibility, respiratory phasicity and response to augmentation. Popliteal Vein: No evidence of thrombus. Normal compressibility, respiratory phasicity and response to augmentation. Calf Veins: No evidence of thrombus. Normal compressibility and flow on color  Doppler imaging. Superficial Great Saphenous Vein: No evidence of thrombus. Normal compressibility. Venous Reflux:  None. Other Findings: No evidence of superficial thrombophlebitis or abnormal fluid collection. IMPRESSION: No evidence of right lower extremity deep venous thrombosis. Electronically Signed   By: Aletta Edouard M.D.   On: 03/21/2017 16:56   Dg Chest Portable 1 View  Result Date: 04/09/2017 CLINICAL DATA:  Shortness of breath and productive cough. EXAM: PORTABLE CHEST 1 VIEW COMPARISON:  12/11/2011 FINDINGS: The heart size and mediastinal contours are within normal limits. Slight calcification in the arch of the aorta. No effusions. Both lungs are clear. The visualized skeletal structures are unremarkable. IMPRESSION: No acute abnormality. Aortic Atherosclerosis (ICD10-I70.0). Electronically Signed   By: Lorriane Shire M.D.   On: 04/09/2017 11:06    Assessment & Plan    Chest pain-Trivial troponin elevation likely secondary to accelerated hypertension. Cardiac cath revealed normal coronary arteries and normal lv function. Continue aggressive treatment of blood pressure and risk factor modification to keep ldl less than 100, average blood pressure 130/80. No further cardiac workup indicated.   Hypertension-intolerance of amlodipine. Continue with enalapril 20 mg, metoprolol succinate 25 mg daily. Weight loss and low sodium diet.   Signed, Javier Docker Fath MD 04/10/2017, 10:57 AM  Pager: [phone]

## 2017-04-10 NOTE — Plan of Care (Signed)
  Progressing Activity: Ability to tolerate increased activity will improve 04/10/2017 0220 - Progressing by Loran Senters, RN Cardiac: Ability to achieve and maintain adequate cardiovascular perfusion will improve 04/10/2017 0220 - Progressing by Loran Senters, RN

## 2017-04-10 NOTE — Progress Notes (Signed)
ANTICOAGULATION CONSULT NOTE - Initial Consult  Pharmacy Consult for heparin Indication: chest pain/ACS  Allergies  Allergen Reactions  . Amlodipine   . Hydrocodone Hives  . Latex     NEGATIVE BY IgE (<0.10)  . Morphine And Related Other (See Comments)    Nausea delusion   . Penicillins Hives    Has patient had a PCN reaction causing immediate rash, facial/tongue/throat swelling, SOB or lightheadedness with hypotension: Yes Has patient had a PCN reaction causing severe rash involving mucus membranes or skin necrosis: No Has patient had a PCN reaction that required hospitalization: No Has patient had a PCN reaction occurring within the last 10 years: No If all of the above answers are "NO", then may proceed with Cephalosporin use.    Patient Measurements: Height: 5\' 10"  (177.8 cm) Weight: 280 lb 10.3 oz (127.3 kg) IBW/kg (Calculated) : 68.5 Heparin Dosing Weight: 96.7 kg  Vital Signs: Temp: 97.9 F (36.6 C) (03/19 1942) Temp Source: Oral (03/19 1942) BP: 154/72 (03/19 1944) Pulse Rate: 103 (03/19 1944)  Labs: Recent Labs    04/09/17 1056 04/09/17 1400 04/09/17 1738 04/09/17 2350  HGB 14.3  --   --   --   HCT 43.2  --   --   --   PLT 192  --   --   --   APTT  --   --  72*  --   LABPROT  --   --  14.1  --   INR  --   --  1.10  --   HEPARINUNFRC  --   --   --  0.23*  CREATININE 0.75  --   --   --   TROPONINI 0.03* 0.05* 0.05* 0.03*    Estimated Creatinine Clearance: 95 mL/min (by C-G formula based on SCr of 0.75 mg/dL).   Medical History: Past Medical History:  Diagnosis Date  . Angina pectoris syndrome ([place])   . Arterial hemorrhage    Lt ankle possible venous bleed  . Arthritis   . Hypertension   . Hypothyroidism   . Neuropathy   . Varicose vein of leg     Medications:  Infusions:  . heparin 1,200 Units/hr (04/09/17 1601)  . sodium chloride      Assessment: 71 yof cc SOB with PMH HTN, hypothyroid, HLD, GERD. HPI woke up with SOB/coughing.  Associated dull pressure-like pain in center of chest with dizziness. Troponin 0.03 -- 0.05 --. CT negative for PE. ECG with sinus tachycardia, right axis deviation, and borderline prolonged QT interval. Pharmacy consulted to dose heparin for ACS. No PTA OAC listed (enoxaparin on profile is from 2017).   Goal of Therapy:  Heparin level 0.3-0.7 units/ml Monitor platelets by anticoagulation protocol: Yes   Plan:  Give 4000 units bolus x 1 Start heparin infusion at 1200 units/hr Check anti-Xa level in 8 hours and daily while on heparin Continue to monitor H&H and platelets   04/09/17 1738 baseline aPTT elevated. It was drawn after infusion began. Continue as ordered, check HL per protocol.   03/20 0000 heparin level 0.23. 1500 unit bolus and increase rate to 1400 units/hr. Recheck in 6 hours.   Rufino Staup S, Pharm.D., BCPS Clinical Pharmacist 04/10/2017,1:56 AM

## 2017-04-10 NOTE — Care Management Obs Status (Signed)
MEDICARE OBSERVATION STATUS NOTIFICATION   Patient Details  Name: Brianna Dalton MRN: [id number] Date of Birth: 13-Oct-1945   Medicare Observation Status Notification Given:  Yes    Katrina Stack, RN 04/10/2017, 3:09 PM

## 2017-04-10 NOTE — Progress Notes (Signed)
Discharge instructions explained to pt/ verbalized an understanding/ R groin WNL

## 2017-04-10 NOTE — Discharge Summary (Signed)
[place] [facility] [place] NAME: Brianna Dalton    MR#:  [id number]  DATE OF BIRTH:  Jul 10, 1945  DATE OF ADMISSION:  04/09/2017 ADMITTING PHYSICIAN: Fritzi Mandes, MD  DATE OF DISCHARGE: 04/10/17  PRIMARY CARE PHYSICIAN: Idelle Crouch, MD    ADMISSION DIAGNOSIS:  Bronchitis [J40] NSTEMI (non-ST elevated myocardial infarction) ([place]) [I21.4]  DISCHARGE DIAGNOSIS:  Acute bronchitis Hypoxia transient resolved sats 94% on room air  SECONDARY DIAGNOSIS:   Past Medical History:  Diagnosis Date  . Angina pectoris syndrome ([place])   . Arterial hemorrhage    Lt ankle possible venous bleed  . Arthritis   . Hypertension   . Hypothyroidism   . Neuropathy   . Varicose vein of leg     HOSPITAL COURSE:  Brianna Dalton  is a 72 y.o. female with a known history of hypertension, hypothyroidism comes to the emergency room accompanied by family members with complaints of dry hacking cough along with elevated blood pressure this morning and substernal chest pain.  Patient states pain is constant however after getting 2 nitroglycerin sublingual pain has eased a little bit.  1.  Chest pain with elevated troponin with no acute ST elevation or depression on EKG -Atypical in the setting of acute bronchitis. -IV heparin drip now discontinued. -Cardiac cath within normal limits -Continue aspirin beta blockers ACE inhibitors  -Cardiology consultation with Dr. Ubaldo Glassing appreciated  2.  Dry cough without wheezing or history of COPD--acute bronchitis -Patient received doxycycline in the ER.  -Chest x-ray negative for pneumonia -Patient still has some cough and elevated white count of 14,000.  She is afebrile.  We will give her some doxycycline for 7 days.  3.  Uncontrolled high blood pressure -Continue beta-blockers and Vasotec.  PRN hydralazine if needed -Dose of metoprolol XL was increased to 50 mg daily  4.  DVT prophylaxis Lovenox  Overall improved.   Feels better than yesterday.  Discussed with patient will discharge her to home later today.  Family in the room agreeable.   CONSULTS OBTAINED:  Treatment Team:  Teodoro Spray, MD  DRUG ALLERGIES:   Allergies  Allergen Reactions  . Amlodipine   . Hydrocodone Hives  . Latex     NEGATIVE BY IgE (<0.10)  . Morphine And Related Other (See Comments)    Nausea delusion   . Penicillins Hives    Has patient had a PCN reaction causing immediate rash, facial/tongue/throat swelling, SOB or lightheadedness with hypotension: Yes Has patient had a PCN reaction causing severe rash involving mucus membranes or skin necrosis: No Has patient had a PCN reaction that required hospitalization: No Has patient had a PCN reaction occurring within the last 10 years: No If all of the above answers are "NO", then may proceed with Cephalosporin use.    DISCHARGE MEDICATIONS:   Allergies as of 04/10/2017      Reactions   Amlodipine    Hydrocodone Hives   Latex    NEGATIVE BY IgE (<0.10)   Morphine And Related Other (See Comments)   Nausea delusion   Penicillins Hives   Has patient had a PCN reaction causing immediate rash, facial/tongue/throat swelling, SOB or lightheadedness with hypotension: Yes Has patient had a PCN reaction causing severe rash involving mucus membranes or skin necrosis: No Has patient had a PCN reaction that required hospitalization: No Has patient had a PCN reaction occurring within the last 10 years: No If all of the above answers are "NO",  then may proceed with Cephalosporin use.      Medication List    TAKE these medications   allopurinol 300 MG tablet Commonly known as:  ZYLOPRIM Take 300 mg by mouth daily.   doxycycline 100 MG tablet Commonly known as:  VIBRA-TABS Take 1 tablet (100 mg total) by mouth every 12 (twelve) hours.   enalapril 20 MG tablet Commonly known as:  VASOTEC Take 20 mg by mouth daily.   levothyroxine 150 MCG tablet Commonly known as:   SYNTHROID, LEVOTHROID Take 150 mcg by mouth daily before breakfast.   metoprolol succinate 50 MG 24 hr tablet Commonly known as:  TOPROL-XL Take 1 tablet (50 mg total) by mouth daily. What changed:  how much to take   torsemide 10 MG tablet Commonly known as:  DEMADEX Take 10 mg by mouth daily as needed.   traMADol 50 MG tablet Commonly known as:  ULTRAM Take 1-2 tablets (50-100 mg total) by mouth every 4 (four) hours as needed for moderate pain.       If you experience worsening of your admission symptoms, develop shortness of breath, life threatening emergency, suicidal or homicidal thoughts you must seek medical attention immediately by calling 911 or calling your MD immediately  if symptoms less severe.  You Must read complete instructions/literature along with all the possible adverse reactions/side effects for all the Medicines you take and that have been prescribed to you. Take any new Medicines after you have completely understood and accept all the possible adverse reactions/side effects.   Please note  You were cared for by a hospitalist during your hospital stay. If you have any questions about your discharge medications or the care you received while you were in the hospital after you are discharged, you can call the unit and asked to speak with the hospitalist on call if the hospitalist that took care of you is not available. Once you are discharged, your primary care physician will handle any further medical issues. Please note that NO REFILLS for any discharge medications will be authorized once you are discharged, as it is imperative that you return to your primary care physician (or establish a relationship with a primary care physician if you do not have one) for your aftercare needs so that they can reassess your need for medications and monitor your lab values. Today   SUBJECTIVE   Feels a lot better.  Some cough with congestion  VITAL SIGNS:  Blood pressure (!)  162/82, pulse 82, temperature 97.7 F (36.5 C), temperature source Oral, resp. rate 18, height 5\' 10"  (1.778 m), weight 127.3 kg (280 lb 10.3 oz), SpO2 94 %.  I/O:    Intake/Output Summary (Last 24 hours) at 04/10/2017 1431 Last data filed at 04/10/2017 1422 Gross per 24 hour  Intake 381.53 ml  Output 2400 ml  Net -2018.47 ml    PHYSICAL EXAMINATION:  GENERAL:  72 y.o.-year-old patient lying in the bed with no acute distress.  EYES: Pupils equal, round, reactive to light and accommodation. No scleral icterus. Extraocular muscles intact.  HEENT: Head atraumatic, normocephalic. Oropharynx and nasopharynx clear.  NECK:  Supple, no jugular venous distention. No thyroid enlargement, no tenderness.  LUNGS: Normal breath sounds bilaterally, no wheezing, rales,rhonchi or crepitation. No use of accessory muscles of respiration.  CARDIOVASCULAR: S1, S2 normal. No murmurs, rubs, or gallops.  ABDOMEN: Soft, non-tender, non-distended. Bowel sounds present. No organomegaly or mass.  EXTREMITIES: No pedal edema, cyanosis, or clubbing.  NEUROLOGIC: Cranial nerves II through  XII are intact. Muscle strength 5/5 in all extremities. Sensation intact. Gait not checked.  PSYCHIATRIC: The patient is alert and oriented x 3.  SKIN: No obvious rash, lesion, or ulcer.   DATA REVIEW:   CBC  Recent Labs  Lab 04/10/17 0827  WBC 14.4*  HGB 13.5  HCT 41.1  PLT 230    Chemistries  Recent Labs  Lab 04/09/17 1056  NA 138  K 3.6  CL 104  CO2 23  GLUCOSE 141*  BUN 10  CREATININE 0.75  CALCIUM 8.9    Microbiology Results   No results found for this or any previous visit (from the past 240 hour(s)).  RADIOLOGY:  Ct Angio Chest Pe W And/or Wo Contrast  Result Date: 04/09/2017 CLINICAL DATA:  Short of breath and cough. Rule out pulmonary embolism. EXAM: CT ANGIOGRAPHY CHEST WITH CONTRAST TECHNIQUE: Multidetector CT imaging of the chest was performed using the standard protocol during bolus  administration of intravenous contrast. Multiplanar CT image reconstructions and MIPs were obtained to evaluate the vascular anatomy. CONTRAST:  46mL ISOVUE-370 IOPAMIDOL (ISOVUE-370) INJECTION 76% COMPARISON:  04/09/2017 radiograph FINDINGS: Cardiovascular: Negative for pulmonary embolism. Pulmonary artery is mildly enlarged. Heart size normal. No pericardial effusion. Mild coronary calcification. Mild atherosclerotic calcification in the thoracic aorta without aneurysm or dissection. Mediastinum/Nodes: Negative for mass or adenopathy. Small hiatal hernia. Lungs/Pleura: Negative for infiltrate effusion or mass. Lungs are clear. Upper Abdomen: Negative Musculoskeletal: Lower thoracic vertebral spurring. No fracture or bone lesion. Review of the MIP images confirms the above findings. IMPRESSION: Negative for pulmonary embolism. Mild pulmonary artery enlargement suggesting pulmonary artery hypertension. No acute abnormality in the chest. Aortic Atherosclerosis (ICD10-I70.0). Electronically Signed   By: Franchot Gallo M.D.   On: 04/09/2017 12:52   Dg Chest Portable 1 View  Result Date: 04/09/2017 CLINICAL DATA:  Shortness of breath and productive cough. EXAM: PORTABLE CHEST 1 VIEW COMPARISON:  12/11/2011 FINDINGS: The heart size and mediastinal contours are within normal limits. Slight calcification in the arch of the aorta. No effusions. Both lungs are clear. The visualized skeletal structures are unremarkable. IMPRESSION: No acute abnormality. Aortic Atherosclerosis (ICD10-I70.0). Electronically Signed   By: Lorriane Shire M.D.   On: 04/09/2017 11:06     Management plans discussed with the patient, family and they are in agreement.  CODE STATUS:     Code Status Orders  (From admission, onward)        Start     Ordered   04/10/17 1120  Full code  Continuous     04/10/17 1119    Code Status History    Date Active Date Inactive Code Status Order ID Comments User Context   04/09/2017 17:51  04/10/2017 11:20 Full Code [id number]  Fritzi Mandes, MD Inpatient      TOTAL TIME TAKING CARE OF THIS PATIENT: 40 minutes.    Fritzi Mandes M.D on 04/10/2017 at 2:31 PM  Between 7am to 6pm - Pager - [phone] After 6pm go to [email or web address] - password [email or web address] [facility]  Office  [phone]  CC: Primary care physician; Idelle Crouch, MD

## 2017-04-10 NOTE — Care Management CC44 (Signed)
Condition Code 44 Documentation Completed  Patient Details  Name: Brianna Dalton MRN: [id number] Date of Birth: Aug 26, 1945   Condition Code 44 given:  Yes Patient signature on Condition Code 44 notice:  Yes Documentation of 2 MD's agreement: Yes Code 44 added to claim:  Yes    Katrina Stack, RN 04/10/2017, 3:11 PM

## 2017-04-10 NOTE — Care Management Obs Status (Signed)
MEDICARE OBSERVATION STATUS NOTIFICATION   Patient Details  Name: Brianna Dalton MRN: [id number] Date of Birth: [birth year]   Medicare Observation Status Notification Given:  Yes    Katrina Stack, RN 04/10/2017, 3:09 PM

## 2017-04-12 ENCOUNTER — Ambulatory Visit (INDEPENDENT_AMBULATORY_CARE_PROVIDER_SITE_OTHER): Payer: Self-pay | Admitting: Vascular Surgery

## 2017-04-17 DIAGNOSIS — E7800 Pure hypercholesterolemia, unspecified: Secondary | ICD-10-CM | POA: Diagnosis not present

## 2017-04-17 DIAGNOSIS — I10 Essential (primary) hypertension: Secondary | ICD-10-CM | POA: Diagnosis not present

## 2017-04-17 DIAGNOSIS — I6523 Occlusion and stenosis of bilateral carotid arteries: Secondary | ICD-10-CM | POA: Diagnosis not present

## 2017-04-18 DIAGNOSIS — I10 Essential (primary) hypertension: Secondary | ICD-10-CM | POA: Diagnosis not present

## 2017-04-18 DIAGNOSIS — M10071 Idiopathic gout, right ankle and foot: Secondary | ICD-10-CM | POA: Diagnosis not present

## 2017-04-18 DIAGNOSIS — Z79899 Other long term (current) drug therapy: Secondary | ICD-10-CM | POA: Diagnosis not present

## 2017-04-19 ENCOUNTER — Ambulatory Visit (INDEPENDENT_AMBULATORY_CARE_PROVIDER_SITE_OTHER): Payer: Self-pay | Admitting: Vascular Surgery

## 2017-04-19 DIAGNOSIS — Z79899 Other long term (current) drug therapy: Secondary | ICD-10-CM | POA: Diagnosis not present

## 2017-04-19 DIAGNOSIS — M10071 Idiopathic gout, right ankle and foot: Secondary | ICD-10-CM | POA: Diagnosis not present

## 2017-04-19 DIAGNOSIS — I10 Essential (primary) hypertension: Secondary | ICD-10-CM | POA: Diagnosis not present

## 2017-05-01 ENCOUNTER — Other Ambulatory Visit: Payer: Self-pay | Admitting: Podiatry

## 2017-05-01 DIAGNOSIS — S92811A Other fracture of right foot, initial encounter for closed fracture: Secondary | ICD-10-CM

## 2017-05-01 DIAGNOSIS — M2580 Other specified joint disorders, unspecified joint: Secondary | ICD-10-CM | POA: Diagnosis not present

## 2017-05-01 DIAGNOSIS — M79671 Pain in right foot: Secondary | ICD-10-CM | POA: Diagnosis not present

## 2017-05-01 DIAGNOSIS — M2031 Hallux varus (acquired), right foot: Secondary | ICD-10-CM | POA: Diagnosis not present

## 2017-05-09 ENCOUNTER — Ambulatory Visit
Admission: RE | Admit: 2017-05-09 | Discharge: 2017-05-09 | Disposition: A | Discharge status: Home / Self Care | Payer: Medicare HMO | Source: Ambulatory Visit | Attending: Podiatry | Admitting: Podiatry

## 2017-05-09 DIAGNOSIS — M19071 Primary osteoarthritis, right ankle and foot: Secondary | ICD-10-CM | POA: Diagnosis not present

## 2017-05-09 DIAGNOSIS — M25474 Effusion, right foot: Secondary | ICD-10-CM | POA: Insufficient documentation

## 2017-05-09 DIAGNOSIS — S92811A Other fracture of right foot, initial encounter for closed fracture: Secondary | ICD-10-CM

## 2017-05-13 DIAGNOSIS — M2580 Other specified joint disorders, unspecified joint: Secondary | ICD-10-CM | POA: Diagnosis not present

## 2017-05-13 DIAGNOSIS — M2031 Hallux varus (acquired), right foot: Secondary | ICD-10-CM | POA: Diagnosis not present

## 2017-10-03 DIAGNOSIS — T63461A Toxic effect of venom of wasps, accidental (unintentional), initial encounter: Secondary | ICD-10-CM | POA: Diagnosis not present

## 2017-10-03 DIAGNOSIS — L03113 Cellulitis of right upper limb: Secondary | ICD-10-CM | POA: Diagnosis not present

## 2018-02-10 DIAGNOSIS — L03116 Cellulitis of left lower limb: Secondary | ICD-10-CM | POA: Diagnosis not present

## 2018-02-10 DIAGNOSIS — R600 Localized edema: Secondary | ICD-10-CM | POA: Diagnosis not present

## 2018-02-19 ENCOUNTER — Ambulatory Visit
Admission: RE | Admit: 2018-02-19 | Discharge: 2018-02-19 | Disposition: A | Discharge status: Home / Self Care | Payer: Medicare HMO | Source: Ambulatory Visit | Attending: Internal Medicine | Admitting: Internal Medicine

## 2018-02-19 ENCOUNTER — Other Ambulatory Visit: Payer: Self-pay | Admitting: Internal Medicine

## 2018-02-19 DIAGNOSIS — R600 Localized edema: Secondary | ICD-10-CM

## 2018-02-19 DIAGNOSIS — M109 Gout, unspecified: Secondary | ICD-10-CM | POA: Diagnosis not present

## 2018-05-19 DIAGNOSIS — N3000 Acute cystitis without hematuria: Secondary | ICD-10-CM | POA: Diagnosis not present

## 2018-05-19 DIAGNOSIS — Z8739 Personal history of other diseases of the musculoskeletal system and connective tissue: Secondary | ICD-10-CM | POA: Diagnosis not present

## 2018-05-19 DIAGNOSIS — I10 Essential (primary) hypertension: Secondary | ICD-10-CM | POA: Diagnosis not present

## 2018-05-19 DIAGNOSIS — E785 Hyperlipidemia, unspecified: Secondary | ICD-10-CM | POA: Diagnosis not present

## 2018-05-19 DIAGNOSIS — R0789 Other chest pain: Secondary | ICD-10-CM | POA: Diagnosis not present

## 2018-05-30 DIAGNOSIS — N179 Acute kidney failure, unspecified: Secondary | ICD-10-CM | POA: Diagnosis not present

## 2018-05-30 DIAGNOSIS — R600 Localized edema: Secondary | ICD-10-CM | POA: Diagnosis not present

## 2018-05-30 DIAGNOSIS — N390 Urinary tract infection, site not specified: Secondary | ICD-10-CM | POA: Diagnosis not present

## 2018-05-30 DIAGNOSIS — I10 Essential (primary) hypertension: Secondary | ICD-10-CM | POA: Diagnosis not present

## 2018-06-17 ENCOUNTER — Emergency Department
Admission: EM | Admit: 2018-06-17 | Discharge: 2018-06-17 | Disposition: A | Discharge status: Home / Self Care | Payer: Medicare HMO | Attending: Emergency Medicine | Admitting: Emergency Medicine

## 2018-06-17 ENCOUNTER — Emergency Department: Payer: Medicare HMO

## 2018-06-17 ENCOUNTER — Encounter: Payer: Self-pay | Admitting: Emergency Medicine

## 2018-06-17 ENCOUNTER — Other Ambulatory Visit: Payer: Self-pay

## 2018-06-17 DIAGNOSIS — I2510 Atherosclerotic heart disease of native coronary artery without angina pectoris: Secondary | ICD-10-CM | POA: Diagnosis not present

## 2018-06-17 DIAGNOSIS — I10 Essential (primary) hypertension: Secondary | ICD-10-CM | POA: Insufficient documentation

## 2018-06-17 DIAGNOSIS — R079 Chest pain, unspecified: Secondary | ICD-10-CM | POA: Insufficient documentation

## 2018-06-17 DIAGNOSIS — R609 Edema, unspecified: Secondary | ICD-10-CM

## 2018-06-17 DIAGNOSIS — R600 Localized edema: Secondary | ICD-10-CM | POA: Diagnosis not present

## 2018-06-17 DIAGNOSIS — R42 Dizziness and giddiness: Secondary | ICD-10-CM | POA: Diagnosis not present

## 2018-06-17 DIAGNOSIS — M7989 Other specified soft tissue disorders: Secondary | ICD-10-CM | POA: Diagnosis not present

## 2018-06-17 DIAGNOSIS — R0602 Shortness of breath: Secondary | ICD-10-CM | POA: Insufficient documentation

## 2018-06-17 DIAGNOSIS — R61 Generalized hyperhidrosis: Secondary | ICD-10-CM | POA: Diagnosis not present

## 2018-06-17 LAB — TROPONIN I
Troponin I: <0.03 ng/mL (ref <0.03)
Troponin I: <0.03 ng/mL (ref <0.03)

## 2018-06-17 LAB — COMPREHENSIVE METABOLIC PANEL
ALT: 13 U/L (ref 0–44)
AST: 19 U/L (ref 15–41)
Albumin: 3.8 g/dL (ref 3.5–5.0)
Alkaline Phosphatase: 83 U/L (ref 38–126)
Anion gap: 11 (ref 5–15)
BUN: 21 mg/dL (ref 8–23)
CO2: 23 mmol/L (ref 22–32)
Calcium: 8.8 mg/dL — ABNORMAL LOW (ref 8.9–10.3)
Chloride: 105 mmol/L (ref 98–111)
Creatinine, Ser: 1.14 mg/dL — ABNORMAL HIGH (ref 0.44–1.00)
GFR calc Af Amer: 56 mL/min — ABNORMAL LOW (ref >60)
GFR calc non Af Amer: 48 mL/min — ABNORMAL LOW (ref >60)
Glucose, Bld: 165 mg/dL — ABNORMAL HIGH (ref 70–99)
Potassium: 3.6 mmol/L (ref 3.5–5.1)
Sodium: 139 mmol/L (ref 135–145)
Total Bilirubin: 0.2 mg/dL — ABNORMAL LOW (ref 0.3–1.2)
Total Protein: 6.4 g/dL — ABNORMAL LOW (ref 6.5–8.1)

## 2018-06-17 LAB — URINALYSIS, COMPLETE (UACMP) WITH MICROSCOPIC
Bacteria, UA: NONE SEEN
Glucose, UA: NEGATIVE mg/dL
Hgb urine dipstick: NEGATIVE
Ketones, ur: NEGATIVE mg/dL
Leukocytes,Ua: NEGATIVE
Nitrite: NEGATIVE
Protein, ur: >=300 mg/dL — AB
Specific Gravity, Urine: 1.03 (ref 1.005–1.030)
pH: 5 (ref 5.0–8.0)

## 2018-06-17 LAB — CBC WITH DIFFERENTIAL/PLATELET
Abs Immature Granulocytes: 0.03 10*3/uL (ref 0.00–0.07)
Basophils Absolute: 0 10*3/uL (ref 0.0–0.1)
Basophils Relative: 0 %
Eosinophils Absolute: 0.1 10*3/uL (ref 0.0–0.5)
Eosinophils Relative: 2 %
HCT: 41.8 % (ref 36.0–46.0)
Hemoglobin: 13.7 g/dL (ref 12.0–15.0)
Immature Granulocytes: 0 %
Lymphocytes Relative: 15 %
Lymphs Abs: 1.3 10*3/uL (ref 0.7–4.0)
MCH: 30.2 pg (ref 26.0–34.0)
MCHC: 32.8 g/dL (ref 30.0–36.0)
MCV: 92.3 fL (ref 80.0–100.0)
Monocytes Absolute: 0.5 10*3/uL (ref 0.1–1.0)
Monocytes Relative: 6 %
Neutro Abs: 6.7 10*3/uL (ref 1.7–7.7)
Neutrophils Relative %: 77 %
Platelets: 252 10*3/uL (ref 150–400)
RBC: 4.53 MIL/uL (ref 3.87–5.11)
RDW: 13.6 % (ref 11.5–15.5)
WBC: 8.7 10*3/uL (ref 4.0–10.5)
nRBC: 0 % (ref 0.0–0.2)

## 2018-06-17 LAB — BRAIN NATRIURETIC PEPTIDE: B Natriuretic Peptide: 31 pg/mL (ref 0.0–100.0)

## 2018-06-17 LAB — FIBRIN DERIVATIVES D-DIMER (ARMC ONLY): Fibrin derivatives D-dimer (ARMC): 5396.61 ng/mL (FEU) — ABNORMAL HIGH (ref 0.00–499.00)

## 2018-06-17 MED ORDER — IOHEXOL 350 MG/ML SOLN
75.0000 mL | Freq: Once | INTRAVENOUS | Status: AC | PRN
  Administered 2018-06-17: 75 mL via INTRAVENOUS

## 2018-06-17 MED ORDER — SODIUM CHLORIDE 0.9 % IV SOLN
Freq: Once | INTRAVENOUS | Status: AC
  Administered 2018-06-17: 16:00:00 via INTRAVENOUS

## 2018-06-17 NOTE — ED Triage Notes (Signed)
C/o CP, sob, dizziness, and diaphoresis while at work today.  Patient states over past week she has had a bad gout attack where she had significant edema to bilateral lower extremities.  Patient states she has taken Torsemide and has had good diuresis yesterday and Sunday.  Patient also reports recent intermittent HTN and episodes of CP.

## 2018-06-17 NOTE — ED Notes (Signed)
Return from CT

## 2018-06-17 NOTE — ED Notes (Signed)
Pt up to restroom with steady gait. No distress noted at this time.    

## 2018-06-17 NOTE — ED Notes (Signed)
Pt educated on signs and symptoms of complications regarding the CT contrast dye infiltration by CT tech. Written instructions provided by CT department. Pt verbalized understanding.

## 2018-06-17 NOTE — ED Provider Notes (Addendum)
[facility] Emergency Department Provider Note       Time seen: ----------------------------------------- 3:39 PM on 06/17/2018 -----------------------------------------   I have reviewed the triage vital signs and the nursing notes.  HISTORY   Chief Complaint Chest Pain    HPI Brianna Dalton is a 73 y.o. female with a history of arthritis, hypertension, neuropathy who presents to the ED for chest pain, shortness of breath, dizziness and diaphoresis while at work today.  Patient states over the past week she had a gout attack where she had significant edema in her lower extremities.  She has taken torsemide and has had good diuresis yesterday on Sunday.  Also reports intermittent hypertension with episodes of chest pain.  Past Medical History:  Diagnosis Date  . Angina pectoris syndrome ([place])   . Arterial hemorrhage    Lt ankle possible venous bleed  . Arthritis   . Hypertension   . Hypothyroidism   . Neuropathy   . Varicose vein of leg     Patient Active Problem List   Diagnosis Date Noted  . Chest pain 04/09/2017  . 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 S/P total knee arthroplasty 04/27/2015  . Lumbar radiculopathy 07/07/2013  . Acid reflux 07/05/2013  . BP (high blood pressure) 07/05/2013  . HLD (hyperlipidemia) 07/05/2013  . Adult hypothyroidism 07/05/2013  . Morbid obesity ([place]) 07/05/2013  . Impaired renal function 07/05/2013    Past Surgical History:  Procedure Laterality Date  . ABDOMINAL HYSTERECTOMY    . BREAST BIOPSY Right 01/19/2013   stereo biopsy of two areas neg  . JOINT REPLACEMENT     lt knee  . KNEE ARTHROPLASTY Right 04/27/2015   Procedure: COMPUTER ASSISTED TOTAL KNEE ARTHROPLASTY;  Surgeon: Dereck Leep, MD;  Location: [facility] ORS;  Service: Orthopedics;  Laterality: Right;  . LEFT HEART CATH AND CORONARY ANGIOGRAPHY N/A 04/10/2017   Procedure: LEFT HEART CATH AND CORONARY ANGIOGRAPHY;  Surgeon: Teodoro Spray, MD;   Location: [place];  Service: Cardiovascular;  Laterality: N/A;  . SHOULDER ARTHROSCOPY Left   . THYROID SURGERY Bilateral   . TUMOR REMOVAL Right    neck    Allergies Amlodipine; Hydrocodone; Latex; Morphine and related; and Penicillins  Social History Social History   Tobacco Use  . Smoking status: Never Smoker  . Smokeless tobacco: Never Used  Substance Use Topics  . Alcohol use: No  . Drug use: No   Review of Systems Constitutional: Negative for fever. Cardiovascular: Positive for chest pain Respiratory: Positive for shortness of breath Gastrointestinal: Negative for abdominal pain, vomiting and diarrhea. Musculoskeletal: Negative for back pain. Skin: Negative for rash. Neurological: Positive for dizziness  All systems negative/normal/unremarkable except as stated in the HPI  ____________________________________________   PHYSICAL EXAM:  VITAL SIGNS: ED Triage Vitals  Enc Vitals Group     BP 06/17/18 1400 113/67     Pulse Rate 06/17/18 1400 82     Resp 06/17/18 1400 19     Temp 06/17/18 1400 (!) 97.5 F (36.4 C)     Temp Source 06/17/18 1400 Oral     SpO2 06/17/18 1400 97 %     Weight 06/17/18 1403 275 lb (124.7 kg)     Height 06/17/18 1403 5\' 10"  (1.778 m)     Head Circumference --      Peak Flow --      Pain Score 06/17/18 1403 0     Pain Loc --      Pain Edu? --  Excl. in [place]? --     Constitutional: Alert and oriented. Well appearing and in no distress. Eyes: Conjunctivae are normal. Normal extraocular movements. ENT      Head: Normocephalic and atraumatic.      Nose: No congestion/rhinnorhea.      Mouth/Throat: Mucous membranes are moist.      Neck: No stridor. Cardiovascular: Normal rate, regular rhythm. No murmurs, rubs, or gallops. Respiratory: Normal respiratory effort without tachypnea nor retractions. Breath sounds are clear and equal bilaterally. No wheezes/rales/rhonchi. Gastrointestinal: Soft and nontender. Normal bowel  sounds Musculoskeletal: Nontender with normal range of motion in extremities. No lower extremity tenderness nor edema. Neurologic:  Normal speech and language. No gross focal neurologic deficits are appreciated.  Skin:  Skin is warm, dry and intact. No rash noted. Psychiatric: Mood and affect are normal. Speech and behavior are normal.  ____________________________________________  EKG: Interpreted by me.  Sinus rhythm the rate of 85 bpm, normal PR interval, normal QRS, normal QT  ____________________________________________  ED COURSE:  As part of my medical decision making, I reviewed the following data within the [place] History obtained from family if available, nursing notes, old chart and ekg, as well as notes from prior ED visits. Patient presented for chest pain, we will assess with labs and imaging as indicated at this time.   Procedures  Brianna Dalton was evaluated in Emergency Department on 06/17/2018 for the symptoms described in the history of present illness. She was evaluated in the context of the global COVID-19 pandemic, which necessitated consideration that the patient might be at risk for infection with the SARS-CoV-2 virus that causes COVID-19. Institutional protocols and algorithms that pertain to the evaluation of patients at risk for COVID-19 are in a state of rapid change based on information released by regulatory bodies including the CDC and federal and state organizations. These policies and algorithms were followed during the patient's care in the ED.  ____________________________________________   LABS (pertinent positives/negatives)  Labs Reviewed  COMPREHENSIVE METABOLIC PANEL - Abnormal; Notable for the following components:      Result Value   Glucose, Bld 165 (*)    Creatinine, Ser 1.14 (*)    Calcium 8.8 (*)    Total Protein 6.4 (*)    Total Bilirubin 0.2 (*)    GFR calc non Af Amer 48 (*)    GFR calc Af Amer 56 (*)    All other  components within normal limits  FIBRIN DERIVATIVES D-DIMER ([facility] ONLY) - Abnormal; Notable for the following components:   Fibrin derivatives D-dimer [facility]) 5,396.61 (*)    All other components within normal limits  URINALYSIS, COMPLETE (UACMP) WITH MICROSCOPIC - Abnormal; Notable for the following components:   Color, Urine AMBER (*)    APPearance CLOUDY (*)    Bilirubin Urine SMALL (*)    Protein, ur >=300 (*)    Non Squamous Epithelial PRESENT (*)    All other components within normal limits  TROPONIN I  CBC WITH DIFFERENTIAL/PLATELET  TROPONIN I  BRAIN NATRIURETIC PEPTIDE    RADIOLOGY Chest x-ray is unremarkable IMPRESSION: 1. No filling defect is identified in the pulmonary arterial tree to suggest pulmonary embolus. Reduced sensitivity for small pulmonary emboli due to suboptimal bolus timing which was in part due to difficult IV access and IV contrast infiltration. 2. Aortic Atherosclerosis (ICD10-I70.0). Coronary atherosclerosis with mild cardiomegaly and aortic valve calcification. 3. Airway thickening is present, suggesting bronchitis or reactive airways disease. 4. Thoracic spondylosis. IMPRESSION:  No evidence of deep venous thrombosis in either lower extremity.  ____________________________________________   DIFFERENTIAL DIAGNOSIS   Musculoskeletal pain, GERD, anxiety, hypoglycemia, hypotension, unstable angina  FINAL ASSESSMENT AND PLAN  Chest pain, peripheral edema   Plan: The patient had presented for chest pain. Patient's labs were reassuring. Patient's imaging not reveal any acute process.  She had a catheterization in March of last year which was normal.  A d-dimer was ordered today which was markedly elevated of uncertain significance.  CT angiogram and ultrasound of the extremities was negative.  Low suspicion for ACS at this time after second negative troponin.  She is cleared for outpatient follow-up.   Laurence Aly, MD    Note: This  note was generated in part or whole with voice recognition software. Voice recognition is usually quite accurate but there are transcription errors that can and very often do occur. I apologize for any typographical errors that were not detected and corrected.     Earleen Newport, MD 06/17/18 1544    Earleen Newport, MD 06/17/18 [phone]

## 2018-06-17 NOTE — Progress Notes (Signed)
This patient has received 10-20 ml's of IV of Omni 350 contrast extravasation into Right AC (part of body) during a CT Angio Chest for PE exam.  The exam was performed on (date) 06/17/18 around [facility]  Site / affected area assessed by Vanetta Shawl, CT Tech and later Dr. Jimmye Norman.

## 2018-06-17 NOTE — ED Notes (Signed)
Attempted x 3 to start PIV.  Unsuccessful

## 2018-06-17 NOTE — Progress Notes (Signed)
Brianna Dalton    [id number]   Dec 30, 1945  Type of procedure: CT Angio Chest CT  06/17/2018  During your examination at [facility] some of the x-ray dye leaked into the tissues around the vein that the x-ray dye was given through.  What should you do?  1. Apply ice 20 minutes four times a day for three days.  2. Keep the affected extremity elevated above the rest of your body for 48 hours.  3. If you develop any of the following signs or symptoms, please contact Radiology at [phone]  Increased pain  Increasing swelling   Change in sensation (ex. Numbness, tingling)  Development of redness or increase in warmth around the affected area  Increasing hardness  Blistering   I have read and understand these instructions.  Any questions I raised have been discussed to my satisfaction.  I understand that failure to follow these instructions may result in additional complications.

## 2018-06-25 DIAGNOSIS — I10 Essential (primary) hypertension: Secondary | ICD-10-CM | POA: Diagnosis not present

## 2018-06-25 DIAGNOSIS — R0602 Shortness of breath: Secondary | ICD-10-CM | POA: Diagnosis not present

## 2018-06-27 DIAGNOSIS — R0602 Shortness of breath: Secondary | ICD-10-CM | POA: Diagnosis not present

## 2018-07-09 DIAGNOSIS — I10 Essential (primary) hypertension: Secondary | ICD-10-CM | POA: Diagnosis not present

## 2018-07-09 DIAGNOSIS — N289 Disorder of kidney and ureter, unspecified: Secondary | ICD-10-CM | POA: Diagnosis not present

## 2018-07-09 DIAGNOSIS — M7989 Other specified soft tissue disorders: Secondary | ICD-10-CM | POA: Diagnosis not present

## 2018-07-09 DIAGNOSIS — M79661 Pain in right lower leg: Secondary | ICD-10-CM | POA: Diagnosis not present

## 2018-07-15 ENCOUNTER — Other Ambulatory Visit: Payer: Self-pay

## 2018-07-15 ENCOUNTER — Ambulatory Visit (INDEPENDENT_AMBULATORY_CARE_PROVIDER_SITE_OTHER): Payer: Medicare HMO | Admitting: Vascular Surgery

## 2018-07-15 ENCOUNTER — Encounter (INDEPENDENT_AMBULATORY_CARE_PROVIDER_SITE_OTHER): Payer: Self-pay | Admitting: Vascular Surgery

## 2018-07-15 VITALS — BP 147/92 | HR 68 | Resp 12 | Ht 69.0 in | Wt 281.0 lb

## 2018-07-15 DIAGNOSIS — E785 Hyperlipidemia, unspecified: Secondary | ICD-10-CM

## 2018-07-15 DIAGNOSIS — R600 Localized edema: Secondary | ICD-10-CM | POA: Diagnosis not present

## 2018-07-15 DIAGNOSIS — I10 Essential (primary) hypertension: Secondary | ICD-10-CM | POA: Diagnosis not present

## 2018-07-15 DIAGNOSIS — Z79899 Other long term (current) drug therapy: Secondary | ICD-10-CM | POA: Diagnosis not present

## 2018-07-15 DIAGNOSIS — M7989 Other specified soft tissue disorders: Secondary | ICD-10-CM

## 2018-07-15 NOTE — Assessment & Plan Note (Addendum)
I have had a long discussion with the patient regarding swelling and why it  causes symptoms.  We are going to place the patient in [place] boots today because of areas of skin weeping previously and the severe swelling in the left leg.  Once her skin has healed and her swelling is under better control, hopefully we can get her into regular compression stockings.  In addition, behavioral modification will be initiated.  This will include frequent elevation, use of over the counter pain medications and exercise such as walking.  I have reviewed systemic causes for chronic edema such as liver, kidney and cardiac etiologies.  The patient denies problems with these organ systems.    Consideration for a lymph pump will also be made based upon the effectiveness of conservative therapy.  This would help to improve the edema control and prevent sequela such as ulcers and infections.  She has clearly developed some degree of lymphedema from chronic scarring and lymphatic channels, and likely has at least stage II lymphedema  Patient should undergo duplex ultrasound of the venous system to ensure that DVT or reflux is not present.  The patient will follow-up with me after the ultrasound.

## 2018-07-15 NOTE — Assessment & Plan Note (Signed)
Worsens LE swelling 

## 2018-07-15 NOTE — Assessment & Plan Note (Signed)
lipid control important in reducing the progression of atherosclerotic disease.   

## 2018-07-15 NOTE — Progress Notes (Signed)
Patient ID: Brianna Dalton, female   DOB: 22-Oct-1945, 74 y.o.   MRN: [id number]  Chief Complaint  Patient presents with   New Patient (Initial Visit)    HPI Brianna Dalton is a 73 y.o. female.  I am asked to see the patient by Dr. Doy Hutching for evaluation of leg swelling.  The patient has been struggling with worsening swelling over the past couple of months.  There is no clear inciting event or causative factor that started the swelling.  This is predominantly in the left leg, although the right leg has been involved as well now.  The swelling is gone all the way up into the thigh and groin area on the left.  She did have a negative DVT study a few weeks ago.  She has had areas of weeping skin which have been slow to heal.  She also had some bleeding behind her left ankle.  No fever chills or signs of systemic infection.  No previous history of DVT to her knowledge.     Past Medical History:  Diagnosis Date   Angina pectoris syndrome ([place])    Arterial hemorrhage    Lt ankle possible venous bleed   Arthritis    Hypertension    Hypothyroidism    Neuropathy    Varicose vein of leg     Past Surgical History:  Procedure Laterality Date   ABDOMINAL HYSTERECTOMY     BREAST BIOPSY Right 01/19/2013   stereo biopsy of two areas neg   JOINT REPLACEMENT     lt knee   KNEE ARTHROPLASTY Right 04/27/2015   Procedure: COMPUTER ASSISTED TOTAL KNEE ARTHROPLASTY;  Surgeon: Dereck Leep, MD;  Location: [facility];  Service: Orthopedics;  Laterality: Right;   LEFT HEART CATH AND CORONARY ANGIOGRAPHY N/A 04/10/2017   Procedure: LEFT HEART CATH AND CORONARY ANGIOGRAPHY;  Surgeon: Teodoro Spray, MD;  Location: [facility];  Service: Cardiovascular;  Laterality: N/A;   SHOULDER ARTHROSCOPY Left    THYROID SURGERY Bilateral    TUMOR REMOVAL Right    neck    Family History No bleeding disorders, clotting disorders, autoimmune diseases, or aneurysms  Social History Social  History   Tobacco Use   Smoking status: Never Smoker   Smokeless tobacco: Never Used  Substance Use Topics   Alcohol use: No   Drug use: No     Allergies  Allergen Reactions   Amlodipine    Hydrocodone Hives   Latex     NEGATIVE BY IgE (<0.10)   Morphine And Related Other (See Comments)    Nausea delusion    Penicillins Hives    Has patient had a PCN reaction causing immediate rash, facial/tongue/throat swelling, SOB or lightheadedness with hypotension: Yes Has patient had a PCN reaction causing severe rash involving mucus membranes or skin necrosis: No Has patient had a PCN reaction that required hospitalization: No Has patient had a PCN reaction occurring within the last 10 years: No If all of the above answers are "NO", then may proceed with Cephalosporin use.    Current Outpatient Medications  Medication Sig Dispense Refill   allopurinol (ZYLOPRIM) 300 MG tablet Take 300 mg by mouth daily.  11   aspirin EC 81 MG tablet Take 81 mg by mouth daily.     colchicine 0.6 MG tablet Take 0.6 mg by mouth daily.     levothyroxine (SYNTHROID, LEVOTHROID) 150 MCG tablet Take 150 mcg by mouth daily before breakfast.  metoprolol tartrate (LOPRESSOR) 25 MG tablet Take 12.5 mg by mouth 2 (two) times daily.     torsemide (DEMADEX) 10 MG tablet Take 10 mg by mouth daily as needed.     traMADol (ULTRAM) 50 MG tablet Take 1-2 tablets (50-100 mg total) by mouth every 4 (four) hours as needed for moderate pain. 30 tablet 0   doxycycline (VIBRA-TABS) 100 MG tablet Take 1 tablet (100 mg total) by mouth every 12 (twelve) hours. (Patient not taking: Reported on 07/15/2018) 12 tablet 0   enalapril (VASOTEC) 20 MG tablet Take 20 mg by mouth daily.     metoprolol succinate (TOPROL-XL) 50 MG 24 hr tablet Take 1 tablet (50 mg total) by mouth daily. (Patient not taking: Reported on 07/15/2018) 30 tablet 2   No current facility-administered medications for this visit.        REVIEW OF SYSTEMS (Negative unless checked)  Constitutional: [] Weight loss  [] Fever  [] Chills Cardiac: [] Chest pain   [] Chest pressure   [] Palpitations   [] Shortness of breath when laying flat   [] Shortness of breath at rest   [] Shortness of breath with exertion. Vascular:  [] Pain in legs with walking   [] Pain in legs at rest   [] Pain in legs when laying flat   [] Claudication   [] Pain in feet when walking  [] Pain in feet at rest  [] Pain in feet when laying flat   [] History of DVT   [] Phlebitis   [x] Swelling in legs   [] Varicose veins   [] Non-healing ulcers Pulmonary:   [] Uses home oxygen   [] Productive cough   [] Hemoptysis   [] Wheeze  [] COPD   [] Asthma Neurologic:  [] Dizziness  [] Blackouts   [] Seizures   [] History of stroke   [] History of TIA  [] Aphasia   [] Temporary blindness   [] Dysphagia   [] Weakness or numbness in arms   [] Weakness or numbness in legs Musculoskeletal:  [x] Arthritis   [] Joint swelling   [x] Joint pain   [] Low back pain Hematologic:  [] Easy bruising  [] Easy bleeding   [] Hypercoagulable state   [] Anemic  [] Hepatitis Gastrointestinal:  [] Blood in stool   [] Vomiting blood  [] Gastroesophageal reflux/heartburn   [] Abdominal pain Genitourinary:  [] Chronic kidney disease   [] Difficult urination  [] Frequent urination  [] Burning with urination   [] Hematuria Skin:  [] Rashes   [] Ulcers   [] Wounds Psychological:  [] History of anxiety   []  History of major depression.    Physical Exam BP (!) 147/92 (BP Location: Left Arm, Patient Position: Sitting, Cuff Size: Large)    Pulse 68    Resp 12    Ht 5\' 9"  (1.753 m)    Wt 281 lb (127.5 kg)    BMI 41.50 kg/m  Gen:  WD/WN, NAD Head: [place]/AT, No temporalis wasting.  Ear/Nose/Throat: Hearing grossly intact, nares w/o erythema or drainage, oropharynx w/o Erythema/Exudate Eyes: Conjunctiva clear, sclera non-icteric  Neck: trachea midline.  No JVD.  Pulmonary:  Good air movement, respirations not labored, no use of accessory muscles   Cardiac: RRR, no JVD Vascular:  Vessel Right Left  Radial Palpable Palpable                          DP 1+ 1+  PT 1+ NP   Gastrointestinal:. No masses, surgical incisions, or scars. Musculoskeletal: M/S 5/5 throughout.  Extremities without ischemic changes.  No deformity or atrophy.  1+ right lower extremity edema, 3+ left lower extremity edema.  In addition, there is erythema and swelling near the  left great toe likely from gouty inflammation Neurologic: Sensation grossly intact in extremities.  Symmetrical.  Speech is fluent. Motor exam as listed above. Psychiatric: Judgment intact, Mood & affect appropriate for pt's clinical situation. Dermatologic: No rashes or ulcers noted.  No cellulitis or open wounds.    Radiology Ct Angio Chest Pe W And/or Wo Contrast  Result Date: 06/17/2018 CLINICAL DATA:  Elevated D-dimer level, intermediate probability for pulmonary embolus. EXAM: CT ANGIOGRAPHY CHEST WITH CONTRAST TECHNIQUE: Multidetector CT imaging of the chest was performed using the standard protocol during bolus administration of intravenous contrast. Multiplanar CT image reconstructions and MIPs were obtained to evaluate the vascular anatomy. CONTRAST:  20mL OMNIPAQUE IOHEXOL 350 MG/ML SOLN COMPARISON:  04/09/2017 and chest radiograph from 06/17/2018 FINDINGS: The patient's IV from the emergency room was not functioning and was replaced, but the patient has difficult IV access which was somewhat tenuous. During the injection, reportedly there was a small local infiltration of contrast. My opinion, most of the 75 cc bolus went into the venous system given the visible amount of contrast. As result of the difficulty with venous axis, there suboptimal contrast distribution, which is basically in the late systemic arterial or portal venous phase. This is not ideal in assessing for pulmonary embolus, but based on the problematic IV access I was not confident that a better scan could be  obtained. Cardiovascular: No large central or segmental pulmonary embolus is identified. Sensitivity for smaller pulmonary emboli is significantly reduced due to the delayed nature of the contrast injection as described above. Mild cardiomegaly. Aortic valve calcification. Coronary, aortic arch, and branch vessel atherosclerotic vascular disease. Mediastinum/Nodes: Unremarkable Lungs/Pleura: Mild central airway thickening. Upper Abdomen: Unremarkable Musculoskeletal: Thoracic spondylosis with suspected diffuse idiopathic skeletal hyperostosis. Review of the MIP images confirms the above findings. IMPRESSION: 1. No filling defect is identified in the pulmonary arterial tree to suggest pulmonary embolus. Reduced sensitivity for small pulmonary emboli due to suboptimal bolus timing which was in part due to difficult IV access and IV contrast infiltration. 2. Aortic Atherosclerosis (ICD10-I70.0). Coronary atherosclerosis with mild cardiomegaly and aortic valve calcification. 3. Airway thickening is present, suggesting bronchitis or reactive airways disease. 4. Thoracic spondylosis. Electronically Signed   By: Van Clines M.D.   On: 06/17/2018 18:35   US Venous Img Lower Bilateral  Result Date: 06/17/2018 CLINICAL DATA:  Lower extremity swelling and elevated D-dimer EXAM: BILATERAL LOWER EXTREMITY VENOUS DOPPLER ULTRASOUND TECHNIQUE: Gray-scale sonography with graded compression, as well as color Doppler and duplex ultrasound were performed to evaluate the lower extremity deep venous systems from the level of the common femoral vein and including the common femoral, femoral, profunda femoral, popliteal and calf veins including the posterior tibial, peroneal and gastrocnemius veins when visible. The superficial great saphenous vein was also interrogated. Spectral Doppler was utilized to evaluate flow at rest and with distal augmentation maneuvers in the common femoral, femoral and popliteal veins. COMPARISON:   None. FINDINGS: RIGHT LOWER EXTREMITY Common Femoral Vein: No evidence of thrombus. Normal compressibility, respiratory phasicity and response to augmentation. Saphenofemoral Junction: No evidence of thrombus. Normal compressibility and flow on color Doppler imaging. Profunda Femoral Vein: No evidence of thrombus. Normal compressibility and flow on color Doppler imaging. Femoral Vein: No evidence of thrombus. Normal compressibility, respiratory phasicity and response to augmentation. Popliteal Vein: No evidence of thrombus. Normal compressibility, respiratory phasicity and response to augmentation. Calf Veins: No evidence of thrombus. Normal compressibility and flow on color Doppler imaging. Superficial Great Saphenous Vein: No evidence  of thrombus. Normal compressibility. Venous Reflux:  None. Other Findings:  None. LEFT LOWER EXTREMITY Common Femoral Vein: No evidence of thrombus. Normal compressibility, respiratory phasicity and response to augmentation. Saphenofemoral Junction: No evidence of thrombus. Normal compressibility and flow on color Doppler imaging. Profunda Femoral Vein: No evidence of thrombus. Normal compressibility and flow on color Doppler imaging. Femoral Vein: No evidence of thrombus. Normal compressibility, respiratory phasicity and response to augmentation. Popliteal Vein: No evidence of thrombus. Normal compressibility, respiratory phasicity and response to augmentation. Calf Veins: No evidence of thrombus. Normal compressibility and flow on color Doppler imaging. Superficial Great Saphenous Vein: No evidence of thrombus. Normal compressibility. Venous Reflux:  None. Other Findings:  None. IMPRESSION: No evidence of deep venous thrombosis in either lower extremity. Electronically Signed   By: Inez Catalina M.D.   On: 06/17/2018 19:34   Dg Chest Portable 1 View  Result Date: 06/17/2018 CLINICAL DATA:  Chest pain and shortness of breath. Dizziness. EXAM: PORTABLE CHEST 1 VIEW COMPARISON:   04/09/2017 chest radiograph and CTA chest. FINDINGS: Normal heart size. No consolidation or edema. Thoracic atherosclerosis. No acute osseous findings. IMPRESSION: No active disease.  No change from priors. Electronically Signed   By: Staci Righter M.D.   On: 06/17/2018 14:48    Labs Recent Results (from the past 2160 hour(s))  Comprehensive metabolic panel     Status: Abnormal   Collection Time: 06/17/18  2:09 PM  Result Value Ref Range   Sodium 139 135 - 145 mmol/L   Potassium 3.6 3.5 - 5.1 mmol/L   Chloride 105 98 - 111 mmol/L   CO2 23 22 - 32 mmol/L   Glucose, Bld 165 (H) 70 - 99 mg/dL   BUN 21 8 - 23 mg/dL   Creatinine, Ser 1.14 (H) 0.44 - 1.00 mg/dL   Calcium 8.8 (L) 8.9 - 10.3 mg/dL   Total Protein 6.4 (L) 6.5 - 8.1 g/dL   Albumin 3.8 3.5 - 5.0 g/dL   AST 19 15 - 41 U/L   ALT 13 0 - 44 U/L   Alkaline Phosphatase 83 38 - 126 U/L   Total Bilirubin 0.2 (L) 0.3 - 1.2 mg/dL   GFR calc non Af Amer 48 (L) >60 mL/min   GFR calc Af Amer 56 (L) >60 mL/min   Anion gap 11 5 - 15    Comment: Performed at [facility], [address] [place], [place] [address]  Troponin I - Once     Status: None   Collection Time: 06/17/18  2:09 PM  Result Value Ref Range   Troponin I <0.03 <0.03 ng/mL    Comment: Performed at [facility], [address] [place], [place] [address]  CBC with Differential     Status: None   Collection Time: 06/17/18  2:09 PM  Result Value Ref Range   WBC 8.7 4.0 - 10.5 K/uL   RBC 4.53 3.87 - 5.11 MIL/uL   Hemoglobin 13.7 12.0 - 15.0 g/dL   HCT 41.8 36.0 - 46.0 %   MCV 92.3 80.0 - 100.0 fL   MCH 30.2 26.0 - 34.0 pg   MCHC 32.8 30.0 - 36.0 g/dL   RDW 13.6 11.5 - 15.5 %   Platelets 252 150 - 400 K/uL   nRBC 0.0 0.0 - 0.2 %   Neutrophils Relative % 77 %   Neutro Abs 6.7 1.7 - 7.7 K/uL   Lymphocytes Relative 15 %   Lymphs Abs 1.3 0.7 - 4.0 K/uL  Monocytes Relative 6 %   Monocytes Absolute 0.5 0.1 - 1.0 K/uL   Eosinophils Relative 2 %    Eosinophils Absolute 0.1 0.0 - 0.5 K/uL   Basophils Relative 0 %   Basophils Absolute 0.0 0.0 - 0.1 K/uL   Immature Granulocytes 0 %   Abs Immature Granulocytes 0.03 0.00 - 0.07 K/uL    Comment: Performed at [facility] [address] [place], [place] [address]  Brain natriuretic peptide     Status: None   Collection Time: 06/17/18  2:09 PM  Result Value Ref Range   B Natriuretic Peptide 31.0 0.0 - 100.0 pg/mL    Comment: Performed at [facility], [address] [place], [place] [address]  Troponin I - ONCE - STAT     Status: None   Collection Time: 06/17/18  3:52 PM  Result Value Ref Range   Troponin I <0.03 <0.03 ng/mL    Comment: Performed at [facility], [address] [place], [place] [address]  Fibrin derivatives D-Dimer [facility] only)     Status: Abnormal   Collection Time: 06/17/18  3:52 PM  Result Value Ref Range   Fibrin derivatives D-dimer ([facility]) 5,396.61 (H) 0.00 - 499.00 ng/mL (FEU)    Comment: (NOTE) <> Exclusion of Venous Thromboembolism (VTE) - OUTPATIENT ONLY   (Emergency Department or [place])   0-499 ng/ml (FEU): With a low to intermediate pretest probability                      for VTE this test result excludes the diagnosis                      of VTE.   >499 ng/ml (FEU) : VTE not excluded; additional work up for VTE is                      required. <> Testing on Inpatients and Evaluation of Disseminated Intravascular   Coagulation (DIC) Reference Range:   0-499 ng/ml (FEU) Performed at [facility], [address] [place], [place] [address]   Urinalysis, Complete w Microscopic     Status: Abnormal   Collection Time: 06/17/18  3:52 PM  Result Value Ref Range   Color, Urine AMBER (A) YELLOW    Comment: BIOCHEMICALS MAY BE AFFECTED BY COLOR   APPearance CLOUDY (A) CLEAR   Specific Gravity, Urine 1.030 1.005 - 1.030   pH 5.0 5.0 - 8.0   Glucose, UA NEGATIVE NEGATIVE mg/dL   Hgb urine dipstick NEGATIVE NEGATIVE    Bilirubin Urine SMALL (A) NEGATIVE   Ketones, ur NEGATIVE NEGATIVE mg/dL   Protein, ur >=300 (A) NEGATIVE mg/dL   Nitrite NEGATIVE NEGATIVE   Leukocytes,Ua NEGATIVE NEGATIVE   RBC / HPF 0-5 0 - 5 RBC/hpf   WBC, UA 11-20 0 - 5 WBC/hpf   Bacteria, UA NONE SEEN NONE SEEN   Squamous Epithelial / LPF 11-20 0 - 5   Mucus PRESENT    Hyaline Casts, UA PRESENT    Non Squamous Epithelial PRESENT (A) NONE SEEN    Comment: Performed at [facility], [address] [place], [place] [address]    Assessment/Plan:  HLD (hyperlipidemia) lipid control important in reducing the progression of atherosclerotic disease.    Morbid obesity ([place]) Worsens LE swelling  BP (high blood pressure) blood pressure control important in reducing the progression of atherosclerotic disease. On appropriate oral medications.   Swelling of  limb I have had a long discussion with the patient regarding swelling and why it  causes symptoms.  We are going to place the patient in [place] boots today because of areas of skin weeping previously and the severe swelling in the left leg.  Once her skin has healed and her swelling is under better control, hopefully we can get her into regular compression stockings.  In addition, behavioral modification will be initiated.  This will include frequent elevation, use of over the counter pain medications and exercise such as walking.  I have reviewed systemic causes for chronic edema such as liver, kidney and cardiac etiologies.  The patient denies problems with these organ systems.    Consideration for a lymph pump will also be made based upon the effectiveness of conservative therapy.  This would help to improve the edema control and prevent sequela such as ulcers and infections.  She has clearly developed some degree of lymphedema from chronic scarring and lymphatic channels, and likely has at least stage II lymphedema  Patient should undergo duplex ultrasound of the  venous system to ensure that DVT or reflux is not present.  The patient will follow-up with me after the ultrasound.        Leotis Pain 07/15/2018, 4:00 PM   This note was created with Dragon medical transcription system.  Any errors from dictation are unintentional.

## 2018-07-15 NOTE — Assessment & Plan Note (Signed)
blood pressure control important in reducing the progression of atherosclerotic disease. On appropriate oral medications.  

## 2018-07-15 NOTE — Patient Instructions (Signed)

## 2018-07-22 ENCOUNTER — Encounter (INDEPENDENT_AMBULATORY_CARE_PROVIDER_SITE_OTHER): Payer: Medicare HMO

## 2018-07-23 ENCOUNTER — Ambulatory Visit (INDEPENDENT_AMBULATORY_CARE_PROVIDER_SITE_OTHER): Payer: Medicare HMO

## 2018-07-23 ENCOUNTER — Other Ambulatory Visit: Payer: Self-pay

## 2018-07-23 ENCOUNTER — Ambulatory Visit (INDEPENDENT_AMBULATORY_CARE_PROVIDER_SITE_OTHER): Payer: Medicare HMO | Admitting: Nurse Practitioner

## 2018-07-23 ENCOUNTER — Encounter (INDEPENDENT_AMBULATORY_CARE_PROVIDER_SITE_OTHER): Payer: Self-pay | Admitting: Nurse Practitioner

## 2018-07-23 VITALS — BP 186/106 | HR 71 | Resp 16 | Wt 281.0 lb

## 2018-07-23 DIAGNOSIS — E785 Hyperlipidemia, unspecified: Secondary | ICD-10-CM

## 2018-07-23 DIAGNOSIS — I872 Venous insufficiency (chronic) (peripheral): Secondary | ICD-10-CM

## 2018-07-23 DIAGNOSIS — I890 Lymphedema, not elsewhere classified: Secondary | ICD-10-CM | POA: Diagnosis not present

## 2018-07-23 DIAGNOSIS — Z79899 Other long term (current) drug therapy: Secondary | ICD-10-CM

## 2018-07-23 DIAGNOSIS — M7989 Other specified soft tissue disorders: Secondary | ICD-10-CM | POA: Diagnosis not present

## 2018-07-24 DIAGNOSIS — N289 Disorder of kidney and ureter, unspecified: Secondary | ICD-10-CM | POA: Diagnosis not present

## 2018-07-24 DIAGNOSIS — I10 Essential (primary) hypertension: Secondary | ICD-10-CM | POA: Diagnosis not present

## 2018-07-24 DIAGNOSIS — Z79899 Other long term (current) drug therapy: Secondary | ICD-10-CM | POA: Diagnosis not present

## 2018-07-24 DIAGNOSIS — Z1239 Encounter for other screening for malignant neoplasm of breast: Secondary | ICD-10-CM | POA: Diagnosis not present

## 2018-07-24 DIAGNOSIS — R7309 Other abnormal glucose: Secondary | ICD-10-CM | POA: Diagnosis not present

## 2018-07-24 DIAGNOSIS — E7800 Pure hypercholesterolemia, unspecified: Secondary | ICD-10-CM | POA: Diagnosis not present

## 2018-07-24 DIAGNOSIS — E079 Disorder of thyroid, unspecified: Secondary | ICD-10-CM | POA: Diagnosis not present

## 2018-07-24 DIAGNOSIS — Z0000 Encounter for general adult medical examination without abnormal findings: Secondary | ICD-10-CM | POA: Diagnosis not present

## 2018-07-24 DIAGNOSIS — M545 Low back pain: Secondary | ICD-10-CM | POA: Diagnosis not present

## 2018-07-29 ENCOUNTER — Encounter (INDEPENDENT_AMBULATORY_CARE_PROVIDER_SITE_OTHER): Payer: Medicare HMO

## 2018-07-30 ENCOUNTER — Encounter (INDEPENDENT_AMBULATORY_CARE_PROVIDER_SITE_OTHER): Payer: Self-pay | Admitting: Nurse Practitioner

## 2018-07-30 DIAGNOSIS — I890 Lymphedema, not elsewhere classified: Secondary | ICD-10-CM | POA: Insufficient documentation

## 2018-07-30 DIAGNOSIS — I872 Venous insufficiency (chronic) (peripheral): Secondary | ICD-10-CM | POA: Insufficient documentation

## 2018-07-30 NOTE — Progress Notes (Signed)
SUBJECTIVE:  Patient ID: Brianna Dalton, female    DOB: 04-06-1945, 73 y.o.   MRN: [id number] Chief Complaint  Patient presents with  . Follow-up    ultrasound follow up    HPI  Brianna Dalton is a 73 y.o. female Patient is seen for evaluation of leg swelling. The patient first noticed the swelling remotely but is now concerned because of a significant increase in the overall edema. The swelling is associated with pain and discoloration. The patient notes that in the morning the legs are significantly improved but they steadily worsened throughout the course of the day. Elevation makes the legs better, dependency makes them much worse.   There is no history of ulcerations associated with the swelling.   The patient denies any recent changes in their medications.  The patient has not been wearing graduated compression.  The patient has no had any past angiography, interventions or vascular surgery.  The patient denies a history of DVT or PE. There is no prior history of phlebitis. There is no history of primary lymphedema.  There is no history of radiation treatment to the groin or pelvis No history of malignancies. No history of trauma or groin or pelvic surgery. No history of foreign travel or parasitic infections area   Patient has bilateral reflux in the GSV as well as extensively within the deep venous system.  No evidence of DVT or superficial thrombophlebitis.   Past Medical History:  Diagnosis Date  . Angina pectoris syndrome ([place])   . Arterial hemorrhage    Lt ankle possible venous bleed  . Arthritis   . Hypertension   . Hypothyroidism   . Neuropathy   . Varicose vein of leg     Past Surgical History:  Procedure Laterality Date  . ABDOMINAL HYSTERECTOMY    . BREAST BIOPSY Right 01/19/2013   stereo biopsy of two areas neg  . JOINT REPLACEMENT     lt knee  . KNEE ARTHROPLASTY Right 04/27/2015   Procedure: COMPUTER ASSISTED TOTAL KNEE ARTHROPLASTY;  Surgeon:  Dereck Leep, MD;  Location: [facility];  Service: Orthopedics;  Laterality: Right;  . LEFT HEART CATH AND CORONARY ANGIOGRAPHY N/A 04/10/2017   Procedure: LEFT HEART CATH AND CORONARY ANGIOGRAPHY;  Surgeon: Teodoro Spray, MD;  Location: [facility];  Service: Cardiovascular;  Laterality: N/A;  . SHOULDER ARTHROSCOPY Left   . THYROID SURGERY Bilateral   . TUMOR REMOVAL Right    neck    Social History   Socioeconomic History  . Marital status: Divorced    Spouse name: Not on file  . Number of children: Not on file  . Years of education: Not on file  . Highest education level: Not on file  Occupational History  . Not on file  Social Needs  . Financial resource strain: Not on file  . Food insecurity    Worry: Not on file    Inability: Not on file  . Transportation needs    Medical: Not on file    Non-medical: Not on file  Tobacco Use  . Smoking status: Never Smoker  . Smokeless tobacco: Never Used  Substance and Sexual Activity  . Alcohol use: No  . Drug use: No  . Sexual activity: Never  Lifestyle  . Physical activity    Days per week: Not on file    Minutes per session: Not on file  . Stress: Not on file  Relationships  . Social connections  Talks on phone: Not on file    Gets together: Not on file    Attends religious service: Not on file    Active member of club or organization: Not on file    Attends meetings of clubs or organizations: Not on file    Relationship status: Not on file  . Intimate partner violence    Fear of current or ex partner: Not on file    Emotionally abused: Not on file    Physically abused: Not on file    Forced sexual activity: Not on file  Other Topics Concern  . Not on file  Social History Narrative  . Not on file    History reviewed. No pertinent family history.  Allergies  Allergen Reactions  . Amlodipine   . Hydrocodone Hives  . Latex     NEGATIVE BY IgE (<0.10)  . Morphine And Related Other (See Comments)     Nausea delusion   . Penicillins Hives    Has patient had a PCN reaction causing immediate rash, facial/tongue/throat swelling, SOB or lightheadedness with hypotension: Yes Has patient had a PCN reaction causing severe rash involving mucus membranes or skin necrosis: No Has patient had a PCN reaction that required hospitalization: No Has patient had a PCN reaction occurring within the last 10 years: No If all of the above answers are "NO", then may proceed with Cephalosporin use.     Review of Systems   Review of Systems: Negative Unless Checked Constitutional: [] Weight loss  [] Fever  [] Chills Cardiac: [] Chest pain   []  Atrial Fibrillation  [] Palpitations   [] Shortness of breath when laying flat   [] Shortness of breath with exertion. [] Shortness of breath at rest Vascular:  [] Pain in legs with walking   [x] Pain in legs with standing [] Pain in legs when laying flat   [] Claudication    [] Pain in feet when laying flat    [] History of DVT   [] Phlebitis   [x] Swelling in legs   [x] Varicose veins   [] Non-healing ulcers Pulmonary:   [] Uses home oxygen   [] Productive cough   [] Hemoptysis   [] Wheeze  [] COPD   [] Asthma Neurologic:  [] Dizziness   [] Seizures  [] Blackouts [] History of stroke   [] History of TIA  [] Aphasia   [] Temporary Blindness   [] Weakness or numbness in arm   [] Weakness or numbness in leg Musculoskeletal:   [] Joint swelling   [] Joint pain   [] Low back pain  []  History of Knee Replacement [x] Arthritis [] back Surgeries  []  Spinal Stenosis    Hematologic:  [] Easy bruising  [] Easy bleeding   [] Hypercoagulable state   [] Anemic Gastrointestinal:  [] Diarrhea   [] Vomiting  [] Gastroesophageal reflux/heartburn   [] Difficulty swallowing. [] Abdominal pain Genitourinary:  [] Chronic kidney disease   [] Difficult urination  [] Anuric   [] Blood in urine [] Frequent urination  [] Burning with urination   [] Hematuria Skin:  [] Rashes   [] Ulcers [] Wounds Psychological:  [] History of anxiety   []  History of  major depression  []  Memory Difficulties      OBJECTIVE:   Physical Exam  BP (!) 186/106 (BP Location: Right Arm)   Pulse 71   Resp 16   Wt 281 lb (127.5 kg)   BMI 41.50 kg/m   Gen: WD/WN, NAD Head: [place]/AT, No temporalis wasting.  Ear/Nose/Throat: Hearing grossly intact, nares w/o erythema or drainage Eyes: PER, EOMI, sclera nonicteric.  Neck: Supple, no masses.  No JVD.  Pulmonary:  Good air movement, no use of accessory muscles.  Cardiac: RRR Vascular: 3+ edema bilaterally,  scattered varicose veins bilaterally 3-63mm Vessel Right Left  Radial Palpable Palpable  Dorsalis Pedis Palpable Palpable  Posterior Tibial Palpable Palpable   Gastrointestinal: soft, non-distended. No guarding/no peritoneal signs.  Musculoskeletal: M/S 5/5 throughout.  No deformity or atrophy.  Neurologic: Pain and light touch intact in extremities.  Symmetrical.  Speech is fluent. Motor exam as listed above. Psychiatric: Judgment intact, Mood & affect appropriate for pt's clinical situation. Dermatologic: Stasis dermatitis bilaterally.  No changes consistent with cellulitis. Lymph : No Cervical lymphadenopathy, no lichenification , dermal thickening bilaterally        ASSESSMENT AND PLAN:  1. Lymphedema Recommend:  No surgery or intervention at this point in time.    I have reviewed my previous discussion with the patient regarding swelling and why it causes symptoms.  Patient will continue wearing graduated compression stockings class 1 (20-30 mmHg) on a daily basis. The patient will  beginning wearing the stockings first thing in the morning and removing them in the evening. The patient is instructed specifically not to sleep in the stockings.    In addition, behavioral modification including several periods of elevation of the lower extremities during the day will be continued.  This was reviewed with the patient during the initial visit.  The patient will also continue routine exercise,  especially walking on a daily basis as was discussed during the initial visit.    Despite conservative treatments including graduated compression therapy class 1 and behavioral modification including exercise and elevation the patient  has not obtained adequate control of the lymphedema.  The patient still has stage 3 lymphedema and therefore, I believe that a lymph pump should be added to improve the control of the patient's lymphedema.  Additionally, a lymph pump is warranted because it will reduce the risk of cellulitis and ulceration in the future.  Patient should follow-up in three months    2. Chronic venous insufficiency  Recommend:  The patient has large symptomatic varicose veins that are painful and associated with swelling.  I have had a long discussion with the patient regarding  varicose veins and why they cause symptoms.  Patient will begin wearing graduated compression stockings class 1 on a daily basis, beginning first thing in the morning and removing them in the evening. The patient is instructed specifically not to sleep in the stockings.    The patient  will also begin using over-the-counter analgesics such as Motrin 600 mg po TID to help control the symptoms.    In addition, behavioral modification including elevation during the day will be initiated.     Further plans will be based on the ultrasound results and whether conservative therapies are successful at eliminating the pain and swelling.   Patient has mixed lymphedema issues, we will see if lymph pump therapy is adequate, if not we will discuss laser possibilities.   3. Hyperlipidemia, unspecified hyperlipidemia type Continue statin as ordered and reviewed, no changes at this time    Current Outpatient Medications on File Prior to Visit  Medication Sig Dispense Refill  . allopurinol (ZYLOPRIM) 300 MG tablet Take 300 mg by mouth daily.  11  . aspirin EC 81 MG tablet Take 81 mg by mouth daily.    . colchicine  0.6 MG tablet Take 0.6 mg by mouth daily.    . enalapril (VASOTEC) 20 MG tablet Take 20 mg by mouth daily.    Marland Kitchen levothyroxine (SYNTHROID, LEVOTHROID) 150 MCG tablet Take 150 mcg by mouth daily before breakfast.    .  metoprolol tartrate (LOPRESSOR) 25 MG tablet Take 12.5 mg by mouth 2 (two) times daily.    Marland Kitchen torsemide (DEMADEX) 10 MG tablet Take 10 mg by mouth daily as needed.    . traMADol (ULTRAM) 50 MG tablet Take 1-2 tablets (50-100 mg total) by mouth every 4 (four) hours as needed for moderate pain. 30 tablet 0  . doxycycline (VIBRA-TABS) 100 MG tablet Take 1 tablet (100 mg total) by mouth every 12 (twelve) hours. (Patient not taking: Reported on 07/15/2018) 12 tablet 0  . metoprolol succinate (TOPROL-XL) 50 MG 24 hr tablet Take 1 tablet (50 mg total) by mouth daily. (Patient not taking: Reported on 07/15/2018) 30 tablet 2   No current facility-administered medications on file prior to visit.     There are no Patient Instructions on file for this visit. Return in about 3 months (around 10/23/2018).   Kris Hartmann, NP  This note was completed with Sales executive.  Any errors are purely unintentional.

## 2018-08-05 ENCOUNTER — Ambulatory Visit (INDEPENDENT_AMBULATORY_CARE_PROVIDER_SITE_OTHER): Payer: Medicare HMO | Admitting: Nurse Practitioner

## 2018-08-06 DIAGNOSIS — I890 Lymphedema, not elsewhere classified: Secondary | ICD-10-CM | POA: Diagnosis not present

## 2018-09-02 ENCOUNTER — Telehealth (INDEPENDENT_AMBULATORY_CARE_PROVIDER_SITE_OTHER): Payer: Self-pay | Admitting: Nurse Practitioner

## 2018-09-02 NOTE — Telephone Encounter (Signed)
That is fine, we can bring her in to be seen

## 2018-09-03 ENCOUNTER — Encounter (INDEPENDENT_AMBULATORY_CARE_PROVIDER_SITE_OTHER): Payer: Self-pay | Admitting: Nurse Practitioner

## 2018-09-03 ENCOUNTER — Ambulatory Visit (INDEPENDENT_AMBULATORY_CARE_PROVIDER_SITE_OTHER): Payer: Medicare HMO | Admitting: Nurse Practitioner

## 2018-09-03 ENCOUNTER — Other Ambulatory Visit: Payer: Self-pay

## 2018-09-03 VITALS — BP 149/89 | HR 62 | Resp 12 | Ht 69.0 in | Wt 276.0 lb

## 2018-09-03 DIAGNOSIS — I890 Lymphedema, not elsewhere classified: Secondary | ICD-10-CM

## 2018-09-03 DIAGNOSIS — I10 Essential (primary) hypertension: Secondary | ICD-10-CM | POA: Diagnosis not present

## 2018-09-03 DIAGNOSIS — E785 Hyperlipidemia, unspecified: Secondary | ICD-10-CM | POA: Diagnosis not present

## 2018-09-03 DIAGNOSIS — A09 Infectious gastroenteritis and colitis, unspecified: Secondary | ICD-10-CM | POA: Insufficient documentation

## 2018-09-08 NOTE — Progress Notes (Signed)
SUBJECTIVE:  Patient ID: Brianna Dalton, female    DOB: 12/06/1945, 73 y.o.   MRN: [id number] Chief Complaint  Patient presents with  . Follow-up    HPI  Brianna Dalton is a 73 y.o. female that presents today with a red, swollen and weeping left lower extremity.  Patient states that this is been progressive over the last few weeks despite wearing medical grade 1 compression stockings and elevating.  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  She states that she has been wearing her medical grade 1 compression stockings however lately she is unable to get them on.  She denies any fever, chills, nausea, vomiting or diarrhea.  The patient has some weeping present.  Past Medical History:  Diagnosis Date  . Angina pectoris syndrome ([place])   . Arterial hemorrhage    Lt ankle possible venous bleed  . Arthritis   . Hypertension   . Hypothyroidism   . Neuropathy   . Varicose vein of leg     Past Surgical History:  Procedure Laterality Date  . ABDOMINAL HYSTERECTOMY    . BREAST BIOPSY Right 01/19/2013   stereo biopsy of two areas neg  . JOINT REPLACEMENT     lt knee  . KNEE ARTHROPLASTY Right 04/27/2015   Procedure: COMPUTER ASSISTED TOTAL KNEE ARTHROPLASTY;  Surgeon: Dereck Leep, MD;  Location: [facility];  Service: Orthopedics;  Laterality: Right;  . LEFT HEART CATH AND CORONARY ANGIOGRAPHY N/A 04/10/2017   Procedure: LEFT HEART CATH AND CORONARY ANGIOGRAPHY;  Surgeon: Teodoro Spray, MD;  Location: [facility];  Service: Cardiovascular;  Laterality: N/A;  . SHOULDER ARTHROSCOPY Left   . THYROID SURGERY Bilateral   . TUMOR REMOVAL Right    neck    Social History   Socioeconomic History  . Marital status: Divorced    Spouse name: Not on file  . Number of children: Not on file  . Years of education: Not on file  . Highest education level: Not on file  Occupational History  . Not on file  Social Needs  . Financial resource strain: Not  on file  . Food insecurity    Worry: Not on file    Inability: Not on file  . Transportation needs    Medical: Not on file    Non-medical: Not on file  Tobacco Use  . Smoking status: Never Smoker  . Smokeless tobacco: Never Used  Substance and Sexual Activity  . Alcohol use: No  . Drug use: No  . Sexual activity: Never  Lifestyle  . Physical activity    Days per week: Not on file    Minutes per session: Not on file  . Stress: Not on file  Relationships  . Social Herbalist on phone: Not on file    Gets together: Not on file    Attends religious service: Not on file    Active member of club or organization: Not on file    Attends meetings of clubs or organizations: Not on file    Relationship status: Not on file  . Intimate partner violence    Fear of current or ex partner: Not on file    Emotionally abused: Not on file    Physically abused: Not on file    Forced sexual activity: Not on file  Other Topics Concern  . Not on file  Social History Narrative  . Not on file  History reviewed. No pertinent family history.  Allergies  Allergen Reactions  . Amlodipine   . Hydrocodone Hives  . Latex     NEGATIVE BY IgE (<0.10)  . Morphine And Related Other (See Comments)    Nausea delusion   . Penicillins Hives    Has patient had a PCN reaction causing immediate rash, facial/tongue/throat swelling, SOB or lightheadedness with hypotension: Yes Has patient had a PCN reaction causing severe rash involving mucus membranes or skin necrosis: No Has patient had a PCN reaction that required hospitalization: No Has patient had a PCN reaction occurring within the last 10 years: No If all of the above answers are "NO", then may proceed with Cephalosporin use.     Review of Systems   Review of Systems: Negative Unless Checked Constitutional: [] Weight loss  [] Fever  [] Chills Cardiac: [] Chest pain   []  Atrial Fibrillation  [] Palpitations   [] Shortness of breath when  laying flat   [] Shortness of breath with exertion. [] Shortness of breath at rest Vascular:  [] Pain in legs with walking   [] Pain in legs with standing [] Pain in legs when laying flat   [] Claudication    [] Pain in feet when laying flat    [] History of DVT   [] Phlebitis   [] Swelling in legs   [] Varicose veins   [] Non-healing ulcers Pulmonary:   [] Uses home oxygen   [] Productive cough   [] Hemoptysis   [] Wheeze  [] COPD   [] Asthma Neurologic:  [] Dizziness   [] Seizures  [] Blackouts [] History of stroke   [] History of TIA  [] Aphasia   [] Temporary Blindness   [] Weakness or numbness in arm   [] Weakness or numbness in leg Musculoskeletal:   [] Joint swelling   [] Joint pain   [] Low back pain  []  History of Knee Replacement [] Arthritis [] back Surgeries  []  Spinal Stenosis    Hematologic:  [] Easy bruising  [] Easy bleeding   [] Hypercoagulable state   [] Anemic Gastrointestinal:  [] Diarrhea   [] Vomiting  [] Gastroesophageal reflux/heartburn   [] Difficulty swallowing. [] Abdominal pain Genitourinary:  [] Chronic kidney disease   [] Difficult urination  [] Anuric   [] Blood in urine [] Frequent urination  [] Burning with urination   [] Hematuria Skin:  [] Rashes   [] Ulcers [] Wounds Psychological:  [] History of anxiety   []  History of major depression  []  Memory Difficulties      OBJECTIVE:   Physical Exam  BP (!) 149/89 (BP Location: Left Arm, Patient Position: Sitting, Cuff Size: Large)   Pulse 62   Resp 12   Ht 5\' 9"  (1.753 m)   Wt 276 lb (125.2 kg)   BMI 40.76 kg/m   Gen: WD/WN, NAD Head: [place]/AT, No temporalis wasting.  Ear/Nose/Throat: Hearing grossly intact, nares w/o erythema or drainage Eyes: PER, EOMI, sclera nonicteric.  Neck: Supple, no masses.  No JVD.  Pulmonary:  Good air movement, no use of accessory muscles.  Cardiac: RRR Vascular: 3+ edema on right with weeping present Vessel Right Left  Radial Palpable Palpable  Dorsalis Pedis Palpable Palpable  Posterior Tibial Palpable Palpable    Gastrointestinal: soft, non-distended. No guarding/no peritoneal signs.  Musculoskeletal: M/S 5/5 throughout.  No deformity or atrophy.  Neurologic: Pain and light touch intact in extremities.  Symmetrical.  Speech is fluent. Motor exam as listed above. Psychiatric: Judgment intact, Mood & affect appropriate for pt's clinical situation. Dermatologic: No Venous rashes. No Ulcers Noted.  No changes consistent with cellulitis. Lymph : No Cervical lymphadenopathy, no lichenification or skin changes of chronic lymphedema.       ASSESSMENT AND  PLAN:  1. Lymphedema No surgery or intervention at this point in time.    I have had a long discussion with the patient regarding venous insufficiency and why it  causes symptoms, specifically venous ulceration . I have discussed with the patient the chronic skin changes that accompany venous insufficiency and the long term sequela such as infection and recurring  ulceration.  Patient will be placed in [employer] which will be changed weekly drainage permitting.  In addition, behavioral modification including several periods of elevation of the lower extremities during the day will be continued. Achieving a position with the ankles at heart level was stressed to the patient  The patient is instructed to begin routine exercise, especially walking on a daily basis  The patient will continue using her lymphedema pump as well.  We will have the patient return to the office in 4 weeks.   2. Essential hypertension Continue antihypertensive medications as already ordered, these medications have been reviewed and there are no changes at this time.  3. Hyperlipidemia, unspecified hyperlipidemia type Continue statin as ordered and reviewed, no changes at this time    Current Outpatient Medications on File Prior to Visit  Medication Sig Dispense Refill  . allopurinol (ZYLOPRIM) 300 MG tablet Take 300 mg by mouth daily.  11  . aspirin EC 81 MG tablet Take 81  mg by mouth daily.    . colchicine 0.6 MG tablet Take 0.6 mg by mouth daily.    Marland Kitchen doxycycline (VIBRA-TABS) 100 MG tablet Take 1 tablet (100 mg total) by mouth every 12 (twelve) hours. (Patient not taking: Reported on 07/15/2018) 12 tablet 0  . enalapril (VASOTEC) 20 MG tablet Take 20 mg by mouth daily.    Marland Kitchen levothyroxine (SYNTHROID, LEVOTHROID) 150 MCG tablet Take 150 mcg by mouth daily before breakfast.    . metoprolol succinate (TOPROL-XL) 50 MG 24 hr tablet Take 1 tablet (50 mg total) by mouth daily. (Patient not taking: Reported on 07/15/2018) 30 tablet 2  . metoprolol tartrate (LOPRESSOR) 25 MG tablet Take 12.5 mg by mouth 2 (two) times daily.    Marland Kitchen torsemide (DEMADEX) 10 MG tablet Take 10 mg by mouth daily as needed.    . traMADol (ULTRAM) 50 MG tablet Take 1-2 tablets (50-100 mg total) by mouth every 4 (four) hours as needed for moderate pain. 30 tablet 0   No current facility-administered medications on file prior to visit.     There are no Patient Instructions on file for this visit. No follow-ups on file.   Kris Hartmann, NP  This note was completed with Sales executive.  Any errors are purely unintentional.

## 2018-09-10 ENCOUNTER — Encounter (INDEPENDENT_AMBULATORY_CARE_PROVIDER_SITE_OTHER): Payer: Self-pay

## 2018-09-10 ENCOUNTER — Telehealth (INDEPENDENT_AMBULATORY_CARE_PROVIDER_SITE_OTHER): Payer: Self-pay | Admitting: Nurse Practitioner

## 2018-09-10 ENCOUNTER — Other Ambulatory Visit: Payer: Self-pay

## 2018-09-10 ENCOUNTER — Encounter (INDEPENDENT_AMBULATORY_CARE_PROVIDER_SITE_OTHER): Payer: Medicare HMO

## 2018-09-10 ENCOUNTER — Other Ambulatory Visit (INDEPENDENT_AMBULATORY_CARE_PROVIDER_SITE_OTHER): Payer: Self-pay | Admitting: Nurse Practitioner

## 2018-09-10 NOTE — Telephone Encounter (Signed)
Patient is at [facility] and requesting a RX for compression stockings using ICD-10 code I83.009 bc her compressions will be covered by insurance. Per Arna Medici this can not be done because patient does not have any unspecified ulcers on her legs.   Patient is requesting rx be sent to [facility] using ICD-10 that we use for her. RX filled out and faxed to [phone].

## 2018-09-17 ENCOUNTER — Encounter (INDEPENDENT_AMBULATORY_CARE_PROVIDER_SITE_OTHER): Payer: Medicare HMO

## 2018-09-24 ENCOUNTER — Ambulatory Visit (INDEPENDENT_AMBULATORY_CARE_PROVIDER_SITE_OTHER): Payer: Medicare HMO | Admitting: Nurse Practitioner

## 2018-10-02 DIAGNOSIS — Z79899 Other long term (current) drug therapy: Secondary | ICD-10-CM | POA: Diagnosis not present

## 2018-10-02 DIAGNOSIS — E079 Disorder of thyroid, unspecified: Secondary | ICD-10-CM | POA: Diagnosis not present

## 2018-10-02 DIAGNOSIS — Z6841 Body Mass Index (BMI) 40.0 and over, adult: Secondary | ICD-10-CM | POA: Diagnosis not present

## 2018-10-02 DIAGNOSIS — M79661 Pain in right lower leg: Secondary | ICD-10-CM | POA: Diagnosis not present

## 2018-10-02 DIAGNOSIS — M7989 Other specified soft tissue disorders: Secondary | ICD-10-CM | POA: Diagnosis not present

## 2018-10-02 DIAGNOSIS — I10 Essential (primary) hypertension: Secondary | ICD-10-CM | POA: Diagnosis not present

## 2018-10-21 DIAGNOSIS — I10 Essential (primary) hypertension: Secondary | ICD-10-CM | POA: Diagnosis not present

## 2018-10-23 ENCOUNTER — Ambulatory Visit (INDEPENDENT_AMBULATORY_CARE_PROVIDER_SITE_OTHER): Payer: Medicare HMO | Admitting: Nurse Practitioner

## 2018-10-28 ENCOUNTER — Ambulatory Visit (INDEPENDENT_AMBULATORY_CARE_PROVIDER_SITE_OTHER): Payer: Medicare HMO | Admitting: Nurse Practitioner

## 2018-10-28 ENCOUNTER — Encounter (INDEPENDENT_AMBULATORY_CARE_PROVIDER_SITE_OTHER): Payer: Self-pay | Admitting: Nurse Practitioner

## 2018-10-28 ENCOUNTER — Other Ambulatory Visit: Payer: Self-pay

## 2018-10-28 VITALS — BP 177/112 | HR 58 | Resp 12 | Ht 69.0 in | Wt 277.0 lb

## 2018-10-28 DIAGNOSIS — I10 Essential (primary) hypertension: Secondary | ICD-10-CM | POA: Diagnosis not present

## 2018-10-28 DIAGNOSIS — I83813 Varicose veins of bilateral lower extremities with pain: Secondary | ICD-10-CM | POA: Diagnosis not present

## 2018-10-28 DIAGNOSIS — M1990 Unspecified osteoarthritis, unspecified site: Secondary | ICD-10-CM | POA: Diagnosis not present

## 2018-10-28 DIAGNOSIS — R0789 Other chest pain: Secondary | ICD-10-CM | POA: Insufficient documentation

## 2018-10-31 ENCOUNTER — Encounter (INDEPENDENT_AMBULATORY_CARE_PROVIDER_SITE_OTHER): Payer: Self-pay | Admitting: Nurse Practitioner

## 2018-10-31 DIAGNOSIS — I83813 Varicose veins of bilateral lower extremities with pain: Secondary | ICD-10-CM | POA: Insufficient documentation

## 2018-10-31 NOTE — Progress Notes (Signed)
SUBJECTIVE:  Patient ID: Brianna Dalton, female    DOB: [birth year], 73 y.o.   MRN: [id number] Chief Complaint  Patient presents with  . Follow-up    BLE edma with shortness of breath and fatigue    HPI  Brianna Dalton is a 73 y.o. female The patient returns for followup evaluation 3 months after the initial visit. The patient continues to have pain in the lower extremities with dependency. The pain is lessened with elevation. Graduated compression stockings, Class I (20-30 mmHg), have been worn but the stockings do not eliminate the leg pain. Over-the-counter analgesics do not improve the symptoms. The degree of discomfort continues to interfere with daily activities. The patient notes the pain in the legs is causing problems with daily exercise, at the workplace and even with household activities and maintenance such as standing in the kitchen preparing meals and doing dishes.   Venous ultrasound shows reflux within the common femoral vein, saphenofemoral junction of the right lower extremity and in the femoral vein and popliteal vein of the left lower extremity, no evidence of acute or chronic DVT.  Superficial reflux is present in the bilateral great saphenous veins.    Past Medical History:  Diagnosis Date  . Angina pectoris syndrome ([place])   . Arterial hemorrhage    Lt ankle possible venous bleed  . Arthritis   . Hypertension   . Hypothyroidism   . Neuropathy   . Varicose vein of leg     Past Surgical History:  Procedure Laterality Date  . ABDOMINAL HYSTERECTOMY    . BREAST BIOPSY Right 01/19/2013   stereo biopsy of two areas neg  . JOINT REPLACEMENT     lt knee  . KNEE ARTHROPLASTY Right 04/27/2015   Procedure: COMPUTER ASSISTED TOTAL KNEE ARTHROPLASTY;  Surgeon: Dereck Leep, MD;  Location: [facility];  Service: Orthopedics;  Laterality: Right;  . LEFT HEART CATH AND CORONARY ANGIOGRAPHY N/A 04/10/2017   Procedure: LEFT HEART CATH AND CORONARY ANGIOGRAPHY;  Surgeon: Teodoro Spray, MD;  Location: [facility];  Service: Cardiovascular;  Laterality: N/A;  . SHOULDER ARTHROSCOPY Left   . THYROID SURGERY Bilateral   . TUMOR REMOVAL Right    neck    Social History   Socioeconomic History  . Marital status: Divorced    Spouse name: Not on file  . Number of children: Not on file  . Years of education: Not on file  . Highest education level: Not on file  Occupational History  . Not on file  Social Needs  . Financial resource strain: Not on file  . Food insecurity    Worry: Not on file    Inability: Not on file  . Transportation needs    Medical: Not on file    Non-medical: Not on file  Tobacco Use  . Smoking status: Never Smoker  . Smokeless tobacco: Never Used  Substance and Sexual Activity  . Alcohol use: No  . Drug use: No  . Sexual activity: Never  Lifestyle  . Physical activity    Days per week: Not on file    Minutes per session: Not on file  . Stress: Not on file  Relationships  . Social Herbalist on phone: Not on file    Gets together: Not on file    Attends religious service: Not on file    Active member of club or organization: Not on file    Attends meetings of clubs  or organizations: Not on file    Relationship status: Not on file  . Intimate partner violence    Fear of current or ex partner: Not on file    Emotionally abused: Not on file    Physically abused: Not on file    Forced sexual activity: Not on file  Other Topics Concern  . Not on file  Social History Narrative  . Not on file    History reviewed. No pertinent family history.  Allergies  Allergen Reactions  . Oxycodone-Acetaminophen Hives, Rash, Shortness Of Breath and Swelling  . Aloe Vera Hives  . Amlodipine   . Hydrocodone Hives  . Latex     NEGATIVE BY IgE (<0.10)  . Morphine And Related Other (See Comments)    Nausea delusion   . Penicillins Hives    Has patient had a PCN reaction causing immediate rash, facial/tongue/throat  swelling, SOB or lightheadedness with hypotension: Yes Has patient had a PCN reaction causing severe rash involving mucus membranes or skin necrosis: No Has patient had a PCN reaction that required hospitalization: No Has patient had a PCN reaction occurring within the last 10 years: No If all of the above answers are "NO", then may proceed with Cephalosporin use.  . Meperidine Rash     Review of Systems   Review of Systems: Negative Unless Checked Constitutional: [] Weight loss  [] Fever  [] Chills Cardiac: [] Chest pain   []  Atrial Fibrillation  [] Palpitations   [] Shortness of breath when laying flat   [] Shortness of breath with exertion. [] Shortness of breath at rest Vascular:  [x] Pain in legs with walking   [x] Pain in legs with standing [] Pain in legs when laying flat   [] Claudication    [] Pain in feet when laying flat    [] History of DVT   [] Phlebitis   [x] Swelling in legs   [x] Varicose veins   [] Non-healing ulcers Pulmonary:   [] Uses home oxygen   [] Productive cough   [] Hemoptysis   [] Wheeze  [] COPD   [] Asthma Neurologic:  [] Dizziness   [] Seizures  [] Blackouts [] History of stroke   [] History of TIA  [] Aphasia   [] Temporary Blindness   [] Weakness or numbness in arm   [] Weakness or numbness in leg Musculoskeletal:   [] Joint swelling   [] Joint pain   [] Low back pain  []  History of Knee Replacement [x] Arthritis [] back Surgeries  []  Spinal Stenosis    Hematologic:  [] Easy bruising  [] Easy bleeding   [] Hypercoagulable state   [] Anemic Gastrointestinal:  [] Diarrhea   [] Vomiting  [x] Gastroesophageal reflux/heartburn   [] Difficulty swallowing. [] Abdominal pain Genitourinary:  [] Chronic kidney disease   [] Difficult urination  [] Anuric   [] Blood in urine [] Frequent urination  [] Burning with urination   [] Hematuria Skin:  [] Rashes   [] Ulcers [] Wounds Psychological:  [] History of anxiety   []  History of major depression  []  Memory Difficulties      OBJECTIVE:   Physical Exam  BP (!) 177/112 (BP  Location: Left Arm, Patient Position: Sitting, Cuff Size: Normal)   Pulse (!) 58   Resp 12   Ht 5\' 9"  (1.753 m)   Wt 277 lb (125.6 kg)   BMI 40.91 kg/m   Gen: WD/WN, NAD Head: [place]/AT, No temporalis wasting.  Ear/Nose/Throat: Hearing grossly intact, nares w/o erythema or drainage Eyes: PER, EOMI, sclera nonicteric.  Neck: Supple, no masses.  No JVD.  Pulmonary:  Good air movement, no use of accessory muscles.  Cardiac: RRR Vascular: Large varicosities present extensively greater than 10 mm bilaterally.  Mild venous  stasis changes to the legs bilaterally.  2+ soft pitting edema Vessel Right Left  Radial Palpable Palpable  Dorsalis Pedis Palpable Palpable  Posterior Tibial Palpable Palpable   Gastrointestinal: soft, non-distended. No guarding/no peritoneal signs.  Musculoskeletal: M/S 5/5 throughout.  No deformity or atrophy.  Neurologic: Pain and light touch intact in extremities.  Symmetrical.  Speech is fluent. Motor exam as listed above. Psychiatric: Judgment intact, Mood & affect appropriate for pt's clinical situation. Dermatologic:  No Ulcers Noted.  No changes consistent with cellulitis. Lymph : No Cervical lymphadenopathy, dermal thickening       ASSESSMENT AND PLAN:  1. Varicose veins of lower extremity with pain, bilateral Recommend  I have reviewed my previous  discussion with the patient regarding  varicose veins and why they cause symptoms. Patient will continue  wearing graduated compression stockings class 1 on a daily basis, beginning first thing in the morning and removing them in the evening.    In addition, behavioral modification including elevation during the day was again discussed and this will continue.  The patient has utilized over the counter pain medications and has been exercising.  However, at this time conservative therapy has not alleviated the patient's symptoms of leg pain and swelling  Recommend: laser ablation of the left great saphenous  veins to eliminate the symptoms of pain and swelling of the lower extremities caused by the severe superficial venous reflux disease.   2. Arthritis Continue NSAID medications as already ordered, these medications have been reviewed and there are no changes at this time.  Continued activity and therapy was stressed.   3. Essential hypertension Continue antihypertensive medications as already ordered, these medications have been reviewed and there are no changes at this time.    Current Outpatient Medications on File Prior to Visit  Medication Sig Dispense Refill  . allopurinol (ZYLOPRIM) 300 MG tablet Take 300 mg by mouth daily.  11  . aspirin EC 81 MG tablet Take 81 mg by mouth daily.    . colchicine 0.6 MG tablet Take 0.6 mg by mouth daily.    . enalapril (VASOTEC) 20 MG tablet Take 20 mg by mouth daily.    . hydrochlorothiazide (HYDRODIURIL) 25 MG tablet Take 25 mg by mouth daily.    Marland Kitchen levothyroxine (SYNTHROID, LEVOTHROID) 150 MCG tablet Take 150 mcg by mouth daily before breakfast.    . metoprolol tartrate (LOPRESSOR) 25 MG tablet Take 12.5 mg by mouth 2 (two) times daily.    Marland Kitchen torsemide (DEMADEX) 10 MG tablet Take 10 mg by mouth daily as needed.    . traMADol (ULTRAM) 50 MG tablet Take 1-2 tablets (50-100 mg total) by mouth every 4 (four) hours as needed for moderate pain. 30 tablet 0   No current facility-administered medications on file prior to visit.     There are no Patient Instructions on file for this visit. No follow-ups on file.   Kris Hartmann, NP  This note was completed with Sales executive.  Any errors are purely unintentional.

## 2018-12-01 ENCOUNTER — Telehealth (INDEPENDENT_AMBULATORY_CARE_PROVIDER_SITE_OTHER): Payer: Self-pay

## 2018-12-01 DIAGNOSIS — Z20828 Contact with and (suspected) exposure to other viral communicable diseases: Secondary | ICD-10-CM | POA: Diagnosis not present

## 2018-12-01 DIAGNOSIS — U071 COVID-19: Secondary | ICD-10-CM | POA: Diagnosis not present

## 2018-12-01 NOTE — Telephone Encounter (Signed)
Patient is schedule for laser on 12/05/2018 and was calling to inform that her friend/teammate had tested positive for COVID-19. She went to go get tested today at [facility] and her results were asymptomatic COVID positive. The patient is dehydrated and was advise to take tylenol  and drink plenty fluids. The patient laser will be schedule on another day.

## 2018-12-05 ENCOUNTER — Other Ambulatory Visit (INDEPENDENT_AMBULATORY_CARE_PROVIDER_SITE_OTHER): Payer: Medicare HMO | Admitting: Vascular Surgery

## 2018-12-08 ENCOUNTER — Encounter (INDEPENDENT_AMBULATORY_CARE_PROVIDER_SITE_OTHER): Payer: Medicare HMO

## 2018-12-10 DIAGNOSIS — Z20828 Contact with and (suspected) exposure to other viral communicable diseases: Secondary | ICD-10-CM | POA: Diagnosis not present

## 2018-12-10 DIAGNOSIS — U071 COVID-19: Secondary | ICD-10-CM | POA: Diagnosis not present

## 2018-12-17 DIAGNOSIS — E079 Disorder of thyroid, unspecified: Secondary | ICD-10-CM | POA: Diagnosis not present

## 2018-12-17 DIAGNOSIS — U071 COVID-19: Secondary | ICD-10-CM | POA: Diagnosis not present

## 2018-12-17 DIAGNOSIS — R0602 Shortness of breath: Secondary | ICD-10-CM | POA: Diagnosis not present

## 2018-12-17 DIAGNOSIS — I10 Essential (primary) hypertension: Secondary | ICD-10-CM | POA: Diagnosis not present

## 2018-12-17 DIAGNOSIS — Z79899 Other long term (current) drug therapy: Secondary | ICD-10-CM | POA: Diagnosis not present

## 2018-12-17 DIAGNOSIS — J208 Acute bronchitis due to other specified organisms: Secondary | ICD-10-CM | POA: Diagnosis not present

## 2019-01-06 DIAGNOSIS — R600 Localized edema: Secondary | ICD-10-CM | POA: Diagnosis not present

## 2019-01-06 DIAGNOSIS — J984 Other disorders of lung: Secondary | ICD-10-CM | POA: Diagnosis not present

## 2019-01-06 DIAGNOSIS — Z6841 Body Mass Index (BMI) 40.0 and over, adult: Secondary | ICD-10-CM | POA: Diagnosis not present

## 2019-01-06 DIAGNOSIS — R519 Headache, unspecified: Secondary | ICD-10-CM | POA: Diagnosis not present

## 2019-01-06 DIAGNOSIS — R9389 Abnormal findings on diagnostic imaging of other specified body structures: Secondary | ICD-10-CM | POA: Diagnosis not present

## 2019-01-06 DIAGNOSIS — U071 COVID-19: Secondary | ICD-10-CM | POA: Diagnosis not present

## 2019-01-06 DIAGNOSIS — M542 Cervicalgia: Secondary | ICD-10-CM | POA: Diagnosis not present

## 2019-01-06 DIAGNOSIS — R55 Syncope and collapse: Secondary | ICD-10-CM | POA: Diagnosis not present

## 2019-01-07 ENCOUNTER — Other Ambulatory Visit: Payer: Self-pay | Admitting: Internal Medicine

## 2019-01-07 DIAGNOSIS — R519 Headache, unspecified: Secondary | ICD-10-CM

## 2019-01-20 DIAGNOSIS — R55 Syncope and collapse: Secondary | ICD-10-CM | POA: Diagnosis not present

## 2019-01-20 DIAGNOSIS — I6523 Occlusion and stenosis of bilateral carotid arteries: Secondary | ICD-10-CM | POA: Diagnosis not present

## 2019-01-21 ENCOUNTER — Other Ambulatory Visit: Payer: Self-pay

## 2019-01-21 ENCOUNTER — Ambulatory Visit
Admission: RE | Admit: 2019-01-21 | Discharge: 2019-01-21 | Disposition: A | Discharge status: Home / Self Care | Payer: Medicare HMO | Source: Ambulatory Visit | Attending: Internal Medicine | Admitting: Internal Medicine

## 2019-01-21 DIAGNOSIS — R42 Dizziness and giddiness: Secondary | ICD-10-CM | POA: Diagnosis not present

## 2019-01-21 DIAGNOSIS — R519 Headache, unspecified: Secondary | ICD-10-CM | POA: Insufficient documentation

## 2019-02-08 IMAGING — US US EXTREM LOW VENOUS*R*
1 series · 13 of 24 positions shown · non-contrast
Comparison: None.

CLINICAL DATA: Right lower extremity pain and edema for 2 months.
History of prior right knee arthroplasty.



[Series 1: us extrem low venous*right* · 0.08mm/px · 13 of 36 slices shown]
[im 1/36]
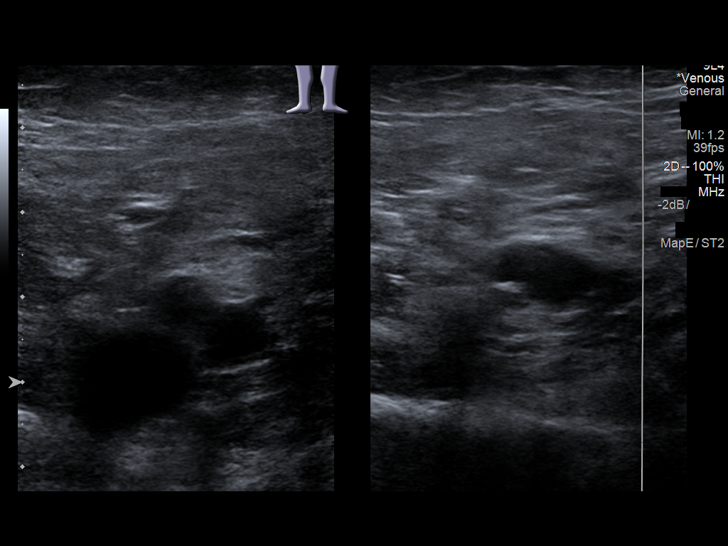
[im 4/36]
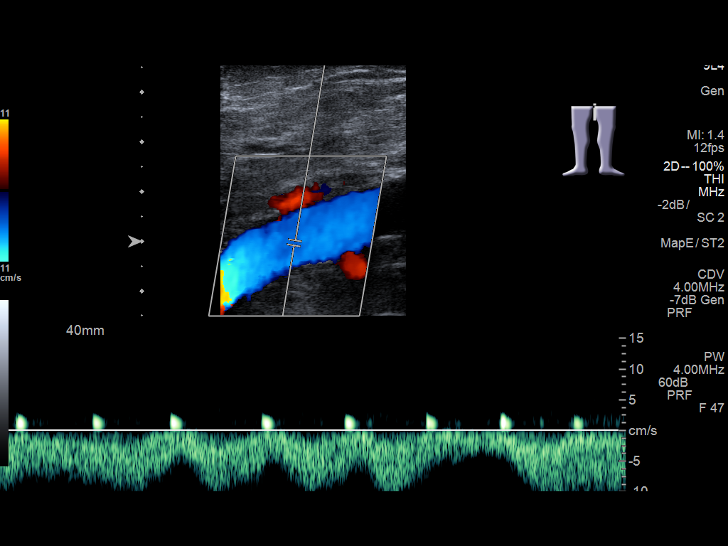
[im 7/36]
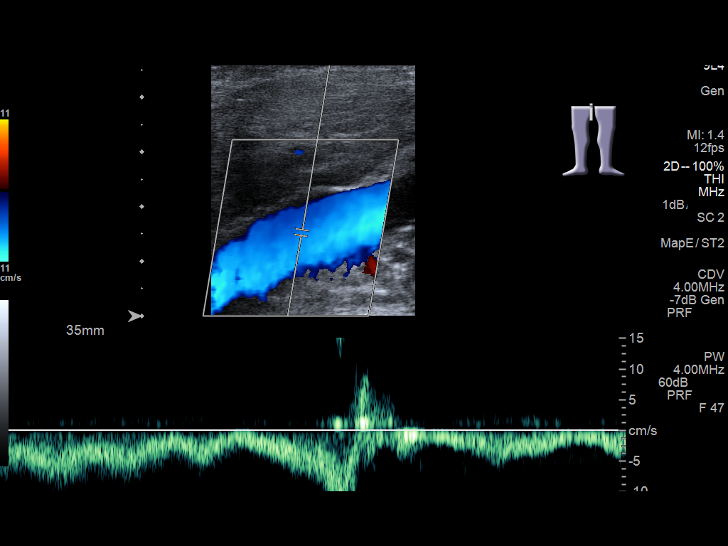
[im 10/36]
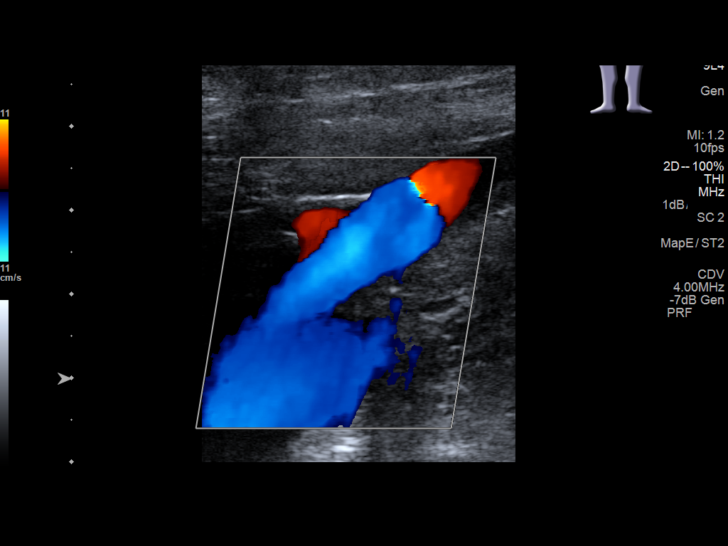
[im 13/36]
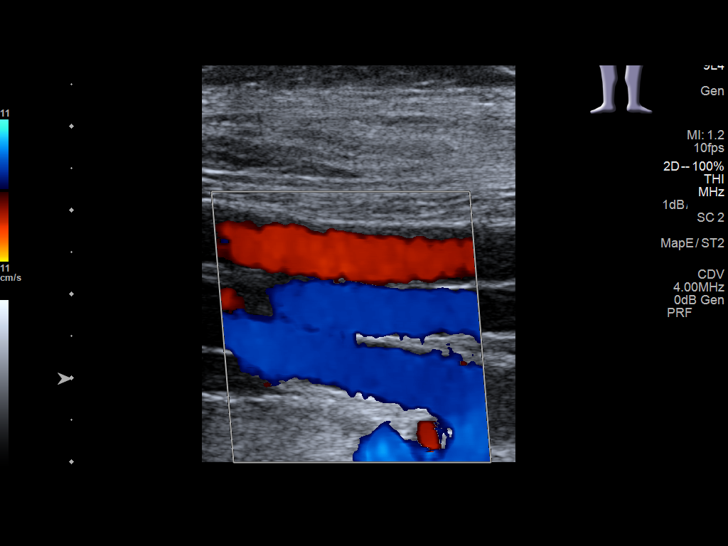
[im 16/36]
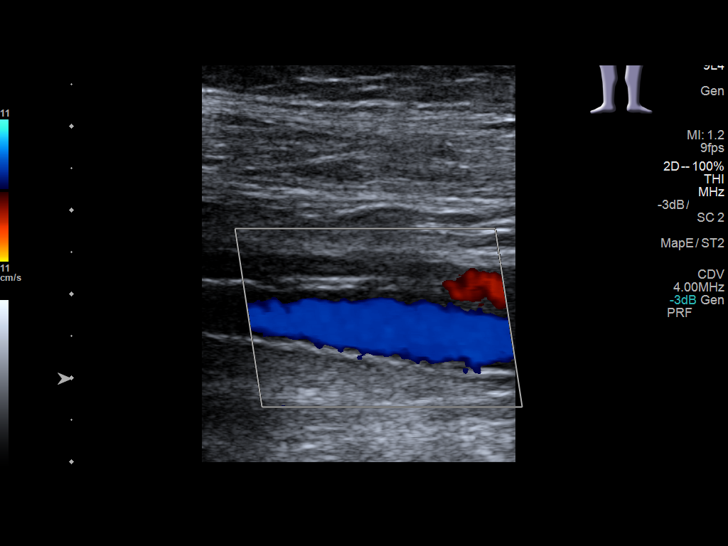
[im 19/36]
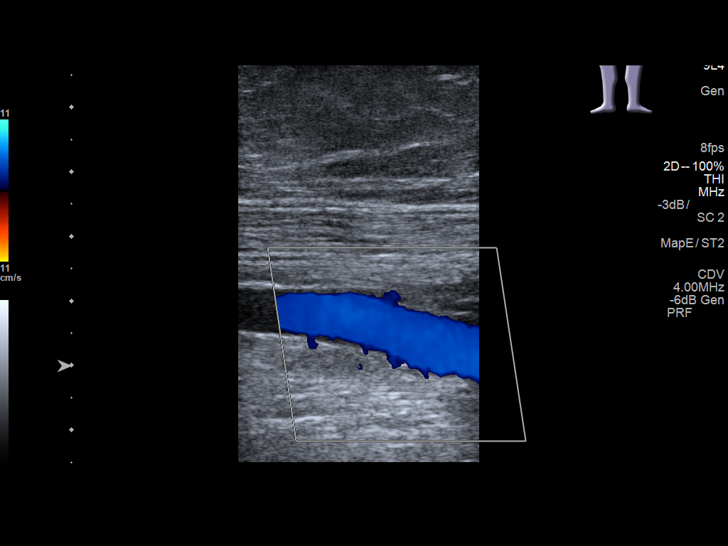
[im 20/36]
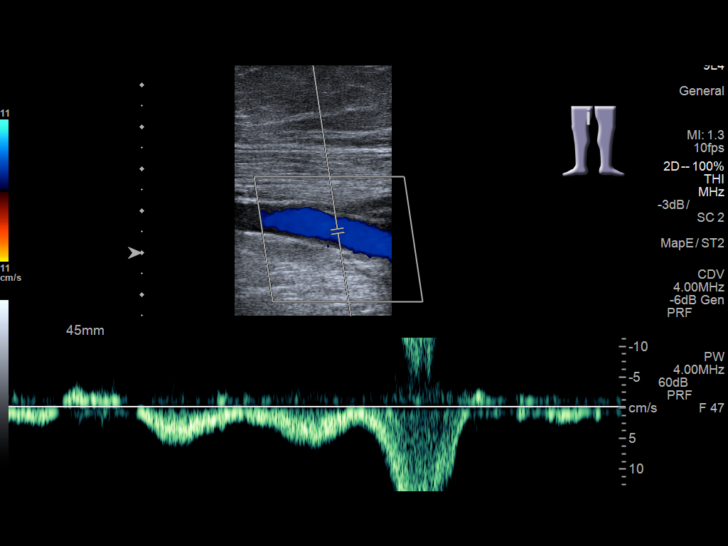
[im 23/36]
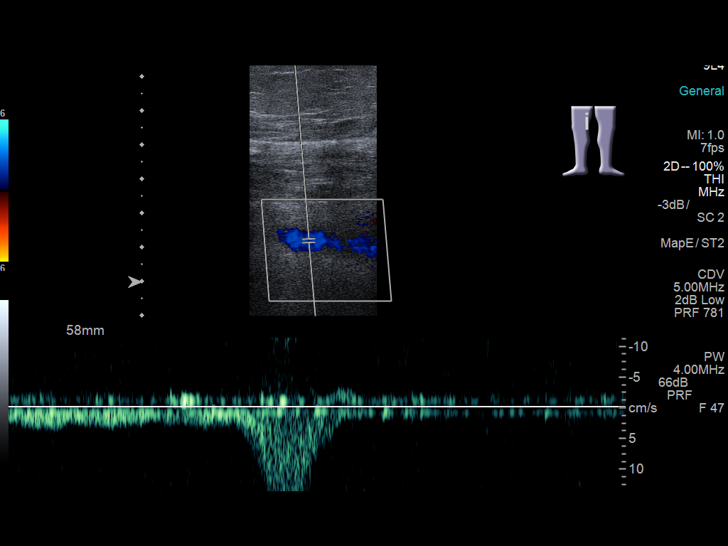
[im 26/36]
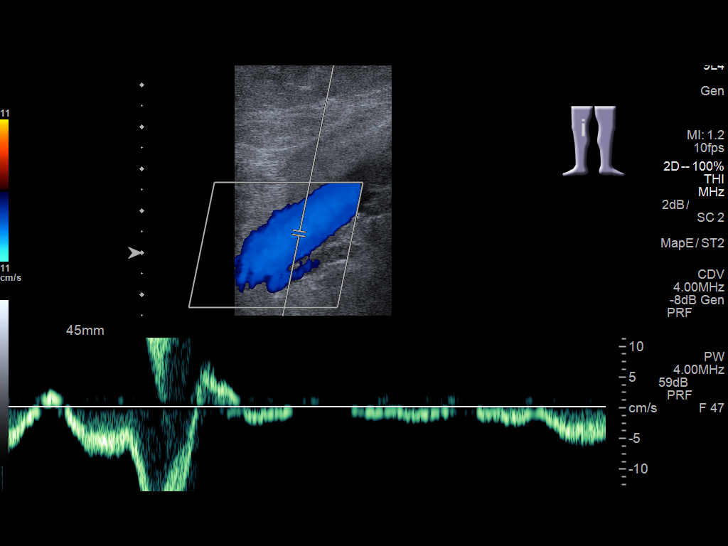
[im 29/36]
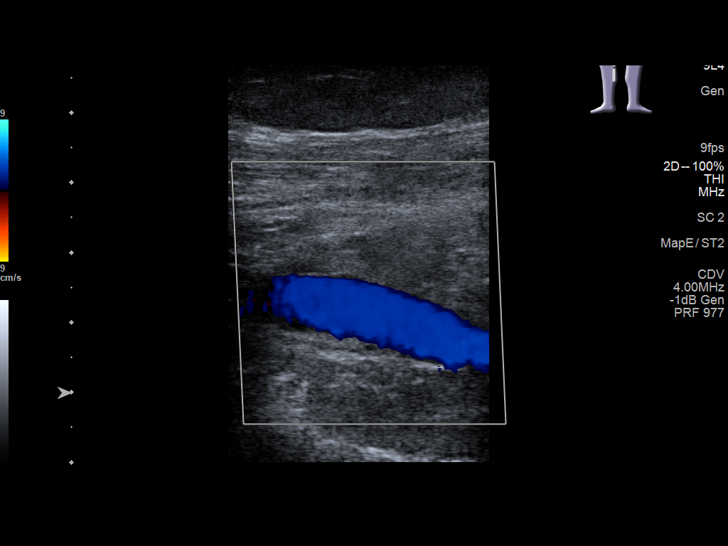
[im 32/36]
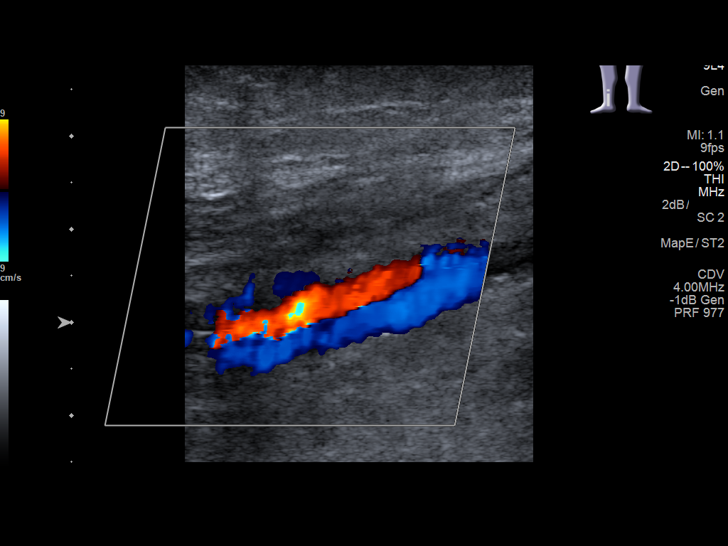
[im 36/36]
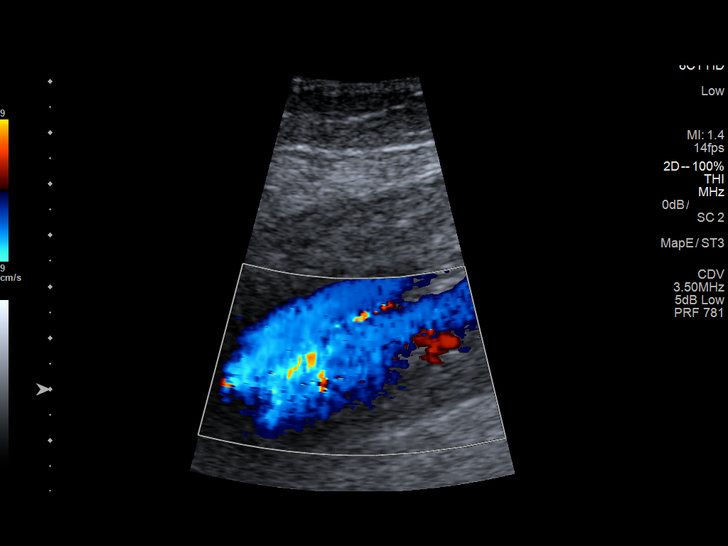

[13 of 24 positions shown; findings below may reference images not displayed]

FINDINGS: Contralateral Common Femoral Vein: Respiratory phasicity is normal
and symmetric with the symptomatic side. No evidence of thrombus.
Normal compressibility.

Common Femoral Vein: No evidence of thrombus. Normal
compressibility, respiratory phasicity and response to augmentation.

Saphenofemoral Junction: No evidence of thrombus. Normal
compressibility and flow on color Doppler imaging.

Profunda Femoral Vein: No evidence of thrombus. Normal
compressibility and flow on color Doppler imaging.

Femoral Vein: No evidence of thrombus. Normal compressibility,
respiratory phasicity and response to augmentation.

Popliteal Vein: No evidence of thrombus. Normal compressibility,
respiratory phasicity and response to augmentation.

Calf Veins: No evidence of thrombus. Normal compressibility and flow
on color Doppler imaging.

Superficial Great Saphenous Vein: No evidence of thrombus. Normal
compressibility.

Venous Reflux:  None.

Other Findings: No evidence of superficial thrombophlebitis or
abnormal fluid collection.
IMPRESSION: No evidence of right lower extremity deep venous thrombosis.

## 2019-02-10 DIAGNOSIS — H10213 Acute toxic conjunctivitis, bilateral: Secondary | ICD-10-CM | POA: Diagnosis not present

## 2019-02-17 DIAGNOSIS — I10 Essential (primary) hypertension: Secondary | ICD-10-CM | POA: Diagnosis not present

## 2019-02-17 DIAGNOSIS — E7800 Pure hypercholesterolemia, unspecified: Secondary | ICD-10-CM | POA: Diagnosis not present

## 2019-02-17 DIAGNOSIS — E079 Disorder of thyroid, unspecified: Secondary | ICD-10-CM | POA: Diagnosis not present

## 2019-02-17 DIAGNOSIS — Z79899 Other long term (current) drug therapy: Secondary | ICD-10-CM | POA: Diagnosis not present

## 2019-02-17 DIAGNOSIS — Z6841 Body Mass Index (BMI) 40.0 and over, adult: Secondary | ICD-10-CM | POA: Diagnosis not present

## 2019-02-24 ENCOUNTER — Ambulatory Visit (INDEPENDENT_AMBULATORY_CARE_PROVIDER_SITE_OTHER): Payer: Medicare HMO | Admitting: Vascular Surgery

## 2019-02-24 ENCOUNTER — Encounter (INDEPENDENT_AMBULATORY_CARE_PROVIDER_SITE_OTHER): Payer: Self-pay | Admitting: Vascular Surgery

## 2019-02-24 ENCOUNTER — Other Ambulatory Visit: Payer: Self-pay

## 2019-02-24 ENCOUNTER — Encounter (INDEPENDENT_AMBULATORY_CARE_PROVIDER_SITE_OTHER): Payer: Self-pay

## 2019-02-24 VITALS — BP 172/96 | HR 53 | Resp 16 | Wt 277.0 lb

## 2019-02-24 DIAGNOSIS — I83813 Varicose veins of bilateral lower extremities with pain: Secondary | ICD-10-CM

## 2019-02-24 DIAGNOSIS — I10 Essential (primary) hypertension: Secondary | ICD-10-CM

## 2019-02-24 DIAGNOSIS — L97321 Non-pressure chronic ulcer of left ankle limited to breakdown of skin: Secondary | ICD-10-CM | POA: Diagnosis not present

## 2019-02-24 DIAGNOSIS — E785 Hyperlipidemia, unspecified: Secondary | ICD-10-CM | POA: Diagnosis not present

## 2019-02-24 NOTE — Progress Notes (Signed)
MRN : [id number]  Brianna Dalton is a 74 y.o. (10-01-1945) female who presents with chief complaint of  Chief Complaint  Patient presents with  . Follow-up    ref [facility] for leg swelling  .  History of Present Illness: Patient returns today in follow up of left leg swelling now with weeping and some skin breakdown.  Her symptoms are predominantly in the left leg.  She was scheduled for a left leg laser ablation towards the end of last year but had to postpone secondary to Covid.  She has recovered from Covid and is not having any current Covid related symptoms.  She denies any chest pain or shortness of breath.  The leg is very painful.  Besides the swelling and skin weeping there is also more discoloration.  It is somewhat red and purplish particularly at the end of days.  Current Outpatient Medications  Medication Sig Dispense Refill  . allopurinol (ZYLOPRIM) 300 MG tablet Take 300 mg by mouth daily.  11  . aspirin EC 81 MG tablet Take 81 mg by mouth daily.    . colchicine 0.6 MG tablet Take 0.6 mg by mouth as needed.     . hydrochlorothiazide (HYDRODIURIL) 25 MG tablet Take 25 mg by mouth daily.    . Levothyroxine Sodium 25 MCG CAPS Take 25 mcg by mouth daily before breakfast.     . metoprolol tartrate (LOPRESSOR) 25 MG tablet Take 12.5 mg by mouth 2 (two) times daily.    . traMADol (ULTRAM) 50 MG tablet Take 1-2 tablets (50-100 mg total) by mouth every 4 (four) hours as needed for moderate pain. 30 tablet 0  . enalapril (VASOTEC) 20 MG tablet Take 20 mg by mouth daily.    Marland Kitchen torsemide (DEMADEX) 10 MG tablet Take 10 mg by mouth daily as needed.     No current facility-administered medications for this visit.    Past Medical History:  Diagnosis Date  . Angina pectoris syndrome ([place])   . Arterial hemorrhage    Lt ankle possible venous bleed  . Arthritis   . Hypertension   . Hypothyroidism   . Neuropathy   . Varicose vein of leg     Past Surgical History:  Procedure  Laterality Date  . ABDOMINAL HYSTERECTOMY    . BREAST BIOPSY Right 01/19/2013   stereo biopsy of two areas neg  . JOINT REPLACEMENT     lt knee  . KNEE ARTHROPLASTY Right 04/27/2015   Procedure: COMPUTER ASSISTED TOTAL KNEE ARTHROPLASTY;  Surgeon: Dereck Leep, MD;  Location: [facility];  Service: Orthopedics;  Laterality: Right;  . LEFT HEART CATH AND CORONARY ANGIOGRAPHY N/A 04/10/2017   Procedure: LEFT HEART CATH AND CORONARY ANGIOGRAPHY;  Surgeon: Teodoro Spray, MD;  Location: [facility];  Service: Cardiovascular;  Laterality: N/A;  . SHOULDER ARTHROSCOPY Left   . THYROID SURGERY Bilateral   . TUMOR REMOVAL Right    neck    Family History No bleeding disorders, clotting disorders, autoimmune diseases, or aneurysms  Social History Social History       Tobacco Use  . Smoking status: Never Smoker  . Smokeless tobacco: Never Used  Substance Use Topics  . Alcohol use: No  . Drug use: No   REVIEW OF SYSTEMS (Negative unless checked)  Constitutional: [] ?Weight loss  [] ?Fever  [] ?Chills Cardiac: [] ?Chest pain   [] ?Chest pressure   [] ?Palpitations   [] ?Shortness of breath when laying flat   [] ?Shortness of breath at rest   [] ?  Shortness of breath with exertion. Vascular:  [] ?Pain in legs with walking   [] ?Pain in legs at rest   [] ?Pain in legs when laying flat   [] ?Claudication   [] ?Pain in feet when walking  [] ?Pain in feet at rest  [] ?Pain in feet when laying flat   [] ?History of DVT   [] ?Phlebitis   [x] ?Swelling in legs   [] ?Varicose veins   [] ?Non-healing ulcers Pulmonary:   [] ?Uses home oxygen   [] ?Productive cough   [] ?Hemoptysis   [] ?Wheeze  [] ?COPD   [] ?Asthma Neurologic:  [] ?Dizziness  [] ?Blackouts   [] ?Seizures   [] ?History of stroke   [] ?History of TIA  [] ?Aphasia   [] ?Temporary blindness   [] ?Dysphagia   [] ?Weakness or numbness in arms   [] ?Weakness or numbness in legs Musculoskeletal:  [x] ?Arthritis   [] ?Joint swelling   [x] ?Joint pain   [] ?Low back  pain Hematologic:  [] ?Easy bruising  [] ?Easy bleeding   [] ?Hypercoagulable state   [] ?Anemic  [] ?Hepatitis Gastrointestinal:  [] ?Blood in stool   [] ?Vomiting blood  [] ?Gastroesophageal reflux/heartburn   [] ?Abdominal pain Genitourinary:  [] ?Chronic kidney disease   [] ?Difficult urination  [] ?Frequent urination  [] ?Burning with urination   [] ?Hematuria Skin:  [] ?Rashes   [] ?Ulcers   [] ?Wounds Psychological:  [] ?History of anxiety   [] ? History of major depression.  Physical Examination  BP (!) 172/96 (BP Location: Right Arm)   Pulse (!) 53   Resp 16   Wt 277 lb (125.6 kg)   BMI 40.91 kg/m  Gen:  WD/WN, NAD Head: [place]/AT, No temporalis wasting. Ear/Nose/Throat: Hearing grossly intact, nares w/o erythema or drainage Eyes: Conjunctiva clear. Sclera non-icteric Neck: Supple.  Trachea midline Pulmonary:  Good air movement, no use of accessory muscles.  Cardiac: RRR, no JVD Vascular:  Vessel Right Left  Radial Palpable Palpable                          PT  1+ palpable  not palpable  DP Palpable  1+ palpable   Gastrointestinal: soft, non-tender/non-distended. No guarding/reflex.  Musculoskeletal: M/S 5/5 throughout.  No deformity or atrophy.  Prominent varicosities on both legs more so on the left.  Trace right lower extremity edema, 2+ left lower extremity edema. Neurologic: Sensation grossly intact in extremities.  Symmetrical.  Speech is fluent.  Psychiatric: Judgment intact, Mood & affect appropriate for pt's clinical situation. Dermatologic: Shallow scabbing and ulceration on the medial and lateral ankle areas worse on the lateral side.       Labs No results found for this or any previous visit (from the past 2160 hour(s)).  Radiology No results found.  Assessment/Plan HLD (hyperlipidemia) lipid control important in reducing the progression of atherosclerotic disease.    Morbid obesity ([place]) Worsens LE swelling  BP (high blood pressure) blood pressure control  important in reducing the progression of atherosclerotic disease. On appropriate oral medications.  Lower limb ulcer, ankle, left, limited to breakdown of skin (HCC) A 3 layer Unna boot was placed today and we changed weekly.  We need to go ahead and try to get her venous intervention done in the next couple of weeks as well.  Varicose veins of lower extremity with pain, bilateral We delayed her left great saphenous vein laser ablation from the end of last year because of Covid.  She is now having worsening left leg symptoms I would like to go ahead and get the left leg laser ablation done.  Were going to shoot for next week if  possible.  Were going to put her in an Unna boot today and change this weekly.    Leotis Pain, MD  02/24/2019 11:31 AM    This note was created with Dragon medical transcription system.  Any errors from dictation are purely unintentional

## 2019-02-24 NOTE — Assessment & Plan Note (Signed)
We delayed her left great saphenous vein laser ablation from the end of last year because of Covid.  She is now having worsening left leg symptoms I would like to go ahead and get the left leg laser ablation done.  Were going to shoot for next week if possible.  Were going to put her in an Unna boot today and change this weekly.

## 2019-02-24 NOTE — Assessment & Plan Note (Signed)
A 3 layer Unna boot was placed today and we changed weekly.  We need to go ahead and try to get her venous intervention done in the next couple of weeks as well.

## 2019-02-24 NOTE — Patient Instructions (Signed)
Nonsurgical Procedures for Varicose Veins Various nonsurgical procedures can be used to treat varicose veins. Varicose veins are swollen, twisted veins that are visible under the skin. They occur most often in the legs. These veins may appear blue and bulging. Varicose veins are caused by damage to the valves in veins. All veins have a valve that makes blood flow in only one direction. If a valve gets weak or damaged, blood can pool and cause varicose veins. You may need a procedure to treat your varicose veins if they are causing symptoms or complications, or if lifestyle changes have not helped. These procedures can reduce pain, aching, and the risk of bleeding and blood clots. They can also improve the way the affected area looks (cosmetic appearance). The three common nonsurgical procedures are:  Sclerotherapy. A chemical is injected to close off a vein.  Laser treatment. Light energy is applied to close off the vein.  Radiofrequency vein ablation. Electrical energy is used to produce heat that closes off the vein. Your health care provider will discuss the method that is best for you based on your condition. Tell a health care provider about:  Any allergies you have.  All medicines you are taking, including vitamins, herbs, eye drops, creams, and over-the-counter medicines.  Any problems you or family members have had with anesthetic medicines.  Any blood disorders you have.  Any surgeries you have had.  Any medical conditions you have.  Whether you are pregnant or may be pregnant. What are the risks? Generally, this is a safe procedure. However, problems may occur, including:  Damage to nearby nerves, tissues, or veins.  Skin irritation, sores, or dark spots.  Numbness.  Clotting.  Infection.  Allergic reactions to medicines.  Scarring.  Leg swelling.  Need for additional treatments.  Bruising. What happens before the procedure?  Ask your health care provider  about: ? Changing or stopping your regular medicines. This is especially important if you are taking diabetes medicines or blood thinners. ? Taking over-the-counter medicines, vitamins, herbs, and supplements. ? Taking medicines such as aspirin and ibuprofen. These medicines can thin your blood. Do not take these medicines unless your health care provider tells you to take them.  You may have an exam or testing. This can include a tests to: ? Check for clots and check blood flow using sound waves (Doppler ultrasound). ? Observe how blood flows through your veins by injecting a dye that outlines your veins on X-rays (angiogram). This test is used in rare cases. What happens during the procedure? One of the following procedures will be performed: Sclerotherapy This procedure is often used for small to medium veins.  A chemical (sclerosant) that irritates the lining of the vein will be injected into the vein. This will cause the varicose vein to be closed off. Sclerosants in different amounts and strengths can be used, depending on the size and location of the vein.  All of the varicose vein sites will be injected. You may need more than one treatment because new varicose veins may develop, or more than one injection may be needed for each varicose vein.  Laser treatment There are two ways that lasers are used to treat varicose veins:  Light energy from a laser may be directed onto the vein through the skin.  A needle may be used to pass a thin laser catheter into the vein to cause it to close. You may need more than one treatment if the vein re-opens. In some cases,   laser treatment may be combined with sclerotherapy. Radiofrequency vein ablation   You will be given a medicine that numbs the area (local anesthetic).  A small incision will be made near the varicose vein.  A thin tube (catheter) will be threaded into your vein.  The tip of the catheter will deploy electrodes.  The  electrodes will deliver electrical energy to produce heat that closes off the vein. What happens after the procedure?  A bandage (dressing) may be used to cover the injection site or incisions.  You may have to wear compression stockings. These stockings help to prevent blood clots and reduce swelling in your legs.  Return to your normal activities as told by your health care provider. Summary  Varicose veins are swollen, twisted veins that are visible under the skin. They occur most often in the legs.  Various procedures can be used to treat varicose veins. You may need a procedure to treat your varicose veins if they are causing symptoms or complications, or if lifestyle changes have not helped.  Your health care provider will discuss the method that is best for you based on your condition. This information is not intended to replace advice given to you by your health care provider. Make sure you discuss any questions you have with your health care provider. Document Revised: 05/02/2018 Document Reviewed: 04/20/2016 Elsevier Patient Education  2020 [employer]  

## 2019-03-03 ENCOUNTER — Encounter (INDEPENDENT_AMBULATORY_CARE_PROVIDER_SITE_OTHER): Payer: Self-pay

## 2019-03-03 ENCOUNTER — Ambulatory Visit (INDEPENDENT_AMBULATORY_CARE_PROVIDER_SITE_OTHER): Payer: Medicare HMO | Admitting: Nurse Practitioner

## 2019-03-03 ENCOUNTER — Other Ambulatory Visit: Payer: Self-pay

## 2019-03-03 VITALS — BP 162/91 | HR 55 | Resp 16 | Wt 279.0 lb

## 2019-03-03 DIAGNOSIS — I83813 Varicose veins of bilateral lower extremities with pain: Secondary | ICD-10-CM | POA: Diagnosis not present

## 2019-03-03 NOTE — Progress Notes (Signed)
History of Present Illness  There is no documented history at this time  Assessments & Plan   There are no diagnoses linked to this encounter.    Additional instructions  Subjective:  Patient presents with venous ulcer of the Left lower extremity.    Procedure:  3 layer unna wrap was placed Left lower extremity.   Plan:   Follow up in one week.  

## 2019-03-06 ENCOUNTER — Encounter (INDEPENDENT_AMBULATORY_CARE_PROVIDER_SITE_OTHER): Payer: Self-pay | Admitting: Vascular Surgery

## 2019-03-06 ENCOUNTER — Ambulatory Visit (INDEPENDENT_AMBULATORY_CARE_PROVIDER_SITE_OTHER): Payer: Medicare HMO | Admitting: Vascular Surgery

## 2019-03-06 ENCOUNTER — Other Ambulatory Visit: Payer: Self-pay

## 2019-03-06 VITALS — BP 153/81 | HR 53 | Resp 16 | Wt 276.0 lb

## 2019-03-06 DIAGNOSIS — I83812 Varicose veins of left lower extremities with pain: Secondary | ICD-10-CM | POA: Diagnosis not present

## 2019-03-06 DIAGNOSIS — I83813 Varicose veins of bilateral lower extremities with pain: Secondary | ICD-10-CM

## 2019-03-06 NOTE — Progress Notes (Signed)
Brianna Dalton is a 74 y.o. female who presents with symptomatic venous reflux  Past Medical History:  Diagnosis Date  . Angina pectoris syndrome ([place])   . Arterial hemorrhage    Lt ankle possible venous bleed  . Arthritis   . Hypertension   . Hypothyroidism   . Neuropathy   . Varicose vein of leg     Past Surgical History:  Procedure Laterality Date  . ABDOMINAL HYSTERECTOMY    . BREAST BIOPSY Right 01/19/2013   stereo biopsy of two areas neg  . JOINT REPLACEMENT     lt knee  . KNEE ARTHROPLASTY Right 04/27/2015   Procedure: COMPUTER ASSISTED TOTAL KNEE ARTHROPLASTY;  Surgeon: Dereck Leep, MD;  Location: [facility];  Service: Orthopedics;  Laterality: Right;  . LEFT HEART CATH AND CORONARY ANGIOGRAPHY N/A 04/10/2017   Procedure: LEFT HEART CATH AND CORONARY ANGIOGRAPHY;  Surgeon: Teodoro Spray, MD;  Location: [facility];  Service: Cardiovascular;  Laterality: N/A;  . SHOULDER ARTHROSCOPY Left   . THYROID SURGERY Bilateral   . TUMOR REMOVAL Right    neck     Current Outpatient Medications:  .  allopurinol (ZYLOPRIM) 300 MG tablet, Take 300 mg by mouth daily., Disp: , Rfl: 11 .  aspirin EC 81 MG tablet, Take 81 mg by mouth daily., Disp: , Rfl:  .  colchicine 0.6 MG tablet, Take 0.6 mg by mouth as needed. , Disp: , Rfl:  .  enalapril (VASOTEC) 20 MG tablet, Take 20 mg by mouth daily., Disp: , Rfl:  .  hydrochlorothiazide (HYDRODIURIL) 25 MG tablet, Take 25 mg by mouth daily., Disp: , Rfl:  .  Levothyroxine Sodium 25 MCG CAPS, Take 25 mcg by mouth daily before breakfast. , Disp: , Rfl:  .  metoprolol tartrate (LOPRESSOR) 25 MG tablet, Take 12.5 mg by mouth 2 (two) times daily., Disp: , Rfl:  .  torsemide (DEMADEX) 10 MG tablet, Take 10 mg by mouth daily as needed., Disp: , Rfl:  .  traMADol (ULTRAM) 50 MG tablet, Take 1-2 tablets (50-100 mg total) by mouth every 4 (four) hours as needed for moderate pain. (Patient not taking: Reported on 03/06/2019), Disp: 30 tablet,  Rfl: 0  Allergies  Allergen Reactions  . Oxycodone-Acetaminophen Hives, Rash, Shortness Of Breath and Swelling  . Aloe Vera Hives  . Amlodipine   . Hydrocodone Hives  . Latex     NEGATIVE BY IgE (<0.10)  . Morphine And Related Other (See Comments)    Nausea delusion   . Penicillins Hives    Has patient had a PCN reaction causing immediate rash, facial/tongue/throat swelling, SOB or lightheadedness with hypotension: Yes Has patient had a PCN reaction causing severe rash involving mucus membranes or skin necrosis: No Has patient had a PCN reaction that required hospitalization: No Has patient had a PCN reaction occurring within the last 10 years: No If all of the above answers are "NO", then may proceed with Cephalosporin use.  . Meperidine Rash     Varicose veins of lower extremity with pain, bilateral     PLAN: The patient's left lower extremity was sterilely prepped and draped. The ultrasound machine was used to visualize the saphenous vein throughout its course. A segment in the mid calf was selected for access. The saphenous vein was accessed without difficulty using ultrasound guidance with a micropuncture needle. A 0.018 wire was then placed beyond the saphenofemoral junction and the needle was removed. The 65 cm sheath was then  placed over the wire and the wire and dilator were removed. The laser fiber was then placed through the sheath and its tip was placed approximately 5 centimeters below the saphenofemoral junction. Tumescent anesthesia was then created with a dilute lidocaine solution. Laser energy was then delivered with constant withdrawal of the sheath and laser fiber. Approximately 1709 joules of energy were delivered over a length of 43 centimeters using a 1470 Hz VenaCure machine at 7 W. Sterile dressings were placed. The patient tolerated the procedure well without obvious complications.   Follow-up in 1 week with post-laser duplex.

## 2019-03-09 ENCOUNTER — Ambulatory Visit (INDEPENDENT_AMBULATORY_CARE_PROVIDER_SITE_OTHER): Payer: Medicare HMO | Admitting: Nurse Practitioner

## 2019-03-09 ENCOUNTER — Encounter (INDEPENDENT_AMBULATORY_CARE_PROVIDER_SITE_OTHER): Payer: Self-pay

## 2019-03-09 ENCOUNTER — Other Ambulatory Visit: Payer: Self-pay

## 2019-03-09 ENCOUNTER — Ambulatory Visit (INDEPENDENT_AMBULATORY_CARE_PROVIDER_SITE_OTHER): Payer: Medicare HMO

## 2019-03-09 VITALS — BP 153/80 | HR 54 | Resp 16

## 2019-03-09 DIAGNOSIS — I83813 Varicose veins of bilateral lower extremities with pain: Secondary | ICD-10-CM

## 2019-03-09 DIAGNOSIS — L97321 Non-pressure chronic ulcer of left ankle limited to breakdown of skin: Secondary | ICD-10-CM | POA: Diagnosis not present

## 2019-03-09 NOTE — Progress Notes (Signed)
History of Present Illness  There is no documented history at this time  Assessments & Plan   There are no diagnoses linked to this encounter.    Additional instructions  Subjective:  Patient presents with venous ulcer of the Left lower extremity.    Procedure:  3 layer unna wrap was placed Left lower extremity.   Plan:   Follow up in one week.  

## 2019-03-10 ENCOUNTER — Encounter (INDEPENDENT_AMBULATORY_CARE_PROVIDER_SITE_OTHER): Payer: Medicare HMO

## 2019-03-12 ENCOUNTER — Telehealth (INDEPENDENT_AMBULATORY_CARE_PROVIDER_SITE_OTHER): Payer: Self-pay

## 2019-03-12 NOTE — Telephone Encounter (Signed)
Patient called and left a message regarding her Unna boot she did not leave a detailed message, I returned the call and had to leave a message letting her know that our office was closed today and to call tomorrow.

## 2019-03-17 ENCOUNTER — Ambulatory Visit (INDEPENDENT_AMBULATORY_CARE_PROVIDER_SITE_OTHER): Payer: Medicare HMO | Admitting: Vascular Surgery

## 2019-03-17 ENCOUNTER — Other Ambulatory Visit: Payer: Self-pay

## 2019-03-17 ENCOUNTER — Encounter (INDEPENDENT_AMBULATORY_CARE_PROVIDER_SITE_OTHER): Payer: Self-pay | Admitting: Vascular Surgery

## 2019-03-17 VITALS — BP 139/79 | HR 59 | Resp 12 | Ht 70.0 in | Wt 277.0 lb

## 2019-03-17 DIAGNOSIS — E785 Hyperlipidemia, unspecified: Secondary | ICD-10-CM

## 2019-03-17 DIAGNOSIS — I83813 Varicose veins of bilateral lower extremities with pain: Secondary | ICD-10-CM

## 2019-03-17 DIAGNOSIS — I10 Essential (primary) hypertension: Secondary | ICD-10-CM

## 2019-03-17 NOTE — Patient Instructions (Signed)
Sclerotherapy  Sclerotherapy is a procedure that is done to improve the appearance of varicose veins and spider veins and to help relieve aching, swelling, cramping, and pain in the legs. Varicose veins are veins that have become enlarged, bulging, and twisted due to a damaged valve that causes blood to collect (pool) in the veins. Spider veins are small varicose veins. Sclerotherapy usually works best for smaller spider and varicose veins. This procedure involves injecting a chemical into the vein to close it off. You may need more than one treatment to close a vein all the way. Sclerotherapy is usually performed on the legs because that is where varicose and spider veins most often occur. Tell a health care provider about:  Any allergies you have.  All medicines you are taking, including vitamins, herbs, eye drops, creams, and over-the-counter medicines.  Any blood disorders you have.  Any surgeries you have had.  Any medical conditions you have.  Whether you are pregnant or may be pregnant. What are the risks? Generally, this is a safe procedure. However, problems may occur, including:  Infection.  Bleeding.  Allergic reactions to medicines or dyes.  Blood clots.  Nerve damage.  Bruising and scarring.  Darkened skin around the area. What happens before the procedure?  Do not use lotions or creams on your legs unless your health care provider approves.  Follow instructions from your health care provider about eating and drinking restrictions.  Do not use any products that contain nicotine or tobacco, such as cigarettes and e-cigarettes. If you need help quitting, ask your health care provider.  Ask your health care provider about: ? Changing or stopping your regular medicines. This is especially important if you are taking diabetes medicines or blood thinners. ? Taking medicines such as aspirin and ibuprofen. These medicines can thin your blood. Do not take these  medicines before your procedure if your health care provider instructs you not to.  You may have an ultrasound of the affected area to check for blood clots and to check blood flow.  In rare cases, you may have an X-ray procedure to check how blood flows through your veins (angiogram). For an angiogram, a dye is injected to outline your veins on X-rays. What happens during the procedure?  To lower your risk of infection: ? Your health care team will wash or sanitize their hands. ? Your skin will be washed with soap. ? Hair may be removed from the treatment area.  A small, thin needle will be used to inject a chemical (sclerosant) into your varicose vein. The sclerosant will irritate the lining of the vein and cause the vein to close below the injection site. You may feel some stinging, burning, or irritation.  The injection may be repeated for more than one varicose vein.  The injection area will be wrapped with elastic bandages. The procedure may vary among health care providers and hospitals. What happens after the procedure?  Your injection area will be wrapped with elastic bandages. If there is bleeding, the bandages may be changed.  Do not drive until your health care provider approves. You may need to wait 1-2 days before driving.  You will need to wear compression stockings for about a week, or as long as your health care provider recommends. Summary  Sclerotherapy is a procedure that is done to improve the appearance of varicose veins and spider veins and to help relieve aching, swelling, cramping, and pain in the legs.  A small, thin needle is   used to inject a chemical (sclerosant) into a spider vein or varicose vein to close it off.  Elastic bandages will be wrapped around the injection area after the procedure. This information is not intended to replace advice given to you by your health care provider. Make sure you discuss any questions you have with your health care  provider. Document Revised: 05/02/2018 Document Reviewed: 02/28/2016 Elsevier Patient Education  2020 [employer]  

## 2019-03-17 NOTE — Assessment & Plan Note (Signed)
The patient has undergone successful laser ablation of the left great saphenous vein with good results but residual painful varicosities are still present.  As such, I would recommend foam sclerotherapy be performed for these residual varicosities.  I discussed the risks and benefits of this procedure.  The patient is agreeable to proceed and this will be scheduled in the near future at her convenience.

## 2019-03-17 NOTE — Progress Notes (Signed)
MRN : [id number]  ALIANNA Dalton is a 74 y.o. (1945/11/18) female who presents with chief complaint of  Chief Complaint  Patient presents with  . Follow-up    Unna Check. Laser done  .  History of Present Illness: Patient returns today in follow up of her venous disease.  She is not quite 2 weeks status post left great saphenous vein laser ablation.  Her swelling in her leg is significantly improved already.  She does still have appropriate soreness particularly in her thigh overlying the saphenous vein that was ablated.  Her varicosities have decreased in size, but she does still have several prominent varicosities on the medial aspect of the calf and thigh which are tender to palpation.  Current Outpatient Medications  Medication Sig Dispense Refill  . allopurinol (ZYLOPRIM) 300 MG tablet Take 300 mg by mouth daily.  11  . aspirin EC 81 MG tablet Take 81 mg by mouth daily.    . colchicine 0.6 MG tablet Take 0.6 mg by mouth as needed.     . enalapril (VASOTEC) 20 MG tablet Take 20 mg by mouth daily.    . hydrochlorothiazide (HYDRODIURIL) 25 MG tablet Take 25 mg by mouth daily.    . Levothyroxine Sodium 25 MCG CAPS Take 25 mcg by mouth daily before breakfast.     . metoprolol tartrate (LOPRESSOR) 25 MG tablet Take 12.5 mg by mouth 2 (two) times daily.    Marland Kitchen torsemide (DEMADEX) 10 MG tablet Take 10 mg by mouth daily as needed.    . traMADol (ULTRAM) 50 MG tablet Take 1-2 tablets (50-100 mg total) by mouth every 4 (four) hours as needed for moderate pain. 30 tablet 0   No current facility-administered medications for this visit.    Past Medical History:  Diagnosis Date  . Angina pectoris syndrome ([place])   . Arterial hemorrhage    Lt ankle possible venous bleed  . Arthritis   . Hypertension   . Hypothyroidism   . Neuropathy   . Varicose vein of leg     Past Surgical History:  Procedure Laterality Date  . ABDOMINAL HYSTERECTOMY    . BREAST BIOPSY Right 01/19/2013   stereo  biopsy of two areas neg  . JOINT REPLACEMENT     lt knee  . KNEE ARTHROPLASTY Right 04/27/2015   Procedure: COMPUTER ASSISTED TOTAL KNEE ARTHROPLASTY;  Surgeon: Dereck Leep, MD;  Location: [facility];  Service: Orthopedics;  Laterality: Right;  . LEFT HEART CATH AND CORONARY ANGIOGRAPHY N/A 04/10/2017   Procedure: LEFT HEART CATH AND CORONARY ANGIOGRAPHY;  Surgeon: Teodoro Spray, MD;  Location: [facility];  Service: Cardiovascular;  Laterality: N/A;  . SHOULDER ARTHROSCOPY Left   . THYROID SURGERY Bilateral   . TUMOR REMOVAL Right    neck     Social History   Tobacco Use  . Smoking status: Never Smoker  . Smokeless tobacco: Never Used  Substance Use Topics  . Alcohol use: No  . Drug use: No     Family History No bleeding or clotting disorders  Allergies  Allergen Reactions  . Oxycodone-Acetaminophen Hives, Rash, Shortness Of Breath and Swelling  . Aloe Vera Hives  . Amlodipine   . Hydrocodone Hives  . Latex     NEGATIVE BY IgE (<0.10)  . Morphine And Related Other (See Comments)    Nausea delusion   . Penicillins Hives    Has patient had a PCN reaction causing immediate rash, facial/tongue/throat swelling,  SOB or lightheadedness with hypotension: Yes Has patient had a PCN reaction causing severe rash involving mucus membranes or skin necrosis: No Has patient had a PCN reaction that required hospitalization: No Has patient had a PCN reaction occurring within the last 10 years: No If all of the above answers are "NO", then may proceed with Cephalosporin use.  . Meperidine Rash   REVIEW OF SYSTEMS(Negative unless checked)  Constitutional: [] ??Weight loss[] ??Fever[] ??Chills Cardiac:[] ??Chest pain[] ??Chest pressure[] ??Palpitations [] ??Shortness of breath when laying flat [] ??Shortness of breath at rest [] ??Shortness of breath with exertion. Vascular: [] ??Pain in legs with walking[] ??Pain in legsat rest[] ??Pain in legs when laying  flat [] ??Claudication [] ??Pain in feet when walking [] ??Pain in feet at rest [] ??Pain in feet when laying flat [] ??History of DVT [] ??Phlebitis [x] ??Swelling in legs [x] ??Varicose veins [] ??Non-healing ulcers Pulmonary: [] ??Uses home oxygen [] ??Productive cough[] ??Hemoptysis [] ??Wheeze [] ??COPD [] ??Asthma Neurologic: [] ??Dizziness [] ??Blackouts [] ??Seizures [] ??History of stroke [] ??History of TIA[] ??Aphasia [] ??Temporary blindness[] ??Dysphagia [] ??Weaknessor numbness in arms [] ??Weakness or numbnessin legs Musculoskeletal: [x] ??Arthritis [] ??Joint swelling [x] ??Joint pain [] ??Low back pain Hematologic:[] ??Easy bruising[] ??Easy bleeding [] ??Hypercoagulable state [] ??Anemic [] ??Hepatitis Gastrointestinal:[] ??Blood in stool[] ??Vomiting blood[] ??Gastroesophageal reflux/heartburn[] ??Abdominal pain Genitourinary: [] ??Chronic kidney disease [] ??Difficulturination [] ??Frequenturination [] ??Burning with urination[] ??Hematuria Skin: [] ??Rashes [] ??Ulcers [] ??Wounds Psychological: [] ??History of anxiety[] ??History of major depression.    Physical Examination  BP 139/79   Pulse (!) 59   Resp 12   Ht 5\' 10"  (1.778 m)   Wt 277 lb (125.6 kg)   BMI 39.75 kg/m  Gen:  WD/WN, NAD Head: [place]/AT, No temporalis wasting. Ear/Nose/Throat: Hearing grossly intact, nares w/o erythema or drainage Eyes: Conjunctiva clear. Sclera non-icteric Neck: Supple.  Trachea midline Pulmonary:  Good air movement, no use of accessory muscles.  Cardiac: RRR, no JVD Vascular:  Vessel Right Left  Radial Palpable Palpable                          PT Palpable Palpable  DP Palpable Palpable    Musculoskeletal: M/S 5/5 throughout.  No deformity or atrophy.  Trace to 1+ left lower extremity edema at this point.  No significant right lower extremity edema.  Scattered varicosities measuring about 2 to 2-1/2 mm in the medial calf and  thigh area more prominent on the left than the right Neurologic: Sensation grossly intact in extremities.  Symmetrical.  Speech is fluent.  Psychiatric: Judgment intact, Mood & affect appropriate for pt's clinical situation. Dermatologic: No rashes or ulcers noted.  No cellulitis or open wounds.       Labs No results found for this or any previous visit (from the past 2160 hour(s)).  Radiology VAS [place] LASER ABLATION OF SUPERFICIAL VEI  Result Date: 03/10/2019  Lower Venous Study Indications: Post ablation left.  Performing Technologist: Concha Norway RVT  Examination Guidelines: A complete evaluation includes B-mode imaging, spectral Doppler, color Doppler, and power Doppler as needed of all accessible portions of each vessel. Bilateral testing is considered an integral part of a complete examination. Limited examinations for reoccurring indications may be performed as noted.  +------+---------------+---------+-----------+----------+--------------+ LEFT  CompressibilityPhasicitySpontaneityPropertiesThrombus Aging +------+---------------+---------+-----------+----------+--------------+ CFV   Full           Yes      Yes                                 +------+---------------+---------+-----------+----------+--------------+ SFJ   Full           Yes      Yes                                 +------+---------------+---------+-----------+----------+--------------+  FV MidFull           Yes      Yes                                 +------+---------------+---------+-----------+----------+--------------+ POP   Full           Yes      Yes                                 +------+---------------+---------+-----------+----------+--------------+ GSV   Full                                                        +------+---------------+---------+-----------+----------+--------------+     Summary: Left: The left GSV is closed post ablation from the access point in calf to mid  thigh. Partial compression in mid thigh. Multiple varicosities have clotted off as well.  *See table(s) above for measurements and observations. Electronically signed by Leotis Pain MD on 03/10/2019 at 1:28:22 PM.    Final     Assessment/Plan HLD (hyperlipidemia) lipid control important in reducing the progression of atherosclerotic disease.   Morbid obesity ([place]) Worsens LE swelling  BP (high blood pressure) blood pressure control important in reducing the progression of atherosclerotic disease. On appropriate oral medications.  Varicose veins of lower extremity with pain, bilateral The patient has undergone successful laser ablation of the left great saphenous vein with good results but residual painful varicosities are still present.  As such, I would recommend foam sclerotherapy be performed for these residual varicosities.  I discussed the risks and benefits of this procedure.  The patient is agreeable to proceed and this will be scheduled in the near future at her convenience.    Leotis Pain, MD  03/17/2019 9:38 AM    This note was created with Dragon medical transcription system.  Any errors from dictation are purely unintentional

## 2019-03-27 ENCOUNTER — Emergency Department: Payer: Medicare HMO

## 2019-03-27 ENCOUNTER — Inpatient Hospital Stay
Admission: EM | Admit: 2019-03-27 | Discharge: 2019-03-29 | DRG: 418 | Disposition: A | Discharge status: Home / Self Care | Payer: Medicare HMO | Attending: Internal Medicine | Admitting: Internal Medicine

## 2019-03-27 ENCOUNTER — Encounter: Payer: Self-pay | Admitting: Internal Medicine

## 2019-03-27 ENCOUNTER — Other Ambulatory Visit: Payer: Self-pay

## 2019-03-27 DIAGNOSIS — Z90710 Acquired absence of both cervix and uterus: Secondary | ICD-10-CM | POA: Diagnosis not present

## 2019-03-27 DIAGNOSIS — Z79899 Other long term (current) drug therapy: Secondary | ICD-10-CM

## 2019-03-27 DIAGNOSIS — I10 Essential (primary) hypertension: Secondary | ICD-10-CM | POA: Diagnosis not present

## 2019-03-27 DIAGNOSIS — Z91040 Latex allergy status: Secondary | ICD-10-CM | POA: Diagnosis not present

## 2019-03-27 DIAGNOSIS — I359 Nonrheumatic aortic valve disorder, unspecified: Secondary | ICD-10-CM | POA: Diagnosis present

## 2019-03-27 DIAGNOSIS — R079 Chest pain, unspecified: Secondary | ICD-10-CM | POA: Diagnosis not present

## 2019-03-27 DIAGNOSIS — R0789 Other chest pain: Secondary | ICD-10-CM | POA: Diagnosis not present

## 2019-03-27 DIAGNOSIS — Z6841 Body Mass Index (BMI) 40.0 and over, adult: Secondary | ICD-10-CM | POA: Diagnosis not present

## 2019-03-27 DIAGNOSIS — R0689 Other abnormalities of breathing: Secondary | ICD-10-CM | POA: Diagnosis not present

## 2019-03-27 DIAGNOSIS — Z7982 Long term (current) use of aspirin: Secondary | ICD-10-CM | POA: Diagnosis not present

## 2019-03-27 DIAGNOSIS — R1011 Right upper quadrant pain: Secondary | ICD-10-CM | POA: Diagnosis not present

## 2019-03-27 DIAGNOSIS — K810 Acute cholecystitis: Secondary | ICD-10-CM | POA: Diagnosis present

## 2019-03-27 DIAGNOSIS — R7989 Other specified abnormal findings of blood chemistry: Secondary | ICD-10-CM | POA: Diagnosis not present

## 2019-03-27 DIAGNOSIS — Z91048 Other nonmedicinal substance allergy status: Secondary | ICD-10-CM | POA: Diagnosis not present

## 2019-03-27 DIAGNOSIS — Z888 Allergy status to other drugs, medicaments and biological substances status: Secondary | ICD-10-CM | POA: Diagnosis not present

## 2019-03-27 DIAGNOSIS — K8062 Calculus of gallbladder and bile duct with acute cholecystitis without obstruction: Secondary | ICD-10-CM | POA: Diagnosis not present

## 2019-03-27 DIAGNOSIS — Z20822 Contact with and (suspected) exposure to covid-19: Secondary | ICD-10-CM | POA: Diagnosis not present

## 2019-03-27 DIAGNOSIS — I214 Non-ST elevation (NSTEMI) myocardial infarction: Secondary | ICD-10-CM | POA: Diagnosis present

## 2019-03-27 DIAGNOSIS — I8390 Asymptomatic varicose veins of unspecified lower extremity: Secondary | ICD-10-CM | POA: Diagnosis present

## 2019-03-27 DIAGNOSIS — K8020 Calculus of gallbladder without cholecystitis without obstruction: Secondary | ICD-10-CM | POA: Diagnosis present

## 2019-03-27 DIAGNOSIS — M109 Gout, unspecified: Secondary | ICD-10-CM | POA: Diagnosis not present

## 2019-03-27 DIAGNOSIS — Z885 Allergy status to narcotic agent status: Secondary | ICD-10-CM

## 2019-03-27 DIAGNOSIS — G629 Polyneuropathy, unspecified: Secondary | ICD-10-CM | POA: Diagnosis present

## 2019-03-27 DIAGNOSIS — E785 Hyperlipidemia, unspecified: Secondary | ICD-10-CM | POA: Diagnosis present

## 2019-03-27 DIAGNOSIS — I248 Other forms of acute ischemic heart disease: Secondary | ICD-10-CM | POA: Diagnosis not present

## 2019-03-27 DIAGNOSIS — I160 Hypertensive urgency: Secondary | ICD-10-CM | POA: Diagnosis present

## 2019-03-27 DIAGNOSIS — Z96651 Presence of right artificial knee joint: Secondary | ICD-10-CM | POA: Diagnosis present

## 2019-03-27 DIAGNOSIS — R231 Pallor: Secondary | ICD-10-CM | POA: Diagnosis not present

## 2019-03-27 DIAGNOSIS — R0989 Other specified symptoms and signs involving the circulatory and respiratory systems: Secondary | ICD-10-CM | POA: Diagnosis not present

## 2019-03-27 DIAGNOSIS — K760 Fatty (change of) liver, not elsewhere classified: Secondary | ICD-10-CM | POA: Diagnosis not present

## 2019-03-27 DIAGNOSIS — M1990 Unspecified osteoarthritis, unspecified site: Secondary | ICD-10-CM | POA: Diagnosis present

## 2019-03-27 DIAGNOSIS — R778 Other specified abnormalities of plasma proteins: Secondary | ICD-10-CM | POA: Diagnosis not present

## 2019-03-27 DIAGNOSIS — Z6839 Body mass index (BMI) 39.0-39.9, adult: Secondary | ICD-10-CM | POA: Diagnosis not present

## 2019-03-27 DIAGNOSIS — K819 Cholecystitis, unspecified: Secondary | ICD-10-CM | POA: Diagnosis not present

## 2019-03-27 DIAGNOSIS — K8000 Calculus of gallbladder with acute cholecystitis without obstruction: Secondary | ICD-10-CM | POA: Diagnosis not present

## 2019-03-27 DIAGNOSIS — Z880 Allergy status to penicillin: Secondary | ICD-10-CM

## 2019-03-27 DIAGNOSIS — K8010 Calculus of gallbladder with chronic cholecystitis without obstruction: Secondary | ICD-10-CM | POA: Diagnosis not present

## 2019-03-27 DIAGNOSIS — K449 Diaphragmatic hernia without obstruction or gangrene: Secondary | ICD-10-CM | POA: Diagnosis not present

## 2019-03-27 DIAGNOSIS — K219 Gastro-esophageal reflux disease without esophagitis: Secondary | ICD-10-CM | POA: Diagnosis not present

## 2019-03-27 DIAGNOSIS — E039 Hypothyroidism, unspecified: Secondary | ICD-10-CM | POA: Diagnosis present

## 2019-03-27 DIAGNOSIS — Z79890 Hormone replacement therapy (postmenopausal): Secondary | ICD-10-CM

## 2019-03-27 LAB — CBC WITH DIFFERENTIAL/PLATELET
Abs Immature Granulocytes: 0.03 10*3/uL (ref 0.00–0.07)
Basophils Absolute: 0.1 10*3/uL (ref 0.0–0.1)
Basophils Relative: 1 %
Eosinophils Absolute: 0.2 10*3/uL (ref 0.0–0.5)
Eosinophils Relative: 2 %
HCT: 45.3 % (ref 36.0–46.0)
Hemoglobin: 14.9 g/dL (ref 12.0–15.0)
Immature Granulocytes: 0 %
Lymphocytes Relative: 14 %
Lymphs Abs: 1.5 10*3/uL (ref 0.7–4.0)
MCH: 30.9 pg (ref 26.0–34.0)
MCHC: 32.9 g/dL (ref 30.0–36.0)
MCV: 94 fL (ref 80.0–100.0)
Monocytes Absolute: 0.7 10*3/uL (ref 0.1–1.0)
Monocytes Relative: 7 %
Neutro Abs: 8.3 10*3/uL — ABNORMAL HIGH (ref 1.7–7.7)
Neutrophils Relative %: 76 %
Platelets: 189 10*3/uL (ref 150–400)
RBC: 4.82 MIL/uL (ref 3.87–5.11)
RDW: 13.2 % (ref 11.5–15.5)
WBC: 10.8 10*3/uL — ABNORMAL HIGH (ref 4.0–10.5)
nRBC: 0 % (ref 0.0–0.2)

## 2019-03-27 LAB — SARS CORONAVIRUS 2 (TAT 6-24 HRS): SARS Coronavirus 2: NEGATIVE

## 2019-03-27 LAB — COMPREHENSIVE METABOLIC PANEL
ALT: 23 U/L (ref 0–44)
AST: 33 U/L (ref 15–41)
Albumin: 4.2 g/dL (ref 3.5–5.0)
Alkaline Phosphatase: 75 U/L (ref 38–126)
Anion gap: 10 (ref 5–15)
BUN: 15 mg/dL (ref 8–23)
CO2: 28 mmol/L (ref 22–32)
Calcium: 9.1 mg/dL (ref 8.9–10.3)
Chloride: 104 mmol/L (ref 98–111)
Creatinine, Ser: 0.72 mg/dL (ref 0.44–1.00)
GFR calc Af Amer: >60 mL/min (ref >60)
GFR calc non Af Amer: >60 mL/min (ref >60)
Glucose, Bld: 118 mg/dL — ABNORMAL HIGH (ref 70–99)
Potassium: 3.9 mmol/L (ref 3.5–5.1)
Sodium: 142 mmol/L (ref 135–145)
Total Bilirubin: 1.1 mg/dL (ref 0.3–1.2)
Total Protein: 7.2 g/dL (ref 6.5–8.1)

## 2019-03-27 LAB — TROPONIN I (HIGH SENSITIVITY)
Troponin I (High Sensitivity): 7 ng/L (ref <18)
Troponin I (High Sensitivity): 39 ng/L — ABNORMAL HIGH (ref <18)
Troponin I (High Sensitivity): 44 ng/L — ABNORMAL HIGH (ref <18)
Troponin I (High Sensitivity): 39 ng/L — ABNORMAL HIGH (ref <18)

## 2019-03-27 LAB — APTT: aPTT: 28 seconds (ref 24–36)

## 2019-03-27 LAB — LIPASE, BLOOD: Lipase: 31 U/L (ref 11–51)

## 2019-03-27 LAB — PROTIME-INR
INR: 1.1 (ref 0.8–1.2)
Prothrombin Time: 14 seconds (ref 11.4–15.2)

## 2019-03-27 MED ORDER — METOCLOPRAMIDE HCL 5 MG/ML IJ SOLN
10.0000 mg | Freq: Once | INTRAMUSCULAR | Status: AC
  Administered 2019-03-27: 10 mg via INTRAVENOUS
  Filled 2019-03-27: qty 2

## 2019-03-27 MED ORDER — COLCHICINE 0.6 MG PO TABS
0.6000 mg | ORAL_TABLET | Freq: Every day | ORAL | Status: DC | PRN
  Filled 2019-03-27: qty 1

## 2019-03-27 MED ORDER — ONDANSETRON HCL 4 MG/2ML IJ SOLN
4.0000 mg | Freq: Four times a day (QID) | INTRAMUSCULAR | Status: DC | PRN
  Administered 2019-03-28: 4 mg via INTRAVENOUS
  Filled 2019-03-27: qty 2

## 2019-03-27 MED ORDER — METOPROLOL TARTRATE 25 MG PO TABS
12.5000 mg | ORAL_TABLET | Freq: Two times a day (BID) | ORAL | Status: DC
  Administered 2019-03-27 – 2019-03-29 (×4): 12.5 mg via ORAL
  Filled 2019-03-27 (×4): qty 1

## 2019-03-27 MED ORDER — ONDANSETRON HCL 4 MG/2ML IJ SOLN
4.0000 mg | Freq: Once | INTRAMUSCULAR | Status: AC
  Administered 2019-03-27: 09:00:00 4 mg via INTRAVENOUS
  Filled 2019-03-27: qty 2

## 2019-03-27 MED ORDER — ACETAMINOPHEN 500 MG PO TABS
1000.0000 mg | ORAL_TABLET | Freq: Once | ORAL | Status: AC
  Administered 2019-03-27: 1000 mg via ORAL
  Filled 2019-03-27: qty 2

## 2019-03-27 MED ORDER — ASPIRIN 81 MG PO CHEW: 324.0000 mg | CHEWABLE_TABLET | ORAL | Status: DC

## 2019-03-27 MED ORDER — LACTATED RINGERS IV SOLN: INTRAVENOUS | Status: DC

## 2019-03-27 MED ORDER — ASPIRIN EC 81 MG PO TBEC: 81.0000 mg | DELAYED_RELEASE_TABLET | Freq: Every day | ORAL | Status: DC

## 2019-03-27 MED ORDER — LEVOTHYROXINE SODIUM 25 MCG PO TABS
25.0000 ug | ORAL_TABLET | Freq: Every day | ORAL | Status: DC
  Administered 2019-03-28: 25 ug via ORAL
  Filled 2019-03-27 (×2): qty 1

## 2019-03-27 MED ORDER — HEPARIN (PORCINE) 25000 UT/250ML-% IV SOLN
1200.0000 [IU]/h | INTRAVENOUS | Status: DC
  Filled 2019-03-27: qty 250

## 2019-03-27 MED ORDER — KETOROLAC TROMETHAMINE 30 MG/ML IJ SOLN
15.0000 mg | Freq: Once | INTRAMUSCULAR | Status: AC
  Administered 2019-03-27: 15 mg via INTRAVENOUS
  Filled 2019-03-27: qty 1

## 2019-03-27 MED ORDER — ASPIRIN 300 MG RE SUPP: 300.0000 mg | RECTAL | Status: DC

## 2019-03-27 MED ORDER — NITROGLYCERIN 0.4 MG SL SUBL: 0.4000 mg | SUBLINGUAL_TABLET | SUBLINGUAL | Status: DC | PRN

## 2019-03-27 MED ORDER — HEPARIN BOLUS VIA INFUSION
4000.0000 [IU] | Freq: Once | INTRAVENOUS | Status: DC
  Filled 2019-03-27: qty 4000

## 2019-03-27 MED ORDER — MORPHINE SULFATE (PF) 4 MG/ML IV SOLN
4.0000 mg | INTRAVENOUS | Status: DC | PRN
  Administered 2019-03-27 – 2019-03-28 (×3): 4 mg via INTRAVENOUS
  Filled 2019-03-27 (×3): qty 1

## 2019-03-27 MED ORDER — TORSEMIDE 20 MG PO TABS
10.0000 mg | ORAL_TABLET | Freq: Every day | ORAL | Status: DC
  Administered 2019-03-27 – 2019-03-29 (×3): 10 mg via ORAL
  Filled 2019-03-27 (×3): qty 1

## 2019-03-27 MED ORDER — NITROGLYCERIN 0.4 MG SL SUBL
0.4000 mg | SUBLINGUAL_TABLET | SUBLINGUAL | Status: DC | PRN
  Administered 2019-03-27: 0.4 mg via SUBLINGUAL
  Filled 2019-03-27: qty 1

## 2019-03-27 MED ORDER — ENALAPRIL MALEATE 20 MG PO TABS
20.0000 mg | ORAL_TABLET | Freq: Every day | ORAL | Status: DC
  Administered 2019-03-27 – 2019-03-29 (×3): 20 mg via ORAL
  Filled 2019-03-27: qty 1
  Filled 2019-03-27 (×2): qty 2

## 2019-03-27 MED ORDER — MORPHINE SULFATE (PF) 2 MG/ML IV SOLN
2.0000 mg | INTRAVENOUS | Status: DC | PRN
  Administered 2019-03-27: 2 mg via INTRAVENOUS
  Filled 2019-03-27: qty 1

## 2019-03-27 MED ORDER — ALLOPURINOL 300 MG PO TABS
300.0000 mg | ORAL_TABLET | Freq: Every day | ORAL | Status: DC
  Administered 2019-03-28 – 2019-03-29 (×2): 300 mg via ORAL
  Filled 2019-03-27 (×2): qty 1

## 2019-03-27 MED ORDER — ATORVASTATIN CALCIUM 20 MG PO TABS
40.0000 mg | ORAL_TABLET | Freq: Every day | ORAL | Status: DC
  Administered 2019-03-28 – 2019-03-29 (×2): 40 mg via ORAL
  Filled 2019-03-27: qty 4
  Filled 2019-03-27 (×2): qty 2

## 2019-03-27 MED ORDER — ASPIRIN EC 81 MG PO TBEC
81.0000 mg | DELAYED_RELEASE_TABLET | Freq: Every day | ORAL | Status: DC
  Administered 2019-03-28 – 2019-03-29 (×2): 81 mg via ORAL
  Filled 2019-03-27: qty 1

## 2019-03-27 MED ORDER — ASPIRIN 81 MG PO CHEW
324.0000 mg | CHEWABLE_TABLET | Freq: Once | ORAL | Status: AC
  Administered 2019-03-27: 324 mg via ORAL
  Filled 2019-03-27: qty 4

## 2019-03-27 MED ORDER — TRAMADOL HCL 50 MG PO TABS
50.0000 mg | ORAL_TABLET | Freq: Four times a day (QID) | ORAL | Status: DC | PRN
  Administered 2019-03-27 – 2019-03-29 (×3): 50 mg via ORAL
  Filled 2019-03-27 (×3): qty 1

## 2019-03-27 NOTE — ED Notes (Signed)
MD gave this RN verbal confirmation to give more morphine due to pt's pain level.

## 2019-03-27 NOTE — ED Provider Notes (Signed)
[facility] Emergency Department Provider Note    First MD Initiated Contact with Patient 03/27/19 [phone]     (approximate)  I have reviewed the triage vital signs and the nursing notes.   HISTORY  Chief Complaint Chest Pain    HPI Brianna Dalton is a 74 y.o. female with the below listed past medical history presents the ER for evaluation of right-sided chest discomfort and right upper quadrant discomfort that started around 7 AM this morning.  States it was a stabbing pain.  Does have some achiness in her right shoulder.  Does have some nausea but is not any vomiting.  Never had pain like this before.  Denies any history of coronary disease or heart disease.  Had recent heart cath that was normal.  Does still have her gallbladder.  Denies any fevers.  Does not feel short of breath.  EMS found patient hypotensive she was given nitro spray did not feel that much of a difference.  Still rates the pain is moderate to severe.    [place] 2019:  The left ventricular systolic function is normal.  LV end diastolic pressure is normal.  The left ventricular ejection fraction is greater than 65% by visual estimate.  There is no mitral valve regurgitation.  There is no aortic valve stenosis.  There is no mitral valve stenosis and no mitral valve prolapse evident.   Normal coronary arteries Norml LV function Risk factor modification  Past Medical History:  Diagnosis Date  . Angina pectoris syndrome ([place])   . Arterial hemorrhage    Lt ankle possible venous bleed  . Arthritis   . Hypertension   . Hypothyroidism   . Neuropathy   . Varicose vein of leg    No family history on file. Past Surgical History:  Procedure Laterality Date  . ABDOMINAL HYSTERECTOMY    . BREAST BIOPSY Right 01/19/2013   stereo biopsy of two areas neg  . JOINT REPLACEMENT     lt knee  . KNEE ARTHROPLASTY Right 04/27/2015   Procedure: COMPUTER ASSISTED TOTAL KNEE ARTHROPLASTY;   Surgeon: Dereck Leep, MD;  Location: [facility] ORS;  Service: Orthopedics;  Laterality: Right;  . LEFT HEART CATH AND CORONARY ANGIOGRAPHY N/A 04/10/2017   Procedure: LEFT HEART CATH AND CORONARY ANGIOGRAPHY;  Surgeon: Teodoro Spray, MD;  Location: [facility];  Service: Cardiovascular;  Laterality: N/A;  . SHOULDER ARTHROSCOPY Left   . THYROID SURGERY Bilateral   . TUMOR REMOVAL Right    neck   Patient Active Problem List   Diagnosis Date Noted  . Elevated troponin 03/27/2019  . Cholelithiasis 03/27/2019  . Lower limb ulcer, ankle, left, limited to breakdown of skin ([place]) 02/24/2019  . Varicose veins of lower extremity with pain, bilateral 10/31/2018  . Non-cardiac chest pain 10/28/2018  . Infectious diarrhea 09/03/2018  . Lymphedema 07/30/2018  . Chronic venous insufficiency 07/30/2018  . Swelling of limb 07/15/2018  . Chest pain 04/09/2017  . Pain and swelling of lower leg, right 03/21/2017  . Swelling of foot joint, right 03/11/2017  . Encounter for long-term (current) use of high-risk medication 03/11/2017  . Acute gout of right foot 03/11/2017  . SOBOE (shortness of breath on exertion) 12/04/2016  . Bilateral carotid artery stenosis 12/04/2016  . Thyroid disease 09/15/2015  . Knee joint replaced by other means 06/12/2015  . 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 S/P total knee arthroplasty 04/27/2015  . Lumbar radiculopathy 07/07/2013  . Acid reflux 07/05/2013  .  BP (high blood pressure) 07/05/2013  . HLD (hyperlipidemia) 07/05/2013  . Adult hypothyroidism 07/05/2013  . Morbid obesity ([place]) 07/05/2013  . Impaired renal function 07/05/2013  . Arthritis 07/05/2013  . Hyperlipidemia 07/05/2013      Prior to Admission medications   Medication Sig Start Date End Date Taking? Authorizing Provider  allopurinol (ZYLOPRIM) 300 MG tablet Take 300 mg by mouth daily. 03/11/17  Yes [provider]  aspirin EC 81 MG tablet Take 81 mg by mouth daily.   Yes [provider]  colchicine 0.6 MG tablet Take 0.6 mg by mouth as needed.    Yes [provider]  levothyroxine (SYNTHROID) 125 MCG tablet Take 125 mcg by mouth daily before breakfast.    Yes [provider]  losartan (COZAAR) 50 MG tablet Take 50 mg by mouth daily. 03/14/19  Yes [provider]  metoprolol tartrate (LOPRESSOR) 25 MG tablet Take 12.5 mg by mouth 2 (two) times daily.   Yes [provider]  torsemide (DEMADEX) 10 MG tablet Take 10 mg by mouth daily as needed.   Yes [provider]  traMADol (ULTRAM) 50 MG tablet Take 1-2 tablets (50-100 mg total) by mouth every 4 (four) hours as needed for moderate pain. 04/28/15  Yes Watt Climes, PA    Allergies Oxycodone-acetaminophen, Strawberry flavor, Aloe vera, Amlodipine, Hydrocodone, Latex, Morphine and related, Penicillins, and Meperidine    Social History Social History   Tobacco Use  . Smoking status: Never Smoker  . Smokeless tobacco: Never Used  Substance Use Topics  . Alcohol use: No  . Drug use: No    Review of Systems Patient denies headaches, rhinorrhea, blurry vision, numbness, shortness of breath, chest pain, edema, cough, abdominal pain, nausea, vomiting, diarrhea, dysuria, fevers, rashes or hallucinations unless otherwise stated above in HPI. ____________________________________________   PHYSICAL EXAM:  VITAL SIGNS: Vitals:   03/27/19 1430 03/27/19 1500  BP: 115/67 122/67  Pulse: 62 (!) 58  Resp: 12 16  Temp:    SpO2: 94% 99%    Constitutional: Alert and oriented.  Eyes: Conjunctivae are normal.  Head: Atraumatic. Nose: No congestion/rhinnorhea. Mouth/Throat: Mucous membranes are moist.   Neck: No stridor. Painless ROM.  Cardiovascular: Normal rate, regular rhythm. Grossly normal heart sounds.  Good peripheral circulation. Respiratory: Normal respiratory effort.  No retractions. Lungs CTAB. Gastrointestinal: Soft with tuq ttp. No distention. No abdominal  bruits. No CVA tenderness. Genitourinary:  Musculoskeletal: No lower extremity tenderness nor edema.  No joint effusions. Neurologic:  Normal speech and language. No gross focal neurologic deficits are appreciated. No facial droop Skin:  Skin is warm, dry and intact. No rash noted. Psychiatric: Mood and affect are normal. Speech and behavior are normal.  ____________________________________________   LABS (all labs ordered are listed, but only abnormal results are displayed)  Results for orders placed or performed during the hospital encounter of 03/27/19 (from the past 24 hour(s))  CBC with Differential/Platelet     Status: Abnormal   Collection Time: 03/27/19  8:59 AM  Result Value Ref Range   WBC 10.8 (H) 4.0 - 10.5 K/uL   RBC 4.82 3.87 - 5.11 MIL/uL   Hemoglobin 14.9 12.0 - 15.0 g/dL   HCT 45.3 36.0 - 46.0 %   MCV 94.0 80.0 - 100.0 fL   MCH 30.9 26.0 - 34.0 pg   MCHC 32.9 30.0 - 36.0 g/dL   RDW 13.2 11.5 - 15.5 %   Platelets 189 150 - 400 K/uL   nRBC  0.0 0.0 - 0.2 %   Neutrophils Relative % 76 %   Neutro Abs 8.3 (H) 1.7 - 7.7 K/uL   Lymphocytes Relative 14 %   Lymphs Abs 1.5 0.7 - 4.0 K/uL   Monocytes Relative 7 %   Monocytes Absolute 0.7 0.1 - 1.0 K/uL   Eosinophils Relative 2 %   Eosinophils Absolute 0.2 0.0 - 0.5 K/uL   Basophils Relative 1 %   Basophils Absolute 0.1 0.0 - 0.1 K/uL   Immature Granulocytes 0 %   Abs Immature Granulocytes 0.03 0.00 - 0.07 K/uL  Comprehensive metabolic panel     Status: Abnormal   Collection Time: 03/27/19  8:59 AM  Result Value Ref Range   Sodium 142 135 - 145 mmol/L   Potassium 3.9 3.5 - 5.1 mmol/L   Chloride 104 98 - 111 mmol/L   CO2 28 22 - 32 mmol/L   Glucose, Bld 118 (H) 70 - 99 mg/dL   BUN 15 8 - 23 mg/dL   Creatinine, Ser 0.72 0.44 - 1.00 mg/dL   Calcium 9.1 8.9 - 10.3 mg/dL   Total Protein 7.2 6.5 - 8.1 g/dL   Albumin 4.2 3.5 - 5.0 g/dL   AST 33 15 - 41 U/L   ALT 23 0 - 44 U/L   Alkaline Phosphatase 75 38 - 126 U/L    Total Bilirubin 1.1 0.3 - 1.2 mg/dL   GFR calc non Af Amer >60 >60 mL/min   GFR calc Af Amer >60 >60 mL/min   Anion gap 10 5 - 15  Troponin I (High Sensitivity)     Status: None   Collection Time: 03/27/19  8:59 AM  Result Value Ref Range   Troponin I (High Sensitivity) 7 <18 ng/L  Lipase, blood     Status: None   Collection Time: 03/27/19  8:59 AM  Result Value Ref Range   Lipase 31 11 - 51 U/L  Troponin I (High Sensitivity)     Status: Abnormal   Collection Time: 03/27/19 11:14 AM  Result Value Ref Range   Troponin I (High Sensitivity) 39 (H) <18 ng/L  APTT     Status: None   Collection Time: 03/27/19  1:45 PM  Result Value Ref Range   aPTT 28 24 - 36 seconds  Protime-INR     Status: None   Collection Time: 03/27/19  1:45 PM  Result Value Ref Range   Prothrombin Time 14.0 11.4 - 15.2 seconds   INR 1.1 0.8 - 1.2  Troponin I (High Sensitivity)     Status: Abnormal   Collection Time: 03/27/19  2:12 PM  Result Value Ref Range   Troponin I (High Sensitivity) 44 (H) <18 ng/L   ____________________________________________  EKG My review and personal interpretation at Time: 8:42   Indication: chest pain  Rate: 85  Rhythm: sinus Axis: normal Other: poor  rwave progression, no stemi ____________________________________________  RADIOLOGY  I personally reviewed all radiographic images ordered to evaluate for the above acute complaints and reviewed radiology reports and findings.  These findings were personally discussed with the patient.  Please see medical record for radiology report.  ____________________________________________   PROCEDURES  Procedure(s) performed:  .Critical Care Performed by: Merlyn Lot, MD Authorized by: Merlyn Lot, MD   Critical care provider statement:    Critical care time (minutes):  35   Critical care time was exclusive of:  Separately billable procedures and treating other patients   Critical care was necessary to treat or  prevent  imminent or life-threatening deterioration of the following conditions:  Cardiac failure   Critical care was time spent personally by me on the following activities:  Development of treatment plan with patient or surrogate, discussions with consultants, evaluation of patient's response to treatment, examination of patient, obtaining history from patient or surrogate, ordering and performing treatments and interventions, ordering and review of laboratory studies, ordering and review of radiographic studies, pulse oximetry, re-evaluation of patient's condition and review of old charts      Critical Care performed: yes ____________________________________________   INITIAL IMPRESSION / [place] / ED COURSE  Pertinent labs & imaging results that were available during my care of the patient were reviewed by me and considered in my medical decision making (see chart for details).   DDX: cholelithiasis, cholecystitis, pancreatitis, enteritis, acs, pna,  Brianna Dalton is a 74 y.o. who presents to the ED with symptoms as described above.  Patient uncomfortable appearing.  Does have right upper quadrant tenderness on exam therefore I suspect her pain may be radiating from her gallbladder.  Will order ultrasound.  EKG with some nonspecific changes initial troponin negative.  Also notably hypertensive.  Not having much significant change in discomfort with nitro but will evaluate for cardiac etiology.  Had recent cath that was normal.  Clinical Course as of Mar 27 1534  Fri Mar 27, 2019  1204 Feels some improvement after Toradol but still having some discomfort.  Also complaining of nausea.   [PR]  1238 Neutrophils: 76 [PR]  [id number] Patient's second troponin came back elevated.  She is not complaining of any chest pain right now.  States that over the past week or 2 she has had intermittent chest pain and pressure with diaphoresis but did not mention it earlier.  At this point I do  believe she will require hospitalization for cardiac work-up.  Neurosurgery agrees to follow along regarding her gallbladder.  Have discussed with the patient and available family all diagnostics and treatments performed thus far and all questions were answered to the best of my ability. The patient demonstrates understanding and agreement with plan.    [PR]    Clinical Course User Index [PR] Merlyn Lot, MD    The patient was evaluated in Emergency Department today for the symptoms described in the history of present illness. He/she was evaluated in the context of the global COVID-19 pandemic, which necessitated consideration that the patient might be at risk for infection with the SARS-CoV-2 virus that causes COVID-19. Institutional protocols and algorithms that pertain to the evaluation of patients at risk for COVID-19 are in a state of rapid change based on information released by regulatory bodies including the CDC and federal and state organizations. These policies and algorithms were followed during the patient's care in the ED.  As part of my medical decision making, I reviewed the following data within the [place] notes reviewed and incorporated, Labs reviewed, notes from prior ED visits and [place] Controlled Substance Database   ____________________________________________   FINAL CLINICAL IMPRESSION(S) / ED DIAGNOSES  Final diagnoses:  RUQ pain  Atypical chest pain  Elevated troponin I level  Calculus of gallbladder without cholecystitis without obstruction      NEW MEDICATIONS STARTED DURING THIS VISIT:  New Prescriptions   No medications on file     Note:  This document was prepared using Dragon voice recognition software and may include unintentional dictation errors.    Merlyn Lot, MD 03/27/19 1536

## 2019-03-27 NOTE — ED Triage Notes (Addendum)
Pt to ED via [facility] from home. Pt c/o CP that started at 6:10am upon waking up. Pt stating CP is in right breast and moves central. Pt stating, "It feels like blocks on my chest." Per EMS pt BP 230/120. Pt given 1 spray of nitro. Pt repeat BP 170/100. Pt given 1 more nitro spray. Repeat BP 190/112.  Upon arrival pt c/o right sided chest pain that radiates to central chest. Pt denies radiation to neck or shoulder. Pt c/o dull HA for a few days that was worse this AM. Pt denies hx of falls or recent trauma. Pt states recent laser tx for vein in left leg. Pt states compliant with BP meds.

## 2019-03-27 NOTE — Consult Note (Signed)
Cardiology Consultation Note    Patient ID: CAMAURI PORTE, MRN: [id number], DOB/AGE: October 05, 1945 74 y.o. Admit date: 03/27/2019   Date of Consult: 03/27/2019 Primary Physician: Idelle Crouch, MD Primary Cardiologist: Dr. Nehemiah Massed  Chief Complaint: chest pain Reason for Consultation: abnormal troponin Requesting MD: Dr. Francine Graven  HPI: Brianna Dalton is a 74 y.o. female with history of atypical chest pain with history of cardiac catheterization in March 2019 showing completely normal coronaries.  This was done after presentation to the ER with mild troponin elevation.  She has a history of minimal  carotid artery disease.  Echocardiogram done in June of last year revealed normal LV function with very mild aortic stenosis.  No other significant valvular normalities.  She has a history of hypertension and hyperlipidemia.  She presented to the emergency room after noting right-sided chest pain and right upper quadrant discomfort that started around 7:00 this morning.  It was described as a stabbing pain radiating to her right shoulder.  She has never had pain like this before.  Nitro sublingual showed no improvement.  There was no shortness of breath.  EKG showed sinus rhythm with no ischemia.  Chest x-ray revealed no frank pulmonary edema with some mild congestion.  Right upper quadrant ultrasound revealed cholelithiasis with no gallbladder wall thickening.  No biliary ductal dilatation.  Fatty infiltration of the liver.  Laboratories revealed normal lipase, renal function was normal.  Liver function was normal.  Troponin draw per protocol revealed initial level of 7 with a subsequent level of 39.  Is of note again the patient had normal cardiac cath in March 2019 with normal coronaries.  Echo done less than a year ago showed preserved LV function with very mild aortic valve disease.  No regional wall motion abnormality.  Past Medical History:  Diagnosis Date  . Angina pectoris syndrome ([place])   .  Arterial hemorrhage    Lt ankle possible venous bleed  . Arthritis   . Hypertension   . Hypothyroidism   . Neuropathy   . Varicose vein of leg       Surgical History:  Past Surgical History:  Procedure Laterality Date  . ABDOMINAL HYSTERECTOMY    . BREAST BIOPSY Right 01/19/2013   stereo biopsy of two areas neg  . JOINT REPLACEMENT     lt knee  . KNEE ARTHROPLASTY Right 04/27/2015   Procedure: COMPUTER ASSISTED TOTAL KNEE ARTHROPLASTY;  Surgeon: Dereck Leep, MD;  Location: [facility];  Service: Orthopedics;  Laterality: Right;  . LEFT HEART CATH AND CORONARY ANGIOGRAPHY N/A 04/10/2017   Procedure: LEFT HEART CATH AND CORONARY ANGIOGRAPHY;  Surgeon: Teodoro Spray, MD;  Location: [facility];  Service: Cardiovascular;  Laterality: N/A;  . SHOULDER ARTHROSCOPY Left   . THYROID SURGERY Bilateral   . TUMOR REMOVAL Right    neck     Home Meds: Prior to Admission medications   Medication Sig Start Date End Date Taking? Authorizing Provider  allopurinol (ZYLOPRIM) 300 MG tablet Take 300 mg by mouth daily. 03/11/17   [provider]  aspirin EC 81 MG tablet Take 81 mg by mouth daily.    [provider]  colchicine 0.6 MG tablet Take 0.6 mg by mouth as needed.     [provider]  enalapril (VASOTEC) 20 MG tablet Take 20 mg by mouth daily.    [provider]  hydrochlorothiazide (HYDRODIURIL) 25 MG tablet Take 25 mg by mouth daily. 10/02/18  [provider]  Levothyroxine Sodium 25 MCG CAPS Take 25 mcg by mouth daily before breakfast.     [provider]  metoprolol tartrate (LOPRESSOR) 25 MG tablet Take 12.5 mg by mouth 2 (two) times daily.    [provider]  torsemide (DEMADEX) 10 MG tablet Take 10 mg by mouth daily as needed.    [provider]  traMADol (ULTRAM) 50 MG tablet Take 1-2 tablets (50-100 mg total) by mouth every 4 (four) hours as needed for moderate pain. 04/28/15   Watt Climes, PA     Inpatient Medications:  . allopurinol  300 mg Oral Daily  . aspirin EC  81 mg Oral Daily  . [START ON 03/28/2019] aspirin EC  81 mg Oral Daily  . atorvastatin  40 mg Oral q1800  . enalapril  20 mg Oral Daily  . [START ON 03/28/2019] Levothyroxine Sodium  25 mcg Oral QAC breakfast  . metoprolol tartrate  12.5 mg Oral BID  . torsemide  10 mg Oral Daily   . lactated ringers 100 mL/hr at 03/27/19 1008    Allergies:  Allergies  Allergen Reactions  . Oxycodone-Acetaminophen Hives, Rash, Shortness Of Breath and Swelling  . Aloe Vera Hives  . Amlodipine   . Hydrocodone Hives  . Latex     NEGATIVE BY IgE (<0.10)  . Morphine And Related Other (See Comments)    Nausea delusion   . Penicillins Hives    Has patient had a PCN reaction causing immediate rash, facial/tongue/throat swelling, SOB or lightheadedness with hypotension: Yes Has patient had a PCN reaction causing severe rash involving mucus membranes or skin necrosis: No Has patient had a PCN reaction that required hospitalization: No Has patient had a PCN reaction occurring within the last 10 years: No If all of the above answers are "NO", then may proceed with Cephalosporin use.  . Meperidine Rash    Social History   Socioeconomic History  . Marital status: Divorced    Spouse name: Not on file  . Number of children: Not on file  . Years of education: Not on file  . Highest education level: Not on file  Occupational History  . Not on file  Tobacco Use  . Smoking status: Never Smoker  . Smokeless tobacco: Never Used  Substance and Sexual Activity  . Alcohol use: No  . Drug use: No  . Sexual activity: Never  Other Topics Concern  . Not on file  Social History Narrative  . Not on file   Social Determinants of Health   Financial Resource Strain:   . Difficulty of Paying Living Expenses: Not on file  Food Insecurity:   . Worried About Charity fundraiser in the Last Year: Not on file  . Ran Out of Food in the  Last Year: Not on file  Transportation Needs:   . Lack of Transportation (Medical): Not on file  . Lack of Transportation (Non-Medical): Not on file  Physical Activity:   . Days of Exercise per Week: Not on file  . Minutes of Exercise per Session: Not on file  Stress:   . Feeling of Stress : Not on file  Social Connections:   . Frequency of Communication with Friends and Family: Not on file  . Frequency of Social Gatherings with Friends and Family: Not on file  . Attends Religious Services: Not on file  . Active Member of Clubs or Organizations: Not on file  . Attends Archivist Meetings:  Not on file  . Marital Status: Not on file  Intimate Partner Violence:   . Fear of Current or Ex-Partner: Not on file  . Emotionally Abused: Not on file  . Physically Abused: Not on file  . Sexually Abused: Not on file     No family history on file.   Review of Systems: A 12-system review of systems was performed and is negative except as noted in the HPI.  Labs: No results for input(s): CKTOTAL, CKMB, TROPONINI in the last 72 hours. Lab Results  Component Value Date   WBC 10.8 (H) 03/27/2019   HGB 14.9 03/27/2019   HCT 45.3 03/27/2019   MCV 94.0 03/27/2019   PLT 189 03/27/2019    Recent Labs  Lab 03/27/19 0859  NA 142  K 3.9  CL 104  CO2 28  BUN 15  CREATININE 0.72  CALCIUM 9.1  PROT 7.2  BILITOT 1.1  ALKPHOS 75  ALT 23  AST 33  GLUCOSE 118*   No results found for: CHOL, HDL, LDLCALC, TRIG No results found for: DDIMER  Radiology/Studies:  DG Chest Portable 1 View  Result Date: 03/27/2019 CLINICAL DATA:  Right-sided chest pain. Hypertension. EXAM: PORTABLE CHEST 1 VIEW COMPARISON:  Chest x-ray/26/ FINDINGS: The heart size is normal. Atherosclerotic calcifications are present at the aortic arch. Vascular congestion is present without frank edema. No focal airspace disease is present. IMPRESSION: Mild pulmonary vascular congestion without frank edema.  Electronically Signed   By: San Morelle M.D.   On: 03/27/2019 09:14   VAS [place] LASER ABLATION OF SUPERFICIAL VEI  Result Date: 03/10/2019  Lower Venous Study Indications: Post ablation left.  Performing Technologist: Concha Norway RVT  Examination Guidelines: A complete evaluation includes B-mode imaging, spectral Doppler, color Doppler, and power Doppler as needed of all accessible portions of each vessel. Bilateral testing is considered an integral part of a complete examination. Limited examinations for reoccurring indications may be performed as noted.  +------+---------------+---------+-----------+----------+--------------+ LEFT  CompressibilityPhasicitySpontaneityPropertiesThrombus Aging +------+---------------+---------+-----------+----------+--------------+ CFV   Full           Yes      Yes                                 +------+---------------+---------+-----------+----------+--------------+ SFJ   Full           Yes      Yes                                 +------+---------------+---------+-----------+----------+--------------+ FV MidFull           Yes      Yes                                 +------+---------------+---------+-----------+----------+--------------+ POP   Full           Yes      Yes                                 +------+---------------+---------+-----------+----------+--------------+ GSV   Full                                                        +------+---------------+---------+-----------+----------+--------------+  Summary: Left: The left GSV is closed post ablation from the access point in calf to mid thigh. Partial compression in mid thigh. Multiple varicosities have clotted off as well.  *See table(s) above for measurements and observations. Electronically signed by Leotis Pain MD on 03/10/2019 at 1:28:22 PM.    Final    US ABDOMEN LIMITED RUQ  Result Date: 03/27/2019 CLINICAL DATA:  Right upper quadrant pain EXAM: ULTRASOUND  ABDOMEN LIMITED RIGHT UPPER QUADRANT COMPARISON:  Right upper quadrant pain, chest pain FINDINGS: Gallbladder: Multiple gallstones within the gallbladder, the largest 10 mm. No wall thickening. The patient was tender over the gallbladder during the study. Common bile duct: Diameter: Normal caliber, 5 mm. Liver: Increased echotexture compatible with fatty infiltration. No focal abnormality or biliary ductal dilatation. Portal vein is patent on color Doppler imaging with normal direction of blood flow towards the liver. Other: None. IMPRESSION: Cholelithiasis. There is no gallbladder wall thickening, but the patient was tender over the gallbladder during the study. No biliary ductal dilatation. Fatty infiltration of the liver. Electronically Signed   By: Rolm Baptise M.D.   On: 03/27/2019 09:50    Wt Readings from Last 3 Encounters:  03/27/19 125.6 kg  03/17/19 125.6 kg  03/06/19 125.2 kg    EKG: Normal sinus rhythm with no ischemia  Physical Exam: 74 year old female with right upper quadrant and right shoulder and right chest pain Blood pressure 136/75, pulse (!) 57, temperature 98.2 F (36.8 C), temperature source Oral, resp. rate 14, height 5\' 10"  (1.778 m), weight 125.6 kg, SpO2 97 %. Body mass index is 39.75 kg/m. General: Well developed, well nourished, in no acute distress. Head: Normocephalic, atraumatic, sclera non-icteric, no xanthomas, nares are without discharge.  Neck: Negative for carotid bruits. JVD not elevated. Lungs: Clear bilaterally to auscultation without wheezes, rales, or rhonchi. Breathing is unlabored. Heart: RRR with S1 S2. No murmurs, rubs, or gallops appreciated. Abdomen: Soft, non-tender, non-distended with normoactive bowel sounds. No hepatomegaly. No rebound/guarding. No obvious abdominal masses. Msk:  Strength and tone appear normal for age. Extremities: No clubbing or cyanosis. No edema.  Distal pedal pulses are 2+ and equal bilaterally. Neuro: Alert and oriented  X 3. No facial asymmetry. No focal deficit. Moves all extremities spontaneously. Psych:  Responds to questions appropriately with a normal affect.     Assessment and Plan  74 year old female admitted with right upper quadrant and right chest discomfort.  Has a history of atypical chest pain in the past with cardiac catheterization in March 2019 showing completely normal coronaries.  Echocardiogram done less than a year ago showed normal LV function with no significant regional wall motion abnormality.  She described as pain in her right upper quadrant radiating to her right shoulder.  This was pain different than she has had before.  Initial troponin was 7 with subsequent troponin of 39.  EKG was unremarkable.  Chest x-ray showed no significant pulmonary edema.  Etiology of the pain appears to be choledocholithiasis.  No evidence of acute coronary syndrome especially given the fact her coronaries were normal 2 years ago.  EKG is unremarkable.  Would not treat with heparin.  Patient had an echo less than a year ago with normal LV function.  I do not think the patient needs another echo.  We will continue to treat her choledocholithiasis.  We will follow with you as needed.  Appears stable from a cardiac standpoint for any intervention that may be deemed necessary from her gallbladder.  Signed, Chrissie Noa  A Abagayle Klutts MD 03/27/2019, 1:41 PM Pager: [phone]

## 2019-03-27 NOTE — ED Notes (Addendum)
Date and time results received: 03/27/19 12:12 PM  (use smartphrase ".now" to insert current time)  Test: Troponin Critical Value: 39   Name of Provider Notified: Dr. Quentin Cornwall  Orders Received? Or Actions Taken?: No new orders at this time

## 2019-03-27 NOTE — ED Notes (Signed)
Pt transported to US

## 2019-03-27 NOTE — ED Notes (Signed)
Pt's BP 90/52. This RN at bedside. Pt and BP cuff repositioned. Repeat BP taken 107/59 (74). Pt c/o nausea coming back. MD made aware. No new orders at this time.

## 2019-03-27 NOTE — Plan of Care (Addendum)
VSS. NSR on tele. Intermittent reports of RUQ abdominal pain. Managed w/ prn pain medications. IVF infusing per order. NPO since 0000 for possible surgery. No procedure orders. POC reviewed, questions answered.   Problem: Education: Goal: Knowledge of General Education information will improve Description: Including pain rating scale, medication(s)/side effects and non-pharmacologic comfort measures Outcome: Progressing   Problem: Health Behavior/Discharge Planning: Goal: Ability to manage health-related needs will improve Outcome: Progressing   Problem: Clinical Measurements: Goal: Ability to maintain clinical measurements within normal limits will improve Outcome: Progressing Goal: Will remain free from infection Outcome: Progressing Goal: Diagnostic test results will improve Outcome: Progressing Goal: Respiratory complications will improve Outcome: Progressing Goal: Cardiovascular complication will be avoided Outcome: Progressing   Problem: Activity: Goal: Risk for activity intolerance will decrease Outcome: Progressing   Problem: Nutrition: Goal: Adequate nutrition will be maintained Outcome: Progressing   Problem: Coping: Goal: Level of anxiety will decrease Outcome: Progressing   Problem: Elimination: Goal: Will not experience complications related to bowel motility Outcome: Progressing Goal: Will not experience complications related to urinary retention Outcome: Progressing   Problem: Pain Managment: Goal: General experience of comfort will improve Outcome: Progressing   Problem: Safety: Goal: Ability to remain free from injury will improve Outcome: Progressing   Problem: Skin Integrity: Goal: Risk for impaired skin integrity will decrease Outcome: Progressing

## 2019-03-27 NOTE — Consult Note (Signed)
[facility] SURGICAL CONSULTATION NOTE (initial) - [id number]   HISTORY OF PRESENT ILLNESS (HPI):  74 y.o. female presented to [facility] ED today for evaluation of abdominal pain. Patient reports the acute onset of RUQ abdominal pain. She started to notice a dull pain this morning when she woke up in her RUQ. She took a shower and noticed an acute worsening of the pain. She reports this was a sharp stabbing pain and 10/10 at the worst. She described this as radiating "deep." She noticed associated nausea with the pain as well. She was worried this was her heart and called EMS. She received NTG and morphine which did not immediately relieve the pain. She denied any history of similar pain in the past. No association with food. Work up in the ED revealed very mild leukocytosis to 10.8K, high sensitivity troponin trend was 7 --> 39 and third is pending without any ischemic EKG changes, no LFT elevation, and RUQ US showed cholelithiasis without evidence of cholecystitis.   Surgery is consulted by emergency medicine physician Dr. Merlyn Lot, MD in this context for evaluation and management of cholelithiasis.   PAST MEDICAL HISTORY (PMH):  Past Medical History:  Diagnosis Date  . Angina pectoris syndrome ([place])   . Arterial hemorrhage    Lt ankle possible venous bleed  . Arthritis   . Hypertension   . Hypothyroidism   . Neuropathy   . Varicose vein of leg      PAST SURGICAL HISTORY ([place]):  Past Surgical History:  Procedure Laterality Date  . ABDOMINAL HYSTERECTOMY    . BREAST BIOPSY Right 01/19/2013   stereo biopsy of two areas neg  . JOINT REPLACEMENT     lt knee  . KNEE ARTHROPLASTY Right 04/27/2015   Procedure: COMPUTER ASSISTED TOTAL KNEE ARTHROPLASTY;  Surgeon: Dereck Leep, MD;  Location: [facility] ORS;  Service: Orthopedics;  Laterality: Right;  . LEFT HEART CATH AND CORONARY ANGIOGRAPHY N/A 04/10/2017   Procedure: LEFT HEART CATH AND CORONARY ANGIOGRAPHY;  Surgeon:  Teodoro Spray, MD;  Location: [facility];  Service: Cardiovascular;  Laterality: N/A;  . SHOULDER ARTHROSCOPY Left   . THYROID SURGERY Bilateral   . TUMOR REMOVAL Right    neck     MEDICATIONS:  Prior to Admission medications   Medication Sig Start Date End Date Taking? Authorizing Provider  allopurinol (ZYLOPRIM) 300 MG tablet Take 300 mg by mouth daily. 03/11/17   [provider]  aspirin EC 81 MG tablet Take 81 mg by mouth daily.    [provider]  colchicine 0.6 MG tablet Take 0.6 mg by mouth as needed.     [provider]  enalapril (VASOTEC) 20 MG tablet Take 20 mg by mouth daily.    [provider]  hydrochlorothiazide (HYDRODIURIL) 25 MG tablet Take 25 mg by mouth daily. 10/02/18   [provider]  Levothyroxine Sodium 25 MCG CAPS Take 25 mcg by mouth daily before breakfast.     [provider]  metoprolol tartrate (LOPRESSOR) 25 MG tablet Take 12.5 mg by mouth 2 (two) times daily.    [provider]  torsemide (DEMADEX) 10 MG tablet Take 10 mg by mouth daily as needed.    [provider]  traMADol (ULTRAM) 50 MG tablet Take 1-2 tablets (50-100 mg total) by mouth every 4 (four) hours as needed for moderate pain. 04/28/15   Watt Climes, PA     ALLERGIES:  Allergies  Allergen Reactions  .  Oxycodone-Acetaminophen Hives, Rash, Shortness Of Breath and Swelling  . Aloe Vera Hives  . Amlodipine   . Hydrocodone Hives  . Latex     NEGATIVE BY IgE (<0.10)  . Morphine And Related Other (See Comments)    Nausea delusion   . Penicillins Hives    Has patient had a PCN reaction causing immediate rash, facial/tongue/throat swelling, SOB or lightheadedness with hypotension: Yes Has patient had a PCN reaction causing severe rash involving mucus membranes or skin necrosis: No Has patient had a PCN reaction that required hospitalization: No Has patient had a PCN reaction occurring within the last 10 years:  No If all of the above answers are "NO", then may proceed with Cephalosporin use.  . Meperidine Rash     SOCIAL HISTORY:  Social History   Socioeconomic History  . Marital status: Divorced    Spouse name: Not on file  . Number of children: Not on file  . Years of education: Not on file  . Highest education level: Not on file  Occupational History  . Not on file  Tobacco Use  . Smoking status: Never Smoker  . Smokeless tobacco: Never Used  Substance and Sexual Activity  . Alcohol use: No  . Drug use: No  . Sexual activity: Never  Other Topics Concern  . Not on file  Social History Narrative  . Not on file   Social Determinants of Health   Financial Resource Strain:   . Difficulty of Paying Living Expenses: Not on file  Food Insecurity:   . Worried About Charity fundraiser in the Last Year: Not on file  . Ran Out of Food in the Last Year: Not on file  Transportation Needs:   . Lack of Transportation (Medical): Not on file  . Lack of Transportation (Non-Medical): Not on file  Physical Activity:   . Days of Exercise per Week: Not on file  . Minutes of Exercise per Session: Not on file  Stress:   . Feeling of Stress : Not on file  Social Connections:   . Frequency of Communication with Friends and Family: Not on file  . Frequency of Social Gatherings with Friends and Family: Not on file  . Attends Religious Services: Not on file  . Active Member of Clubs or Organizations: Not on file  . Attends Archivist Meetings: Not on file  . Marital Status: Not on file  Intimate Partner Violence:   . Fear of Current or Ex-Partner: Not on file  . Emotionally Abused: Not on file  . Physically Abused: Not on file  . Sexually Abused: Not on file     FAMILY HISTORY:  No family history on file.    REVIEW OF SYSTEMS:  Review of Systems  Constitutional: Positive for diaphoresis. Negative for chills and fever.  HENT: Negative for congestion and sore throat.    Respiratory: Negative for cough and shortness of breath.   Cardiovascular: Negative for chest pain and palpitations.  Gastrointestinal: Positive for abdominal pain and nausea. Negative for constipation, diarrhea and vomiting.  All other systems reviewed and are negative.   VITAL SIGNS:  Temp:  [98.2 F (36.8 C)] 98.2 F (36.8 C) (03/05 0848) Pulse Rate:  [51-79] 60 (03/05 1200) Resp:  [11-24] 13 (03/05 1200) BP: (107-175)/(59-108) 115/64 (03/05 1200) SpO2:  [95 %-98 %] 95 % (03/05 1200) Weight:  [125.6 kg] 125.6 kg (03/05 0849)     Height: 5\' 10"  (177.8 cm) Weight: 125.6 kg BMI (Calculated):  39.75   INTAKE/OUTPUT:  No intake/output data recorded.  PHYSICAL EXAM:  Physical Exam Vitals and nursing note reviewed.  Constitutional:      Appearance: She is well-developed. She is obese.  HENT:     Head: Normocephalic and atraumatic.  Eyes:     General: No scleral icterus.    Conjunctiva/sclera: Conjunctivae normal.  Cardiovascular:     Rate and Rhythm: Normal rate and regular rhythm.     Pulses: Normal pulses.     Heart sounds: No murmur.  Pulmonary:     Effort: Pulmonary effort is normal. No respiratory distress.     Breath sounds: Normal breath sounds.  Abdominal:     General: Abdomen is protuberant. There is no distension.     Palpations: Abdomen is soft.     Tenderness: There is abdominal tenderness in the right upper quadrant. There is no guarding or rebound. Negative signs include Murphy's sign.     Comments: She is tender to palpation to the RUQ, Negative Murphy's Sign, no rebound or guarding  Genitourinary:    Comments: Deferred Musculoskeletal:        General: Normal range of motion.     Right lower leg: No edema.     Left lower leg: No edema.  Skin:    General: Skin is warm and dry.     Coloration: Skin is not pale.     Findings: No erythema.  Neurological:     General: No focal deficit present.     Mental Status: She is alert and oriented to person, place,  and time.  Psychiatric:        Mood and Affect: Mood normal.        Behavior: Behavior normal.      Labs:  CBC Latest Ref Rng & Units 03/27/2019 06/17/2018 04/10/2017  WBC 4.0 - 10.5 K/uL 10.8(H) 8.7 14.4(H)  Hemoglobin 12.0 - 15.0 g/dL 14.9 13.7 13.5  Hematocrit 36.0 - 46.0 % 45.3 41.8 41.1  Platelets 150 - 400 K/uL 189 252 230   CMP Latest Ref Rng & Units 03/27/2019 06/17/2018 04/09/2017  Glucose 70 - 99 mg/dL 118(H) 165(H) 141(H)  BUN 8 - 23 mg/dL 15 21 10   Creatinine 0.44 - 1.00 mg/dL 0.72 1.14(H) 0.75  Sodium 135 - 145 mmol/L 142 139 138  Potassium 3.5 - 5.1 mmol/L 3.9 3.6 3.6  Chloride 98 - 111 mmol/L 104 105 104  CO2 22 - 32 mmol/L 28 23 23   Calcium 8.9 - 10.3 mg/dL 9.1 8.8(L) 8.9  Total Protein 6.5 - 8.1 g/dL 7.2 6.4(L) -  Total Bilirubin 0.3 - 1.2 mg/dL 1.1 0.2(L) -  Alkaline Phos 38 - 126 U/L 75 83 -  AST 15 - 41 U/L 33 19 -  ALT 0 - 44 U/L 23 13 -     Imaging studies:   RUQ [place] (03/27/2019) personally reviewed showing cholelithiasis without sonographic evidence of cholecystitis, and radiologist report reviewed:  IMPRESSION: Cholelithiasis. There is no gallbladder wall thickening, but the patient was tender over the gallbladder during the study. No biliary ductal dilatation.    Assessment/Plan: (ICD-10's: K2.20) 74 y.o. female with RUQ pain and cholelithiasis on [place] without evidence of cholecysitits, complicated by elevated high-sensitivity troponin which I suspect is likely secondary to stress rather than ACS   - Agree with admission to medicine for ACS rule out  - General surgery will follow for cholelithiasis; agree with cardiology consultation and ACS rule out prior to proceeding with any potential surgical intervention. In  theory, if her pain is improved we could follow up outpatient for elective cholecystectomy. However, I suspect she will want to proceed with cholecystectomy this admission if cleared by cardiology.   - NPO + IVF  - Will hold on IV Abx for  now  - Pain control prn; antiemetics prm  - Monitor abdominal examination   - further management per primary service   - DVT prophylaxis  All of the above findings and recommendations were discussed with the patient, and all of patient's questions were answered to her expressed satisfaction.  Thank you for the opportunity to participate in this patient's care.   -- Edison Simon, PA-C [facility] 03/27/2019, 12:34 PM [phone] M-F: 7am - 4pm

## 2019-03-27 NOTE — H&P (Signed)
History and Physical    Brianna Dalton [id number] DOB: February 28, 1945 DOA: 03/27/2019  PCP: Idelle Crouch, MD   Patient coming from: Home  I have personally briefly reviewed patient's old medical records in [place]  Chief Complaint: Chest pain  HPI: Brianna Dalton is a 74 y.o. female with medical history significant for hypertension, hypothyroidism and gout who presented to the ER for evaluation of right-sided chest discomfort and right upper quadrant discomfort that started around 7 AM this morning when she woke up  She described the pain as a stabbing pain associated with some achiness in her right shoulder.  Does have some nausea but  not any vomiting.  Never had pain like this before.  Denies any history of coronary disease or heart disease.  Had recent heart cath that was normal in 2019. She still has her gallbladder.  Denies any fevers.  She denies shortness of breath, palpitations or diaphoresis. EMS found patient hypertensive she was given nitro spray with no improvement in her symptoms. Still rates the pain is moderate to severe.  ED Course: Patient was seen in the emergency room for evaluation of right upper quadrant and right-sided chest pain.  He had an abdominal ultrasound which showed cholelithiasis.  Patient had a bump in her troponin from 7 >> 39.  Patient was started on a heparin drip in the emergency room and cardiology was consulted  Review of Systems: As per HPI otherwise 10 point review of systems negative.    Past Medical History:  Diagnosis Date  . Angina pectoris syndrome ([place])   . Arterial hemorrhage    Lt ankle possible venous bleed  . Arthritis   . Hypertension   . Hypothyroidism   . Neuropathy   . Varicose vein of leg     Past Surgical History:  Procedure Laterality Date  . ABDOMINAL HYSTERECTOMY    . BREAST BIOPSY Right 01/19/2013   stereo biopsy of two areas neg  . JOINT REPLACEMENT     lt knee  . KNEE ARTHROPLASTY Right 04/27/2015   Procedure: COMPUTER ASSISTED TOTAL KNEE ARTHROPLASTY;  Surgeon: Dereck Leep, MD;  Location: [facility] ORS;  Service: Orthopedics;  Laterality: Right;  . LEFT HEART CATH AND CORONARY ANGIOGRAPHY N/A 04/10/2017   Procedure: LEFT HEART CATH AND CORONARY ANGIOGRAPHY;  Surgeon: Teodoro Spray, MD;  Location: [place] [facility];  Service: Cardiovascular;  Laterality: N/A;  . SHOULDER ARTHROSCOPY Left   . THYROID SURGERY Bilateral   . TUMOR REMOVAL Right    neck     reports that she has never smoked. She has never used smokeless tobacco. She reports that she does not drink alcohol or use drugs.  Allergies  Allergen Reactions  . Oxycodone-Acetaminophen Hives, Rash, Shortness Of Breath and Swelling  . Aloe Vera Hives  . Amlodipine   . Hydrocodone Hives  . Latex     NEGATIVE BY IgE (<0.10)  . Morphine And Related Other (See Comments)    Nausea delusion   . Penicillins Hives    Has patient had a PCN reaction causing immediate rash, facial/tongue/throat swelling, SOB or lightheadedness with hypotension: Yes Has patient had a PCN reaction causing severe rash involving mucus membranes or skin necrosis: No Has patient had a PCN reaction that required hospitalization: No Has patient had a PCN reaction occurring within the last 10 years: No If all of the above answers are "NO", then may proceed with Cephalosporin use.  . Meperidine Rash  No family history on file.   Prior to Admission medications   Medication Sig Start Date End Date Taking? Authorizing Provider  allopurinol (ZYLOPRIM) 300 MG tablet Take 300 mg by mouth daily. 03/11/17   [provider]  aspirin EC 81 MG tablet Take 81 mg by mouth daily.    [provider]  colchicine 0.6 MG tablet Take 0.6 mg by mouth as needed.     [provider]  enalapril (VASOTEC) 20 MG tablet Take 20 mg by mouth daily.    [provider]  hydrochlorothiazide (HYDRODIURIL) 25 MG tablet Take 25 mg by mouth daily.  10/02/18   [provider]  Levothyroxine Sodium 25 MCG CAPS Take 25 mcg by mouth daily before breakfast.     [provider]  metoprolol tartrate (LOPRESSOR) 25 MG tablet Take 12.5 mg by mouth 2 (two) times daily.    [provider]  torsemide (DEMADEX) 10 MG tablet Take 10 mg by mouth daily as needed.    [provider]  traMADol (ULTRAM) 50 MG tablet Take 1-2 tablets (50-100 mg total) by mouth every 4 (four) hours as needed for moderate pain. 04/28/15   Watt Climes, PA    Physical Exam: Vitals:   03/27/19 1141 03/27/19 1200 03/27/19 1230 03/27/19 1300  BP: (!) 107/59 115/64 128/73 136/75  Pulse: 62 60 68 (!) 57  Resp: (!) 24 13 (!) 21 14  Temp:      TempSrc:      SpO2: 96% 95% 97% 97%  Weight:      Height:         Vitals:   03/27/19 1141 03/27/19 1200 03/27/19 1230 03/27/19 1300  BP: (!) 107/59 115/64 128/73 136/75  Pulse: 62 60 68 (!) 57  Resp: (!) 24 13 (!) 21 14  Temp:      TempSrc:      SpO2: 96% 95% 97% 97%  Weight:      Height:        Constitutional: NAD, alert and oriented to person, place and time, appears comfortable and in no obvious distress Eyes: PERRL, lids and conjunctivae normal ENMT: Mucous membranes are moist.  Neck: normal, supple, no masses, no thyromegaly Respiratory: clear to auscultation bilaterally, no wheezing, no crackles. Normal respiratory effort. No accessory muscle use.  Cardiovascular: Regular rate and rhythm, no murmurs / rubs / gallops. No extremity edema. 2+ pedal pulses. No carotid bruits.  Abdomen: tenderness in the RUQ, no masses palpated. No hepatosplenomegaly. Bowel sounds positive.  Musculoskeletal: no clubbing / cyanosis. No joint deformity upper and lower extremities.  Skin: no rashes, lesions, ulcers.  Neurologic: No gross focal neurologic deficit. Psychiatric: Normal mood and affect.   Labs on Admission: I have personally reviewed following labs and imaging studies  CBC: Recent Labs  Lab  03/27/19 0859  WBC 10.8*  NEUTROABS 8.3*  HGB 14.9  HCT 45.3  MCV 94.0  PLT 99991111   Basic Metabolic Panel: Recent Labs  Lab 03/27/19 0859  NA 142  K 3.9  CL 104  CO2 28  GLUCOSE 118*  BUN 15  CREATININE 0.72  CALCIUM 9.1   GFR: Estimated Creatinine Clearance: 91.6 mL/min (by C-G formula based on SCr of 0.72 mg/dL). Liver Function Tests: Recent Labs  Lab 03/27/19 0859  AST 33  ALT 23  ALKPHOS 75  BILITOT 1.1  PROT 7.2  ALBUMIN 4.2   Recent Labs  Lab 03/27/19 0859  LIPASE 31   No results for input(s):  AMMONIA in the last 168 hours. Coagulation Profile: No results for input(s): INR, PROTIME in the last 168 hours. Cardiac Enzymes: No results for input(s): CKTOTAL, CKMB, CKMBINDEX, TROPONINI in the last 168 hours. BNP (last 3 results) No results for input(s): PROBNP in the last 8760 hours. HbA1C: No results for input(s): HGBA1C in the last 72 hours. CBG: No results for input(s): GLUCAP in the last 168 hours. Lipid Profile: No results for input(s): CHOL, HDL, LDLCALC, TRIG, CHOLHDL, LDLDIRECT in the last 72 hours. Thyroid Function Tests: No results for input(s): TSH, T4TOTAL, FREET4, T3FREE, THYROIDAB in the last 72 hours. Anemia Panel: No results for input(s): VITAMINB12, FOLATE, FERRITIN, TIBC, IRON, RETICCTPCT in the last 72 hours. Urine analysis:    Component Value Date/Time   COLORURINE AMBER (A) 06/17/2018 1552   APPEARANCEUR CLOUDY (A) 06/17/2018 1552   APPEARANCEUR Clear 07/02/2013 1409   LABSPEC 1.030 06/17/2018 1552   LABSPEC 1.011 07/02/2013 1409   PHURINE 5.0 06/17/2018 1552   GLUCOSEU NEGATIVE 06/17/2018 1552   GLUCOSEU Negative 07/02/2013 1409   HGBUR NEGATIVE 06/17/2018 1552   BILIRUBINUR SMALL (A) 06/17/2018 1552   BILIRUBINUR Negative 07/02/2013 1409   KETONESUR NEGATIVE 06/17/2018 1552   PROTEINUR >=300 (A) 06/17/2018 1552   NITRITE NEGATIVE 06/17/2018 1552   LEUKOCYTESUR NEGATIVE 06/17/2018 1552   LEUKOCYTESUR Negative 07/02/2013  1409    Radiological Exams on Admission: DG Chest Portable 1 View  Result Date: 03/27/2019 CLINICAL DATA:  Right-sided chest pain. Hypertension. EXAM: PORTABLE CHEST 1 VIEW COMPARISON:  Chest x-ray/26/ FINDINGS: The heart size is normal. Atherosclerotic calcifications are present at the aortic arch. Vascular congestion is present without frank edema. No focal airspace disease is present. IMPRESSION: Mild pulmonary vascular congestion without frank edema. Electronically Signed   By: San Morelle M.D.   On: 03/27/2019 09:14   US ABDOMEN LIMITED RUQ  Result Date: 03/27/2019 CLINICAL DATA:  Right upper quadrant pain EXAM: ULTRASOUND ABDOMEN LIMITED RIGHT UPPER QUADRANT COMPARISON:  Right upper quadrant pain, chest pain FINDINGS: Gallbladder: Multiple gallstones within the gallbladder, the largest 10 mm. No wall thickening. The patient was tender over the gallbladder during the study. Common bile duct: Diameter: Normal caliber, 5 mm. Liver: Increased echotexture compatible with fatty infiltration. No focal abnormality or biliary ductal dilatation. Portal vein is patent on color Doppler imaging with normal direction of blood flow towards the liver. Other: None. IMPRESSION: Cholelithiasis. There is no gallbladder wall thickening, but the patient was tender over the gallbladder during the study. No biliary ductal dilatation. Fatty infiltration of the liver. Electronically Signed   By: Rolm Baptise M.D.   On: 03/27/2019 09:50    EKG: Independently reviewed.  Sinus rhythm.  Minimal ST depression in the lateral leads  Assessment/Plan Principal Problem:   Elevated troponin Active Problems:   Adult hypothyroidism   Morbid obesity (HCC)   Cholelithiasis     Chest pain Right-sided with no relief following administration of nitroglycerin She had a bump in her troponin from 7 >> 39 >> 44 Twelve-lead EKG shows minimal ST changes in the lateral leads Discussed with cardiology who thinks the chest  pain may be secondary to demand ischemia rather than acute coronary syndrome since patient was found to have normal coronaries following a cardiac cath in 2019 Recommend to hold heparin for now Continue aspirin, beta-blockers and statins Obtain serial troponin levels and EKG   Hypertension Continue Toprol and enalapril   Hypothyroidism Continue Synthroid   Morbid obesity (BMI [id number]) Complicates overall prognosis and care  Cholelithiasis Symptomatic Will request surgical consult for further evaluation  DVT prophylaxis:  SCD Code Status: Full code Family Communication: Plan of care was discussed with patient who verbalizes understanding and agrees with the plan Disposition Plan: Back to previous home environment Consults called: Surgery, Cardiology    Glori Machnik MD [facility]     03/27/2019, 1:42 PM

## 2019-03-27 NOTE — Anesthesia Preprocedure Evaluation (Addendum)
Anesthesia Evaluation  Patient identified by MRN, date of birth, ID band Patient awake    Reviewed: Allergy & Precautions, H&P , NPO status , Patient's Chart, lab work & pertinent test results  Airway Mallampati: III  TM Distance: >3 FB Neck ROM: limited    Dental  (+) Upper Dentures, Poor Dentition, Chipped, Missing   Pulmonary neg pulmonary ROS,    Pulmonary exam normal        Cardiovascular hypertension, + angina Normal cardiovascular exam  Admitted with chest pain, slightly elevated troponin, thought secondary to demand ischemia.  Normal LHC in 2019. No SWMA on ECHO last year.  Per Cardiology, no further cardiac eval indicated, ok to proceed with surgery   Neuro/Psych  Neuromuscular disease negative psych ROS   GI/Hepatic Neg liver ROS, hiatal hernia, GERD  Controlled,  Endo/Other  Hypothyroidism Morbid obesity  Renal/GU Renal disease     Musculoskeletal  (+) Arthritis ,   Abdominal   Peds  Hematology negative hematology ROS (+)   Anesthesia Other Findings Past Medical History: No date: Angina pectoris syndrome ([place]) No date: Arterial hemorrhage     Comment:  Lt ankle possible venous bleed No date: Arthritis No date: Hypertension No date: Hypothyroidism No date: Neuropathy No date: Varicose vein of leg  Past Surgical History: No date: ABDOMINAL HYSTERECTOMY 01/19/2013: BREAST BIOPSY; Right     Comment:  stereo biopsy of two areas neg No date: JOINT REPLACEMENT     Comment:  lt knee 04/27/2015: KNEE ARTHROPLASTY; Right     Comment:  Procedure: COMPUTER ASSISTED TOTAL KNEE ARTHROPLASTY;                Surgeon: Dereck Leep, MD;  Location: [facility] ORS;                Service: Orthopedics;  Laterality: Right; 04/10/2017: LEFT HEART CATH AND CORONARY ANGIOGRAPHY; N/A     Comment:  Procedure: LEFT HEART CATH AND CORONARY ANGIOGRAPHY;                Surgeon: Teodoro Spray, MD;  Location: [place] [facility];  Service: Cardiovascular;  Laterality: N/A; No date: SHOULDER ARTHROSCOPY; Left No date: THYROID SURGERY; Bilateral No date: TUMOR REMOVAL; Right     Comment:  neck  BMI    Body Mass Index: 39.75 kg/m      Reproductive/Obstetrics negative OB ROS                          Anesthesia Physical Anesthesia Plan  ASA: III and emergent  Anesthesia Plan: General ETT   Post-op Pain Management:    Induction:   PONV Risk Score and Plan: Ondansetron, Dexamethasone, Midazolam and Treatment may vary due to age or medical condition  Airway Management Planned:   Additional Equipment:   Intra-op Plan:   Post-operative Plan:   Informed Consent: I have reviewed the patients History and Physical, chart, labs and discussed the procedure including the risks, benefits and alternatives for the proposed anesthesia with the patient or authorized representative who has indicated his/her understanding and acceptance.     Dental Advisory Given  Plan Discussed with:   Anesthesia Plan Comments:        Anesthesia Quick Evaluation

## 2019-03-27 NOTE — ED Notes (Signed)
Admitting provider at bedside.

## 2019-03-27 NOTE — Progress Notes (Signed)
[place] for heparin Indication: chest pain/ACS  Allergies  Allergen Reactions  . Oxycodone-Acetaminophen Hives, Rash, Shortness Of Breath and Swelling  . Aloe Vera Hives  . Amlodipine   . Hydrocodone Hives  . Latex     NEGATIVE BY IgE (<0.10)  . Morphine And Related Other (See Comments)    Nausea delusion   . Penicillins Hives    Has patient had a PCN reaction causing immediate rash, facial/tongue/throat swelling, SOB or lightheadedness with hypotension: Yes Has patient had a PCN reaction causing severe rash involving mucus membranes or skin necrosis: No Has patient had a PCN reaction that required hospitalization: No Has patient had a PCN reaction occurring within the last 10 years: No If all of the above answers are "NO", then may proceed with Cephalosporin use.  . Meperidine Rash    Patient Measurements: Height: 5\' 10"  (177.8 cm) Weight: 277 lb (125.6 kg) IBW/kg (Calculated) : 68.5 Heparin Dosing Weight: 97 kg  Vital Signs: Temp: 98.2 F (36.8 C) (03/05 0848) Temp Source: Oral (03/05 0848) BP: 128/73 (03/05 1230) Pulse Rate: 68 (03/05 1230)  Labs: Recent Labs    03/27/19 0859 03/27/19 1114  HGB 14.9  --   HCT 45.3  --   PLT 189  --   CREATININE 0.72  --   TROPONINIHS 7 39*    Estimated Creatinine Clearance: 91.6 mL/min (by C-G formula based on SCr of 0.72 mg/dL).   Medical History: Past Medical History:  Diagnosis Date  . Angina pectoris syndrome ([place])   . Arterial hemorrhage    Lt ankle possible venous bleed  . Arthritis   . Hypertension   . Hypothyroidism   . Neuropathy   . Varicose vein of leg     Assessment: 74 year old female presented with chest pain. Initial troponin 39. Pharmacy consulted for heparin drip. No prior anticoagulation listed in PTA meds or outside med fill history.  Goal of Therapy:  Heparin level 0.3-0.7 units/ml Monitor platelets by anticoagulation protocol: Yes   Plan:  Heparin  4000 unit bolus followed by heparin drip at 1200 units/hr. HL at 2200. CBC daily while on heparin drip.  Tawnya Crook, PharmD 03/27/2019,1:07 PM

## 2019-03-27 NOTE — ED Notes (Signed)
X-ray at bedside

## 2019-03-28 ENCOUNTER — Encounter: Payer: Self-pay | Admitting: Internal Medicine

## 2019-03-28 ENCOUNTER — Inpatient Hospital Stay: Payer: Medicare HMO | Admitting: Certified Registered"

## 2019-03-28 ENCOUNTER — Encounter
Admission: EM | Disposition: A | Discharge status: Home / Self Care | Payer: Self-pay | Source: Home / Self Care | Attending: Internal Medicine

## 2019-03-28 DIAGNOSIS — E039 Hypothyroidism, unspecified: Secondary | ICD-10-CM

## 2019-03-28 DIAGNOSIS — R778 Other specified abnormalities of plasma proteins: Secondary | ICD-10-CM

## 2019-03-28 HISTORY — PX: CHOLECYSTECTOMY: SHX55

## 2019-03-28 LAB — CBC
HCT: 43.5 % (ref 36.0–46.0)
Hemoglobin: 13.9 g/dL (ref 12.0–15.0)
MCH: 31 pg (ref 26.0–34.0)
MCHC: 32 g/dL (ref 30.0–36.0)
MCV: 96.9 fL (ref 80.0–100.0)
Platelets: 172 10*3/uL (ref 150–400)
RBC: 4.49 MIL/uL (ref 3.87–5.11)
RDW: 13.4 % (ref 11.5–15.5)
WBC: 6.8 10*3/uL (ref 4.0–10.5)
nRBC: 0 % (ref 0.0–0.2)

## 2019-03-28 LAB — BASIC METABOLIC PANEL
Anion gap: 11 (ref 5–15)
BUN: 12 mg/dL (ref 8–23)
CO2: 29 mmol/L (ref 22–32)
Calcium: 9 mg/dL (ref 8.9–10.3)
Chloride: 101 mmol/L (ref 98–111)
Creatinine, Ser: 0.59 mg/dL (ref 0.44–1.00)
GFR calc Af Amer: >60 mL/min (ref >60)
GFR calc non Af Amer: >60 mL/min (ref >60)
Glucose, Bld: 90 mg/dL (ref 70–99)
Potassium: 3.9 mmol/L (ref 3.5–5.1)
Sodium: 141 mmol/L (ref 135–145)

## 2019-03-28 SURGERY — LAPAROSCOPIC CHOLECYSTECTOMY
Anesthesia: General

## 2019-03-28 MED ORDER — ONDANSETRON HCL 4 MG/2ML IJ SOLN
INTRAMUSCULAR | Status: AC
  Filled 2019-03-28: qty 2

## 2019-03-28 MED ORDER — CIPROFLOXACIN IN D5W 400 MG/200ML IV SOLN
400.0000 mg | INTRAVENOUS | Status: AC
  Administered 2019-03-28: 400 mg via INTRAVENOUS
  Filled 2019-03-28: qty 200

## 2019-03-28 MED ORDER — ONDANSETRON HCL 4 MG/2ML IJ SOLN
4.0000 mg | Freq: Once | INTRAMUSCULAR | Status: AC | PRN
  Administered 2019-03-28: 4 mg via INTRAVENOUS

## 2019-03-28 MED ORDER — LIDOCAINE HCL (PF) 2 % IJ SOLN
INTRAMUSCULAR | Status: AC
  Filled 2019-03-28: qty 5

## 2019-03-28 MED ORDER — OXYMETAZOLINE HCL 0.05 % NA SOLN
NASAL | Status: AC
  Filled 2019-03-28: qty 30

## 2019-03-28 MED ORDER — PROPOFOL 10 MG/ML IV BOLUS
INTRAVENOUS | Status: AC
  Filled 2019-03-28: qty 20

## 2019-03-28 MED ORDER — ROCURONIUM BROMIDE 100 MG/10ML IV SOLN
INTRAVENOUS | Status: DC | PRN
  Administered 2019-03-28: 30 mg via INTRAVENOUS
  Administered 2019-03-28: 50 mg via INTRAVENOUS

## 2019-03-28 MED ORDER — CIPROFLOXACIN IN D5W 400 MG/200ML IV SOLN
400.0000 mg | Freq: Two times a day (BID) | INTRAVENOUS | Status: DC
  Administered 2019-03-29: 400 mg via INTRAVENOUS
  Filled 2019-03-28 (×4): qty 200

## 2019-03-28 MED ORDER — HYDROMORPHONE HCL 1 MG/ML IJ SOLN
0.5000 mg | INTRAMUSCULAR | Status: DC | PRN
  Administered 2019-03-28: 0.5 mg via INTRAVENOUS
  Filled 2019-03-28: qty 1

## 2019-03-28 MED ORDER — ARTIFICIAL TEARS OPHTHALMIC OINT
TOPICAL_OINTMENT | OPHTHALMIC | Status: AC
  Filled 2019-03-28: qty 3.5

## 2019-03-28 MED ORDER — PROPOFOL 10 MG/ML IV BOLUS
INTRAVENOUS | Status: DC | PRN
  Administered 2019-03-28: 150 mg via INTRAVENOUS

## 2019-03-28 MED ORDER — ROCURONIUM BROMIDE 50 MG/5ML IV SOLN
INTRAVENOUS | Status: AC
  Filled 2019-03-28: qty 1

## 2019-03-28 MED ORDER — FENTANYL CITRATE (PF) 100 MCG/2ML IJ SOLN
INTRAMUSCULAR | Status: AC
  Filled 2019-03-28: qty 2

## 2019-03-28 MED ORDER — DEXAMETHASONE SODIUM PHOSPHATE 10 MG/ML IJ SOLN
INTRAMUSCULAR | Status: AC
  Filled 2019-03-28: qty 1

## 2019-03-28 MED ORDER — ACETAMINOPHEN 10 MG/ML IV SOLN
INTRAVENOUS | Status: AC
  Filled 2019-03-28: qty 100

## 2019-03-28 MED ORDER — ACETAMINOPHEN 10 MG/ML IV SOLN
INTRAVENOUS | Status: DC | PRN
  Administered 2019-03-28: 1000 mg via INTRAVENOUS

## 2019-03-28 MED ORDER — SEVOFLURANE IN SOLN
RESPIRATORY_TRACT | Status: AC
  Filled 2019-03-28: qty 250

## 2019-03-28 MED ORDER — IBUPROFEN 200 MG PO TABS: 200.0000 mg | ORAL_TABLET | Freq: Four times a day (QID) | ORAL | Status: DC | PRN

## 2019-03-28 MED ORDER — SUGAMMADEX SODIUM 200 MG/2ML IV SOLN
INTRAVENOUS | Status: DC | PRN
  Administered 2019-03-28: 200 mg via INTRAVENOUS

## 2019-03-28 MED ORDER — KETOROLAC TROMETHAMINE 30 MG/ML IJ SOLN
INTRAMUSCULAR | Status: AC
  Filled 2019-03-28: qty 1

## 2019-03-28 MED ORDER — SUGAMMADEX SODIUM 200 MG/2ML IV SOLN
INTRAVENOUS | Status: AC
  Filled 2019-03-28: qty 2

## 2019-03-28 MED ORDER — FENTANYL CITRATE (PF) 100 MCG/2ML IJ SOLN
INTRAMUSCULAR | Status: DC | PRN
  Administered 2019-03-28: 25 ug via INTRAVENOUS
  Administered 2019-03-28: 50 ug via INTRAVENOUS
  Administered 2019-03-28: 75 ug via INTRAVENOUS

## 2019-03-28 MED ORDER — EPHEDRINE SULFATE 50 MG/ML IJ SOLN
INTRAMUSCULAR | Status: DC | PRN
  Administered 2019-03-28: 10 mg via INTRAVENOUS
  Administered 2019-03-28: 7.5 mg via INTRAVENOUS

## 2019-03-28 MED ORDER — KETOROLAC TROMETHAMINE 15 MG/ML IJ SOLN
15.0000 mg | Freq: Four times a day (QID) | INTRAMUSCULAR | Status: DC
  Administered 2019-03-29: 15 mg via INTRAVENOUS
  Filled 2019-03-28 (×2): qty 1

## 2019-03-28 MED ORDER — IBUPROFEN 100 MG/5ML PO SUSP: 200.0000 mg | Freq: Four times a day (QID) | ORAL | Status: DC | PRN

## 2019-03-28 MED ORDER — ONDANSETRON HCL 4 MG/2ML IJ SOLN
INTRAMUSCULAR | Status: DC | PRN
  Administered 2019-03-28: 4 mg via INTRAVENOUS

## 2019-03-28 MED ORDER — SODIUM BICARBONATE 8.4 % IV SOLN
INTRAVENOUS | Status: AC
  Filled 2019-03-28: qty 50

## 2019-03-28 MED ORDER — LIDOCAINE HCL (CARDIAC) PF 100 MG/5ML IV SOSY
PREFILLED_SYRINGE | INTRAVENOUS | Status: DC | PRN
  Administered 2019-03-28: 100 mg via INTRAVENOUS

## 2019-03-28 MED ORDER — BUPIVACAINE-EPINEPHRINE (PF) 0.25% -1:200000 IJ SOLN
INTRAMUSCULAR | Status: DC | PRN
  Administered 2019-03-28: 30 mL via PERINEURAL

## 2019-03-28 MED ORDER — FENTANYL CITRATE (PF) 100 MCG/2ML IJ SOLN: 25.0000 ug | INTRAMUSCULAR | Status: DC | PRN

## 2019-03-28 MED ORDER — DEXAMETHASONE SODIUM PHOSPHATE 10 MG/ML IJ SOLN
INTRAMUSCULAR | Status: DC | PRN
  Administered 2019-03-28: 10 mg via INTRAVENOUS

## 2019-03-28 SURGICAL SUPPLY — 47 items
APPLICATOR ARISTA FLEXITIP XL (MISCELLANEOUS) ×2 IMPLANT
APPLIER CLIP 5 13 M/L LIGAMAX5 (MISCELLANEOUS) ×2
BLADE SURG 15 STRL LF DISP TIS (BLADE) ×1 IMPLANT
BLADE SURG 15 STRL SS (BLADE) ×1
CANISTER SUCT 1200ML W/VALVE (MISCELLANEOUS) ×2 IMPLANT
CATH CHOLANGI 4FR 420404F (CATHETERS) IMPLANT
CHLORAPREP W/TINT 26 (MISCELLANEOUS) ×2 IMPLANT
CLIP APPLIE 5 13 M/L LIGAMAX5 (MISCELLANEOUS) ×1 IMPLANT
CONRAY 60ML FOR OR (MISCELLANEOUS) ×2 IMPLANT
COVER WAND RF STERILE (DRAPES) ×2 IMPLANT
DERMABOND ADVANCED (GAUZE/BANDAGES/DRESSINGS) ×1
DERMABOND ADVANCED .7 DNX12 (GAUZE/BANDAGES/DRESSINGS) ×1 IMPLANT
DRAPE C-ARM XRAY 36X54 (DRAPES) IMPLANT
ELECT CAUTERY BLADE TIP 2.5 (TIP) ×2
ELECT REM PT RETURN 9FT ADLT (ELECTROSURGICAL) ×2
ELECTRODE CAUTERY BLDE TIP 2.5 (TIP) ×1 IMPLANT
ELECTRODE REM PT RTRN 9FT ADLT (ELECTROSURGICAL) ×1 IMPLANT
GLOVE SURG SYN 7.0 (GLOVE) ×2 IMPLANT
GLOVE SURG SYN 7.5  E (GLOVE) ×1
GLOVE SURG SYN 7.5 E (GLOVE) ×1 IMPLANT
GOWN STRL REUS W/ TWL LRG LVL3 (GOWN DISPOSABLE) ×3 IMPLANT
GOWN STRL REUS W/TWL LRG LVL3 (GOWN DISPOSABLE) ×3
HEMOSTAT ARISTA ABSORB 1G (HEMOSTASIS) ×2 IMPLANT
IRRIGATION STRYKERFLOW (MISCELLANEOUS) IMPLANT
IRRIGATOR STRYKERFLOW (MISCELLANEOUS)
IV CATH ANGIO 12GX3 LT BLUE (NEEDLE) ×2 IMPLANT
IV NS 1000ML (IV SOLUTION)
IV NS 1000ML BAXH (IV SOLUTION) IMPLANT
JACKSON PRATT 10 (INSTRUMENTS) IMPLANT
L-HOOK LAP DISP 36CM (ELECTROSURGICAL) ×2
LABEL OR SOLS (LABEL) ×2 IMPLANT
LHOOK LAP DISP 36CM (ELECTROSURGICAL) ×1 IMPLANT
NEEDLE HYPO 22GX1.5 SAFETY (NEEDLE) ×4 IMPLANT
PACK LAP CHOLECYSTECTOMY (MISCELLANEOUS) ×2 IMPLANT
PENCIL ELECTRO HAND CTR (MISCELLANEOUS) ×2 IMPLANT
POUCH SPECIMEN RETRIEVAL 10MM (ENDOMECHANICALS) ×2 IMPLANT
SCISSORS METZENBAUM CVD 33 (INSTRUMENTS) ×2 IMPLANT
SET TUBE SMOKE EVAC HIGH FLOW (TUBING) ×2 IMPLANT
SLEEVE ADV FIXATION 5X100MM (TROCAR) ×6 IMPLANT
SPONGE VERSALON 4X4 4PLY (MISCELLANEOUS) IMPLANT
SUT MNCRL 4-0 (SUTURE) ×1
SUT MNCRL 4-0 27XMFL (SUTURE) ×1
SUT VICRYL 0 AB UR-6 (SUTURE) ×2 IMPLANT
SUTURE MNCRL 4-0 27XMF (SUTURE) ×1 IMPLANT
SYS KII FIOS ACCESS ABD 5X100 (TROCAR) ×2
SYSTEM KII FIOS ACES ABD 5X100 (TROCAR) ×1 IMPLANT
TROCAR BALLN GELPORT 12X130M (ENDOMECHANICALS) ×2 IMPLANT

## 2019-03-28 NOTE — Op Note (Signed)
  Procedure Date:  03/28/2019  Pre-operative Diagnosis:  Symptomatic cholelithiasis  Post-operative Diagnosis:  Acute cholecystitis  Procedure:  Laparoscopic cholecystectomy  Surgeon:  Melvyn Neth, MD  Anesthesia:  General endotracheal  Estimated Blood Loss:  30 ml  Specimens:  gallbladder  Complications:  None  Indications for Procedure:  This is a 74 y.o. female who presents with abdominal pain and workup revealing symptomatic cholelithiasis.  The benefits, complications, treatment options, and expected outcomes were discussed with the patient. The risks of bleeding, infection, recurrence of symptoms, failure to resolve symptoms, bile duct damage, bile duct leak, retained common bile duct stone, bowel injury, and need for further procedures were all discussed with the patient and she was willing to proceed.  Description of Procedure: The patient was correctly identified in the preoperative area and brought into the operating room.  The patient was placed supine with VTE prophylaxis in place.  Appropriate time-outs were performed.  Anesthesia was induced and the patient was intubated.  Appropriate antibiotics were infused.  The abdomen was prepped and draped in a sterile fashion. An infraumbilical incision was made. A cutdown technique was used to enter the abdominal cavity without injury, and a Hasson trocar was inserted.  Pneumoperitoneum was obtained with appropriate opening pressures.  A 5-mm port was placed in the subxiphoid area and two 5-mm ports were placed in the right upper quadrant under direct visualization.  The gallbladder was identified.  It was very distended and needed needle decompression prior to beginning. The fundus was grasped and retracted cephalad.  Adhesions from omentum to the gallbladder were lysed bluntly and with electrocautery. The infundibulum was grasped and retracted laterally, exposing the peritoneum overlying the gallbladder.  This was incised with  electrocautery and extended on either side of the gallbladder.  The cystic duct and cystic artery were clearly identified and bluntly dissected.  Both were clipped twice proximally and once distally, cutting in between.  The gallbladder was taken from the gallbladder fossa in a retrograde fashion with electrocautery. The gallbladder was placed in an Endocatch bag. The liver bed was inspected and any bleeding was controlled with electrocautery. The right upper quadrant was then inspected again revealing intact clips, no bleeding, and no ductal injury.  The area was thoroughly irrigated.  3 gm of Arista powder was sprayed over the liver bed for further hemostasis.  The 5 mm ports were removed under direct visualization and the Hasson trocar was removed.  The Endocatch bag was brought out via the umbilical incision. The fascial opening was closed using 0 vicryl suture.  Local anesthetic was infused in all incisions and the incisions were closed with 3-0 Vicryl and 4-0 Monocryl.  The wounds were cleaned and sealed with DermaBond.  The patient was emerged from anesthesia and extubated and brought to the recovery room for further management.  The patient tolerated the procedure well and all counts were correct at the end of the case.   Melvyn Neth, MD

## 2019-03-28 NOTE — Progress Notes (Signed)
Patient Name: Brianna Dalton Date of Encounter: 03/28/2019  Hospital Problem List     Principal Problem:   Elevated troponin Active Problems:   Adult hypothyroidism   Morbid obesity [facility])   Cholelithiasis    Patient Profile     74 y.o. female with history of atypical chest pain with history of cardiac catheterization in March 2019 showing completely normal coronaries.  Admitted with right-sided chest pain right flank pain noted to have gallstones.  Minimal troponin elevation secondary due to demand.  No evidence of acute coronary syndrome.  Subjective   Right upper quadrant abdominal pain  Inpatient Medications    . allopurinol  300 mg Oral Daily  . aspirin EC  81 mg Oral Daily  . atorvastatin  40 mg Oral q1800  . enalapril  20 mg Oral Daily  . levothyroxine  25 mcg Oral QAC breakfast  . metoprolol tartrate  12.5 mg Oral BID  . torsemide  10 mg Oral Daily    Vital Signs    Vitals:   03/27/19 1646 03/27/19 2045 03/28/19 0507 03/28/19 0807  BP: (!) 146/89 (!) 157/82 (!) 149/72 136/90  Pulse: 61 63 65 79  Resp: 20 18 18    Temp: 98.1 F (36.7 C) 98 F (36.7 C) 98.2 F (36.8 C) 98.3 F (36.8 C)  TempSrc: Oral Oral Oral Oral  SpO2: 97% 99%  94%  Weight:   127.1 kg   Height:        Intake/Output Summary (Last 24 hours) at 03/28/2019 1031 Last data filed at 03/28/2019 0807 Gross per 24 hour  Intake 1529.79 ml  Output 600 ml  Net 929.79 ml   Filed Weights   03/27/19 0849 03/28/19 0507  Weight: 125.6 kg 127.1 kg    Physical Exam    GEN: Well nourished, well developed, in no acute distress.  HEENT: normal.  Neck: Supple, no JVD, carotid bruits, or masses. Cardiac: RRR, no murmurs, rubs, or gallops. No clubbing, cyanosis, edema.  Radials/DP/PT 2+ and equal bilaterally.  Respiratory:  Respirations regular and unlabored, clear to auscultation bilaterally. GI: Soft, nontender, nondistended, BS + x 4. MS: no deformity or atrophy. Skin: warm and dry, no  rash. Neuro:  Strength and sensation are intact. Psych: Normal affect.  Labs    CBC Recent Labs    03/27/19 0859 03/28/19 0610  WBC 10.8* 6.8  NEUTROABS 8.3*  --   HGB 14.9 13.9  HCT 45.3 43.5  MCV 94.0 96.9  PLT 189 [id number]   Basic Metabolic Panel Recent Labs    03/27/19 0859 03/28/19 0610  NA 142 141  K 3.9 3.9  CL 104 101  CO2 28 29  GLUCOSE 118* 90  BUN 15 12  CREATININE 0.72 0.59  CALCIUM 9.1 9.0   Liver Function Tests Recent Labs    03/27/19 0859  AST 33  ALT 23  ALKPHOS 75  BILITOT 1.1  PROT 7.2  ALBUMIN 4.2   Recent Labs    03/27/19 0859  LIPASE 31   Cardiac Enzymes No results for input(s): CKTOTAL, CKMB, CKMBINDEX, TROPONINI in the last 72 hours. BNP No results for input(s): BNP in the last 72 hours. D-Dimer No results for input(s): DDIMER in the last 72 hours. Hemoglobin A1C No results for input(s): HGBA1C in the last 72 hours. Fasting Lipid Panel No results for input(s): CHOL, HDL, LDLCALC, TRIG, CHOLHDL, LDLDIRECT in the last 72 hours. Thyroid Function Tests No results for input(s): TSH, T4TOTAL, T3FREE, THYROIDAB in the last  72 hours.  Invalid input(s): FREET3  Telemetry    Sinus rhythm  ECG    Sinus rhythm with no ischemia  Radiology    DG Chest Portable 1 View  Result Date: 03/27/2019 CLINICAL DATA:  Right-sided chest pain. Hypertension. EXAM: PORTABLE CHEST 1 VIEW COMPARISON:  Chest x-ray/26/ FINDINGS: The heart size is normal. Atherosclerotic calcifications are present at the aortic arch. Vascular congestion is present without frank edema. No focal airspace disease is present. IMPRESSION: Mild pulmonary vascular congestion without frank edema. Electronically Signed   By: San Morelle M.D.   On: 03/27/2019 09:14   VAS [place] LASER ABLATION OF SUPERFICIAL VEI  Result Date: 03/10/2019  Lower Venous Study Indications: Post ablation left.  Performing Technologist: Concha Norway RVT  Examination Guidelines: A complete evaluation  includes B-mode imaging, spectral Doppler, color Doppler, and power Doppler as needed of all accessible portions of each vessel. Bilateral testing is considered an integral part of a complete examination. Limited examinations for reoccurring indications may be performed as noted.  +------+---------------+---------+-----------+----------+--------------+ LEFT  CompressibilityPhasicitySpontaneityPropertiesThrombus Aging +------+---------------+---------+-----------+----------+--------------+ CFV   Full           Yes      Yes                                 +------+---------------+---------+-----------+----------+--------------+ SFJ   Full           Yes      Yes                                 +------+---------------+---------+-----------+----------+--------------+ FV MidFull           Yes      Yes                                 +------+---------------+---------+-----------+----------+--------------+ POP   Full           Yes      Yes                                 +------+---------------+---------+-----------+----------+--------------+ GSV   Full                                                        +------+---------------+---------+-----------+----------+--------------+     Summary: Left: The left GSV is closed post ablation from the access point in calf to mid thigh. Partial compression in mid thigh. Multiple varicosities have clotted off as well.  *See table(s) above for measurements and observations. Electronically signed by Leotis Pain MD on 03/10/2019 at 1:28:22 PM.    Final    US ABDOMEN LIMITED RUQ  Result Date: 03/27/2019 CLINICAL DATA:  Right upper quadrant pain EXAM: ULTRASOUND ABDOMEN LIMITED RIGHT UPPER QUADRANT COMPARISON:  Right upper quadrant pain, chest pain FINDINGS: Gallbladder: Multiple gallstones within the gallbladder, the largest 10 mm. No wall thickening. The patient was tender over the gallbladder during the study. Common bile duct: Diameter: Normal  caliber, 5 mm. Liver: Increased echotexture compatible with fatty infiltration. No focal abnormality or biliary ductal dilatation. Portal vein is patent on color  Doppler imaging with normal direction of blood flow towards the liver. Other: None. IMPRESSION: Cholelithiasis. There is no gallbladder wall thickening, but the patient was tender over the gallbladder during the study. No biliary ductal dilatation. Fatty infiltration of the liver. Electronically Signed   By: Rolm Baptise M.D.   On: 03/27/2019 09:50    Assessment & Plan    1.  Elevated troponin  -Not secondary to acute coronary syndrome.  Abnormal cath approximately 2 years ago.  No ischemia on EKG.  No evidence of of coronary artery disease clinically.  Low risk for surgery from cardiac standpoint.  No further work-up indicated.  Proceed with routine cardiac monitoring  2.  Choledocholithiasis  -Appreciate general surgery assistance.  Laparoscopic surgery pending today.  Signed, Javier Docker Evelio Rueda MD 03/28/2019, 10:31 AM  Pager: [phone]

## 2019-03-28 NOTE — Progress Notes (Signed)
15 minute call to floor. 

## 2019-03-28 NOTE — Progress Notes (Addendum)
Progress Note    Brianna Dalton  [id number] DOB: August 18, 1945  DOA: 03/27/2019 PCP: Idelle Crouch, MD      Brief Narrative:    Medical records reviewed and are as summarized below:  Brianna Dalton is an 74 y.o. female with medical history significant for  mild aortic stenosis, hypertension,  hyperlipidemia, morbid obesity, hypothyroidism and gout who presented to the ER for evaluation of severe right upper quadrant pain that radiated up to her chest.  It was associated with nausea but no vomiting. She said her BP was 230/130 when EMS arrived.      Assessment/Plan:   Principal Problem:   Elevated troponin Active Problems:   Adult hypothyroidism   Morbid obesity (HCC)   Cholelithiasis   Right upper quadrant pain/cholelithiasis: Analgesics as needed for pain.  Antiemetics as needed for nausea.  Patient has been seen by the general surgeon with plan for laparoscopic cholecystectomy today.  Hypertensive urgency: BP is improving  Continue antihypertensives.  Elevated troponin: Patient is likely due to demand ischemia from hypertensive urgency.  Referred chest pain is likely from cholelithiasis.  Gout: Continue allopurinol and colchicine.  Mild aortic stenosis stenosis: No acute issues.  Body mass index is 40.19 kg/m.  (Morbid obesity)   Family Communication/Anticipated D/C date and plan/Code Status   DVT prophylaxis: SCDs Code Status: Full code Family Communication: Plan discussed with patient  disposition Plan: Patient is from home.  Possible discharge to home tomorrow if cleared by surgeon..     Subjective:   She complains of right upper quadrant pain that is throbbing in nature.  It is moderate in severity.  Objective:    Vitals:   03/27/19 2045 03/28/19 0507 03/28/19 0807 03/28/19 1107  BP: (!) 157/82 (!) 149/72 136/90 (!) 165/69  Pulse: 63 65 79 68  Resp: 18 18  16   Temp: 98 F (36.7 C) 98.2 F (36.8 C) 98.3 F (36.8 C) 98.1 F (36.7 C)   TempSrc: Oral Oral Oral Oral  SpO2: 99%  94% 95%  Weight:  127.1 kg    Height:        Intake/Output Summary (Last 24 hours) at 03/28/2019 1223 Last data filed at 03/28/2019 1107 Gross per 24 hour  Intake 1529.79 ml  Output 600 ml  Net 929.79 ml   Filed Weights   03/27/19 0849 03/28/19 0507  Weight: 125.6 kg 127.1 kg    Exam:  GEN: NAD SKIN: No rash EYES: EOMI ENT: MMM CV: RRR.  Systolic murmur heard loudest in the aortic area PULM: CTA B ABD: soft, obese, RUQ tenderness but no rebound tenderness or guarding, +BS CNS: AAO x 3, non focal EXT: No edema or tenderness   Data Reviewed:   I have personally reviewed following labs and imaging studies:  Labs: Labs show the following:   Basic Metabolic Panel: Recent Labs  Lab 03/27/19 0859 03/28/19 0610  NA 142 141  K 3.9 3.9  CL 104 101  CO2 28 29  GLUCOSE 118* 90  BUN 15 12  CREATININE 0.72 0.59  CALCIUM 9.1 9.0   GFR Estimated Creatinine Clearance: 92.2 mL/min (by C-G formula based on SCr of 0.59 mg/dL). Liver Function Tests: Recent Labs  Lab 03/27/19 0859  AST 33  ALT 23  ALKPHOS 75  BILITOT 1.1  PROT 7.2  ALBUMIN 4.2   Recent Labs  Lab 03/27/19 0859  LIPASE 31   No results for input(s): AMMONIA in the last 168 hours. Coagulation profile  Recent Labs  Lab 03/27/19 1345  INR 1.1    CBC: Recent Labs  Lab 03/27/19 0859 03/28/19 0610  WBC 10.8* 6.8  NEUTROABS 8.3*  --   HGB 14.9 13.9  HCT 45.3 43.5  MCV 94.0 96.9  PLT 189 172   Cardiac Enzymes: No results for input(s): CKTOTAL, CKMB, CKMBINDEX, TROPONINI in the last 168 hours. BNP (last 3 results) No results for input(s): PROBNP in the last 8760 hours. CBG: No results for input(s): GLUCAP in the last 168 hours. D-Dimer: No results for input(s): DDIMER in the last 72 hours. Hgb A1c: No results for input(s): HGBA1C in the last 72 hours. Lipid Profile: No results for input(s): CHOL, HDL, LDLCALC, TRIG, CHOLHDL, LDLDIRECT in the last  72 hours. Thyroid function studies: No results for input(s): TSH, T4TOTAL, T3FREE, THYROIDAB in the last 72 hours.  Invalid input(s): FREET3 Anemia work up: No results for input(s): VITAMINB12, FOLATE, FERRITIN, TIBC, IRON, RETICCTPCT in the last 72 hours. Sepsis Labs: Recent Labs  Lab 03/27/19 0859 03/28/19 0610  WBC 10.8* 6.8    Microbiology Recent Results (from the past 240 hour(s))  SARS CORONAVIRUS 2 (TAT 6-24 HRS) Nasopharyngeal Nasopharyngeal Swab     Status: None   Collection Time: 03/27/19 12:29 PM   Specimen: Nasopharyngeal Swab  Result Value Ref Range Status   SARS Coronavirus 2 NEGATIVE NEGATIVE Final    Comment: (NOTE) SARS-CoV-2 target nucleic acids are NOT DETECTED. The SARS-CoV-2 RNA is generally detectable in upper and lower respiratory specimens during the acute phase of infection. Negative results do not preclude SARS-CoV-2 infection, do not rule out co-infections with other pathogens, and should not be used as the sole basis for treatment or other patient management decisions. Negative results must be combined with clinical observations, patient history, and epidemiological information. The expected result is Negative. Fact Sheet for Patients: SugarRoll.be Fact Sheet for Healthcare Providers: https://www.woods-mathews.com/ This test is not yet approved or cleared by the [place] FDA and  has been authorized for detection and/or diagnosis of SARS-CoV-2 by FDA under an Emergency Use Authorization (EUA). This EUA will remain  in effect (meaning this test can be used) for the duration of the COVID-19 declaration under Section 56 4(b)(1) of the Act, 21 U.S.C. section 360bbb-3(b)(1), unless the authorization is terminated or revoked sooner. Performed at [facility], [place] [address] [place], [place] [address]     Procedures and diagnostic studies:  DG Chest Portable 1 View  Result Date:  03/27/2019 CLINICAL DATA:  Right-sided chest pain. Hypertension. EXAM: PORTABLE CHEST 1 VIEW COMPARISON:  Chest x-ray/26/ FINDINGS: The heart size is normal. Atherosclerotic calcifications are present at the aortic arch. Vascular congestion is present without frank edema. No focal airspace disease is present. IMPRESSION: Mild pulmonary vascular congestion without frank edema. Electronically Signed   By: San Morelle M.D.   On: 03/27/2019 09:14   US ABDOMEN LIMITED RUQ  Result Date: 03/27/2019 CLINICAL DATA:  Right upper quadrant pain EXAM: ULTRASOUND ABDOMEN LIMITED RIGHT UPPER QUADRANT COMPARISON:  Right upper quadrant pain, chest pain FINDINGS: Gallbladder: Multiple gallstones within the gallbladder, the largest 10 mm. No wall thickening. The patient was tender over the gallbladder during the study. Common bile duct: Diameter: Normal caliber, 5 mm. Liver: Increased echotexture compatible with fatty infiltration. No focal abnormality or biliary ductal dilatation. Portal vein is patent on color Doppler imaging with normal direction of blood flow towards the liver. Other: None. IMPRESSION: Cholelithiasis. There is no gallbladder wall thickening, but the  patient was tender over the gallbladder during the study. No biliary ductal dilatation. Fatty infiltration of the liver. Electronically Signed   By: Rolm Baptise M.D.   On: 03/27/2019 09:50    Medications:   . allopurinol  300 mg Oral Daily  . aspirin EC  81 mg Oral Daily  . atorvastatin  40 mg Oral q1800  . enalapril  20 mg Oral Daily  . levothyroxine  25 mcg Oral QAC breakfast  . metoprolol tartrate  12.5 mg Oral BID  . torsemide  10 mg Oral Daily   Continuous Infusions: . ciprofloxacin    . lactated ringers 100 mL/hr at 03/28/19 0556     LOS: 1 day   Adewale Pucillo  [facility]     03/28/2019, 12:23 PM

## 2019-03-28 NOTE — Anesthesia Procedure Notes (Signed)
Procedure Name: Intubation Date/Time: 03/28/2019 1:01 PM Performed by: Chanetta Marshall, CRNA Pre-anesthesia Checklist: Patient identified, Emergency Drugs available, Suction available and Patient being monitored Patient Re-evaluated:Patient Re-evaluated prior to induction Oxygen Delivery Method: Circle system utilized Preoxygenation: Pre-oxygenation with 100% oxygen Induction Type: IV induction Ventilation: Mask ventilation without difficulty Laryngoscope Size: McGraph and 4 Grade View: Grade I Tube type: Oral Number of attempts: 1 Airway Equipment and Method: Video-laryngoscopy Placement Confirmation: ETT inserted through vocal cords under direct vision,  positive ETCO2,  breath sounds checked- equal and bilateral and CO2 detector Secured at: 21 cm Tube secured with: Tape Dental Injury: Teeth and Oropharynx as per pre-operative assessment

## 2019-03-28 NOTE — Progress Notes (Signed)
03/28/2019  Subjective: No acute events overnight.  Patient that she still having right upper quadrant pain.  Pain medication to help ease the pain but otherwise it has not resolved.  He denies any nausea or vomiting.  She was evaluated by cardiology and cleared for surgery.  Her troponins have been low but stable with repeat of 44 and normal.  Vital signs: Temp:  [98 F (36.7 C)-98.3 F (36.8 C)] 98.3 F (36.8 C) (03/06 0807) Pulse Rate:  [51-79] 79 (03/06 0807) Resp:  [12-32] 18 (03/06 0507) BP: (107-157)/(59-90) 136/90 (03/06 0807) SpO2:  [94 %-99 %] 94 % (03/06 0807) Weight:  [127.1 kg] 127.1 kg (03/06 0507)   Intake/Output: 03/05 0701 - 03/06 0700 In: 1529.8 [P.O.:240; I.V.:1289.8] Out: 600 [Urine:600] Last BM Date: 03/26/19  Physical Exam: Constitutional: No acute distress Abdomen:, Obese, nondistended, mildly tender to the right upper quadrant.  Labs:  Recent Labs    03/27/19 0859 03/28/19 0610  WBC 10.8* 6.8  HGB 14.9 13.9  HCT 45.3 43.5  PLT 189 172   Recent Labs    03/27/19 0859 03/28/19 0610  NA 142 141  K 3.9 3.9  CL 104 101  CO2 28 29  GLUCOSE 118* 90  BUN 15 12  CREATININE 0.72 0.59  CALCIUM 9.1 9.0   Recent Labs    03/27/19 1345  LABPROT 14.0  INR 1.1    Imaging: No results found.  Assessment/Plan: This is a 74 y.o. female with likely acute cholecystitis.  -We will feel there was no gallbladder wall thickening or pericholecystic fluid on the ultrasound, the patient continues having right upper quadrant pain.  She has been cleared by cardiology.  Her labs today are normal.  Discussed with her that we will proceed for laparoscopic cholecystectomy.  Discussed with her the risks of bleeding, infection, incomplete surrounding structures.  Discussed with her the possibility for open procedure.  She is willing to proceed.  We will take her to the operating room today pending availability.   Melvyn Neth, [facility]

## 2019-03-28 NOTE — Transfer of Care (Signed)
Immediate Anesthesia Transfer of Care Note  Patient: Brianna Dalton  Procedure(s) Performed: LAPAROSCOPIC CHOLECYSTECTOMY (N/A )  Patient Location: PACU  Anesthesia Type:General  Level of Consciousness: awake and alert   Airway & Oxygen Therapy: Patient Spontanous Breathing and Patient connected to nasal cannula oxygen  Post-op Assessment: Report given to RN and Post -op Vital signs reviewed and stable  Post vital signs: Reviewed and stable  Last Vitals:  Vitals Value Taken Time  BP 129/71 03/28/19 1522  Temp    Pulse 78 03/28/19 1523  Resp 16 03/28/19 1523  SpO2 95 % 03/28/19 1523  Vitals shown include unvalidated device data.  Last Pain:  Vitals:   03/28/19 1107  TempSrc: Oral  PainSc:       Patients Stated Pain Goal: 0 ([id number])  Complications: No apparent anesthesia complications

## 2019-03-29 DIAGNOSIS — K810 Acute cholecystitis: Secondary | ICD-10-CM

## 2019-03-29 DIAGNOSIS — K8000 Calculus of gallbladder with acute cholecystitis without obstruction: Secondary | ICD-10-CM

## 2019-03-29 MED ORDER — TRAMADOL HCL 50 MG PO TABS: 50.0000 mg | ORAL_TABLET | ORAL | 0 refills | Status: DC | PRN

## 2019-03-29 MED ORDER — CIPROFLOXACIN HCL 500 MG PO TABS: 500.0000 mg | ORAL_TABLET | Freq: Two times a day (BID) | ORAL | 0 refills | Status: AC

## 2019-03-29 MED ORDER — ONDANSETRON HCL 4 MG PO TABS: 4.0000 mg | ORAL_TABLET | Freq: Three times a day (TID) | ORAL | 0 refills | Status: DC | PRN

## 2019-03-29 NOTE — Discharge Summary (Addendum)
Physician Discharge Summary  Brianna Dalton [id number] DOB: 11/24/1945 DOA: 03/27/2019  PCP: Idelle Crouch, MD  Admit date: 03/27/2019 Discharge date: 03/29/2019  Discharge disposition: Home   Recommendations for Outpatient Follow-Up:    Follow-up with general surgeon, Dr. Hampton Abbot, in 2 weeks Follow-up with cardiologist, Dr. Nehemiah Massed in 1 week   Discharge Diagnosis:   Principal Problem:   Acute cholecystitis Active Problems:   Adult hypothyroidism   Morbid obesity ([place])   Elevated troponin   Cholelithiasis    Discharge Condition: Stable.  Diet recommendation: Heart healthy diet  Code status: Full code.    Hospital Course:   Brianna Dalton is an 74 y.o. female with medical history significant for mild aortic stenosis, hypertension, hyperlipidemia, morbid obesity, hypothyroidism and gout who presented tothe ER for evaluation of severe right upper quadrant pain that radiated up to her chest.  It was associated with nausea but no vomiting. She said her BP was 230/130 when EMS arrived.  On admission, her troponins were minimally elevated.  Cardiologist was consulted for this.  However, it was believed that mildly elevated troponins were likely due to demand ischemia.  Liver ultrasound showed cholelithiasis which was likely responsible for her symptoms.  She was seen in consultation by the general surgeon and she underwent laparoscopic cholecystectomy on 03/29/2019 and postoperative diagnosis was acute cholecystitis.  Her pain has improved and she has been able to tolerate a diet without any worsening symptoms or vomiting.  She has been cleared by cardiologist and general surgeon for discharge today.      Discharge Exam:   Vitals:   03/29/19 0831 03/29/19 1230  BP: (!) 145/82 (!) 145/74  Pulse: 71 62  Resp: 18 18  Temp: 98 F (36.7 C) 98.3 F (36.8 C)  SpO2: 97% 98%   Vitals:   03/28/19 2143 03/29/19 0434 03/29/19 0831 03/29/19 1230  BP: 132/68 135/79  (!) 145/82 (!) 145/74  Pulse: 80 70 71 62  Resp:  18 18 18   Temp:  98 F (36.7 C) 98 F (36.7 C) 98.3 F (36.8 C)  TempSrc:  Oral Oral Oral  SpO2: 94% 94% 97% 98%  Weight:  127.9 kg    Height:         GEN: NAD SKIN: No rash EYES: EOMI ENT: MMM CV: RRR PULM: CTA B ABD: soft, obese, surgical tenderness, +BS CNS: AAO x 3, non focal EXT: No edema or tenderness   The results of significant diagnostics from this hospitalization (including imaging, microbiology, ancillary and laboratory) are listed below for reference.     Procedures and Diagnostic Studies:   DG Chest Portable 1 View  Result Date: 03/27/2019 CLINICAL DATA:  Right-sided chest pain. Hypertension. EXAM: PORTABLE CHEST 1 VIEW COMPARISON:  Chest x-ray/26/ FINDINGS: The heart size is normal. Atherosclerotic calcifications are present at the aortic arch. Vascular congestion is present without frank edema. No focal airspace disease is present. IMPRESSION: Mild pulmonary vascular congestion without frank edema. Electronically Signed   By: San Morelle M.D.   On: 03/27/2019 09:14   US ABDOMEN LIMITED RUQ  Result Date: 03/27/2019 CLINICAL DATA:  Right upper quadrant pain EXAM: ULTRASOUND ABDOMEN LIMITED RIGHT UPPER QUADRANT COMPARISON:  Right upper quadrant pain, chest pain FINDINGS: Gallbladder: Multiple gallstones within the gallbladder, the largest 10 mm. No wall thickening. The patient was tender over the gallbladder during the study. Common bile duct: Diameter: Normal caliber, 5 mm. Liver: Increased echotexture compatible with fatty infiltration. No focal abnormality or biliary  ductal dilatation. Portal vein is patent on color Doppler imaging with normal direction of blood flow towards the liver. Other: None. IMPRESSION: Cholelithiasis. There is no gallbladder wall thickening, but the patient was tender over the gallbladder during the study. No biliary ductal dilatation. Fatty infiltration of the liver. Electronically  Signed   By: Rolm Baptise M.D.   On: 03/27/2019 09:50     Labs:   Basic Metabolic Panel: Recent Labs  Lab 03/27/19 0859 03/28/19 0610  NA 142 141  K 3.9 3.9  CL 104 101  CO2 28 29  GLUCOSE 118* 90  BUN 15 12  CREATININE 0.72 0.59  CALCIUM 9.1 9.0   GFR Estimated Creatinine Clearance: 92.6 mL/min (by C-G formula based on SCr of 0.59 mg/dL). Liver Function Tests: Recent Labs  Lab 03/27/19 0859  AST 33  ALT 23  ALKPHOS 75  BILITOT 1.1  PROT 7.2  ALBUMIN 4.2   Recent Labs  Lab 03/27/19 0859  LIPASE 31   No results for input(s): AMMONIA in the last 168 hours. Coagulation profile Recent Labs  Lab 03/27/19 1345  INR 1.1    CBC: Recent Labs  Lab 03/27/19 0859 03/28/19 0610  WBC 10.8* 6.8  NEUTROABS 8.3*  --   HGB 14.9 13.9  HCT 45.3 43.5  MCV 94.0 96.9  PLT 189 172   Cardiac Enzymes: No results for input(s): CKTOTAL, CKMB, CKMBINDEX, TROPONINI in the last 168 hours. BNP: Invalid input(s): POCBNP CBG: No results for input(s): GLUCAP in the last 168 hours. D-Dimer No results for input(s): DDIMER in the last 72 hours. Hgb A1c No results for input(s): HGBA1C in the last 72 hours. Lipid Profile No results for input(s): CHOL, HDL, LDLCALC, TRIG, CHOLHDL, LDLDIRECT in the last 72 hours. Thyroid function studies No results for input(s): TSH, T4TOTAL, T3FREE, THYROIDAB in the last 72 hours.  Invalid input(s): FREET3 Anemia work up No results for input(s): VITAMINB12, FOLATE, FERRITIN, TIBC, IRON, RETICCTPCT in the last 72 hours. Microbiology Recent Results (from the past 240 hour(s))  SARS CORONAVIRUS 2 (TAT 6-24 HRS) Nasopharyngeal Nasopharyngeal Swab     Status: None   Collection Time: 03/27/19 12:29 PM   Specimen: Nasopharyngeal Swab  Result Value Ref Range Status   SARS Coronavirus 2 NEGATIVE NEGATIVE Final    Comment: (NOTE) SARS-CoV-2 target nucleic acids are NOT DETECTED. The SARS-CoV-2 RNA is generally detectable in upper and  lower respiratory specimens during the acute phase of infection. Negative results do not preclude SARS-CoV-2 infection, do not rule out co-infections with other pathogens, and should not be used as the sole basis for treatment or other patient management decisions. Negative results must be combined with clinical observations, patient history, and epidemiological information. The expected result is Negative. Fact Sheet for Patients: SugarRoll.be Fact Sheet for Healthcare Providers: https://www.woods-mathews.com/ This test is not yet approved or cleared by the [place] FDA and  has been authorized for detection and/or diagnosis of SARS-CoV-2 by FDA under an Emergency Use Authorization (EUA). This EUA will remain  in effect (meaning this test can be used) for the duration of the COVID-19 declaration under Section 56 4(b)(1) of the Act, 21 U.S.C. section 360bbb-3(b)(1), unless the authorization is terminated or revoked sooner. Performed at [facility], [place] [address] [place], [place] [address]      Discharge Instructions:   Discharge Instructions    Diet - low sodium heart healthy   Complete by: As directed    Increase activity slowly   Complete  by: As directed      Allergies as of 03/29/2019      Reactions   Oxycodone-acetaminophen Hives, Rash, Shortness Of Breath, Swelling   Strawberry Flavor Shortness Of Breath, Rash   Aloe Vera Hives   Amlodipine    Hydrocodone Hives   Latex    NEGATIVE BY IgE (<0.10)   Morphine And Related Other (See Comments)   Nausea delusion   Penicillins Hives   Has patient had a PCN reaction causing immediate rash, facial/tongue/throat swelling, SOB or lightheadedness with hypotension: Yes Has patient had a PCN reaction causing severe rash involving mucus membranes or skin necrosis: No Has patient had a PCN reaction that required hospitalization: No Has patient had a PCN reaction occurring  within the last 10 years: No If all of the above answers are "NO", then may proceed with Cephalosporin use.   Meperidine Rash      Medication List    TAKE these medications   allopurinol 300 MG tablet Commonly known as: ZYLOPRIM Take 300 mg by mouth daily.   aspirin EC 81 MG tablet Take 81 mg by mouth daily.   ciprofloxacin 500 MG tablet Commonly known as: Cipro Take 1 tablet (500 mg total) by mouth 2 (two) times daily for 5 days.   colchicine 0.6 MG tablet Take 0.6 mg by mouth as needed.   levothyroxine 125 MCG tablet Commonly known as: SYNTHROID Take 125 mcg by mouth daily before breakfast.   losartan 50 MG tablet Commonly known as: COZAAR Take 50 mg by mouth daily.   metoprolol tartrate 25 MG tablet Commonly known as: LOPRESSOR Take 12.5 mg by mouth 2 (two) times daily.   ondansetron 4 MG tablet Commonly known as: Zofran Take 1 tablet (4 mg total) by mouth every 8 (eight) hours as needed for nausea or vomiting.   torsemide 10 MG tablet Commonly known as: DEMADEX Take 10 mg by mouth daily as needed.   traMADol 50 MG tablet Commonly known as: ULTRAM Take 1-2 tablets (50-100 mg total) by mouth every 4 (four) hours as needed for moderate pain. What changed: Another medication with the same name was added. Make sure you understand how and when to take each. Notes to patient: Today 03/29/2019 at 1 pm     traMADol 50 MG tablet Commonly known as: Ultram Take 1 tablet (50 mg total) by mouth every 4 (four) hours as needed for severe pain. What changed: You were already taking a medication with the same name, and this prescription was added. Make sure you understand how and when to take each.      Follow-up Information    Corey Skains, MD Follow up in 1 week(s).   Specialty: Cardiology Contact information: [address] [facility] [place] [address] [phone]        Tylene Fantasia, PA-C Follow up in 2 week(s).    Specialty: Physician Assistant Contact information: [address] [place] [address] [phone]            Time coordinating discharge: 26 minutes  Signed:  Jennye Boroughs  [facility] 03/29/2019, 4:09 PM

## 2019-03-29 NOTE — Progress Notes (Signed)
Patient Name: Brianna Dalton Date of Encounter: 03/29/2019  Hospital Problem List     Principal Problem:   Elevated troponin Active Problems:   Adult hypothyroidism   Morbid obesity ([place])   Cholelithiasis    Patient Profile     74 y.o.femalewith history ofatypical chest pain with history of cardiac catheterization in March 2019 showing completely normal coronaries.  Admitted with right-sided chest pain right flank pain noted to have gallstones.  Minimal troponin elevation secondary due to demand.  No evidence of acute coronary syndrome.S/P lap chole.   Subjective   No chest pain. Mild surgical discomfort  Inpatient Medications    . allopurinol  300 mg Oral Daily  . aspirin EC  81 mg Oral Daily  . atorvastatin  40 mg Oral q1800  . enalapril  20 mg Oral Daily  . ketorolac  15 mg Intravenous Q6H  . levothyroxine  25 mcg Oral QAC breakfast  . metoprolol tartrate  12.5 mg Oral BID  . torsemide  10 mg Oral Daily    Vital Signs    Vitals:   03/28/19 1945 03/28/19 2143 03/29/19 0434 03/29/19 0831  BP: 135/73 132/68 135/79 (!) 145/82  Pulse: 78 80 70 71  Resp: 18  18 18   Temp: 98 F (36.7 C)  98 F (36.7 C) 98 F (36.7 C)  TempSrc: Oral  Oral Oral  SpO2: 96% 94% 94% 97%  Weight:   127.9 kg   Height:        Intake/Output Summary (Last 24 hours) at 03/29/2019 0942 Last data filed at 03/29/2019 0500 Gross per 24 hour  Intake 2520 ml  Output 1730 ml  Net 790 ml   Filed Weights   03/27/19 0849 03/28/19 0507 03/29/19 0434  Weight: 125.6 kg 127.1 kg 127.9 kg    Physical Exam    GEN: Well nourished, well developed, in no acute distress.  HEENT: normal.  Neck: Supple, no JVD, carotid bruits, or masses. Cardiac: RRR, no murmurs, rubs, or gallops. No clubbing, cyanosis, edema.  Radials/DP/PT 2+ and equal bilaterally.  Respiratory:  Respirations regular and unlabored, clear to auscultation bilaterally. GI: Soft, nontender, nondistended, BS + x 4. MS: no deformity or  atrophy. Skin: warm and dry, no rash. Neuro:  Strength and sensation are intact. Psych: Normal affect.  Labs    CBC Recent Labs    03/27/19 0859 03/28/19 0610  WBC 10.8* 6.8  NEUTROABS 8.3*  --   HGB 14.9 13.9  HCT 45.3 43.5  MCV 94.0 96.9  PLT 189 [id number]   Basic Metabolic Panel Recent Labs    03/27/19 0859 03/28/19 0610  NA 142 141  K 3.9 3.9  CL 104 101  CO2 28 29  GLUCOSE 118* 90  BUN 15 12  CREATININE 0.72 0.59  CALCIUM 9.1 9.0   Liver Function Tests Recent Labs    03/27/19 0859  AST 33  ALT 23  ALKPHOS 75  BILITOT 1.1  PROT 7.2  ALBUMIN 4.2   Recent Labs    03/27/19 0859  LIPASE 31   Cardiac Enzymes No results for input(s): CKTOTAL, CKMB, CKMBINDEX, TROPONINI in the last 72 hours. BNP No results for input(s): BNP in the last 72 hours. D-Dimer No results for input(s): DDIMER in the last 72 hours. Hemoglobin A1C No results for input(s): HGBA1C in the last 72 hours. Fasting Lipid Panel No results for input(s): CHOL, HDL, LDLCALC, TRIG, CHOLHDL, LDLDIRECT in the last 72 hours. Thyroid Function Tests No results for  input(s): TSH, T4TOTAL, T3FREE, THYROIDAB in the last 72 hours.  Invalid input(s): FREET3  Telemetry    nsr  ECG    nsr with no ischemia  Radiology    DG Chest Portable 1 View  Result Date: 03/27/2019 CLINICAL DATA:  Right-sided chest pain. Hypertension. EXAM: PORTABLE CHEST 1 VIEW COMPARISON:  Chest x-ray/26/ FINDINGS: The heart size is normal. Atherosclerotic calcifications are present at the aortic arch. Vascular congestion is present without frank edema. No focal airspace disease is present. IMPRESSION: Mild pulmonary vascular congestion without frank edema. Electronically Signed   By: San Morelle M.D.   On: 03/27/2019 09:14   VAS [place] LASER ABLATION OF SUPERFICIAL VEI  Result Date: 03/10/2019  Lower Venous Study Indications: Post ablation left.  Performing Technologist: Concha Norway RVT  Examination Guidelines: A  complete evaluation includes B-mode imaging, spectral Doppler, color Doppler, and power Doppler as needed of all accessible portions of each vessel. Bilateral testing is considered an integral part of a complete examination. Limited examinations for reoccurring indications may be performed as noted.  +------+---------------+---------+-----------+----------+--------------+ LEFT  CompressibilityPhasicitySpontaneityPropertiesThrombus Aging +------+---------------+---------+-----------+----------+--------------+ CFV   Full           Yes      Yes                                 +------+---------------+---------+-----------+----------+--------------+ SFJ   Full           Yes      Yes                                 +------+---------------+---------+-----------+----------+--------------+ FV MidFull           Yes      Yes                                 +------+---------------+---------+-----------+----------+--------------+ POP   Full           Yes      Yes                                 +------+---------------+---------+-----------+----------+--------------+ GSV   Full                                                        +------+---------------+---------+-----------+----------+--------------+     Summary: Left: The left GSV is closed post ablation from the access point in calf to mid thigh. Partial compression in mid thigh. Multiple varicosities have clotted off as well.  *See table(s) above for measurements and observations. Electronically signed by Leotis Pain MD on 03/10/2019 at 1:28:22 PM.    Final    US ABDOMEN LIMITED RUQ  Result Date: 03/27/2019 CLINICAL DATA:  Right upper quadrant pain EXAM: ULTRASOUND ABDOMEN LIMITED RIGHT UPPER QUADRANT COMPARISON:  Right upper quadrant pain, chest pain FINDINGS: Gallbladder: Multiple gallstones within the gallbladder, the largest 10 mm. No wall thickening. The patient was tender over the gallbladder during the study. Common bile  duct: Diameter: Normal caliber, 5 mm. Liver: Increased echotexture compatible with fatty infiltration. No focal abnormality or biliary ductal dilatation.  Portal vein is patent on color Doppler imaging with normal direction of blood flow towards the liver. Other: None. IMPRESSION: Cholelithiasis. There is no gallbladder wall thickening, but the patient was tender over the gallbladder during the study. No biliary ductal dilatation. Fatty infiltration of the liver. Electronically Signed   By: Rolm Baptise M.D.   On: 03/27/2019 09:50    Assessment & Plan    1.  Elevated troponin  -Not secondary to acute coronary syndrome.  Abnormal cath approximately 2 years ago.  No ischemia on EKG.  No evidence of of coronary artery disease clinically.  No further work-up indicated.  OK for discharge from cardiac standpoint on current meds. F/U with Dr. Nehemiah Massed as needed.   2.  Choledocholithiasis  -Appreciate Dr. Mont Dutton excellent care. .  S/P lap chole.    Signed, Javier Docker Gabby Rackers MD 03/29/2019, 9:42 AM  Pager: [phone]

## 2019-03-29 NOTE — Progress Notes (Signed)
Discussed discharge instructions including medications and follow up appointments.  Encouraged patient to monitor infection at surgical site.    Patient did not have any further questions.

## 2019-03-29 NOTE — Anesthesia Postprocedure Evaluation (Signed)
Anesthesia Post Note  Patient: Brianna Dalton  Procedure(s) Performed: LAPAROSCOPIC CHOLECYSTECTOMY (N/A )  Patient location during evaluation: PACU Anesthesia Type: General Level of consciousness: awake and alert Pain management: pain level controlled Vital Signs Assessment: post-procedure vital signs reviewed and stable Respiratory status: spontaneous breathing, nonlabored ventilation and respiratory function stable Cardiovascular status: blood pressure returned to baseline and stable Postop Assessment: no apparent nausea or vomiting Anesthetic complications: no     Last Vitals:  Vitals:   03/28/19 2143 03/29/19 0434  BP: 132/68 135/79  Pulse: 80 70  Resp:  18  Temp:  36.7 C  SpO2: 94% 94%    Last Pain:  Vitals:   03/29/19 0434  TempSrc: Oral  PainSc:                  Alphonsus Sias

## 2019-03-30 ENCOUNTER — Telehealth: Payer: Self-pay | Admitting: Surgery

## 2019-03-30 ENCOUNTER — Other Ambulatory Visit: Payer: Self-pay | Admitting: Surgery

## 2019-03-30 MED ORDER — TRAMADOL HCL 50 MG PO TABS: 50.0000 mg | ORAL_TABLET | ORAL | 0 refills | Status: AC | PRN

## 2019-03-30 NOTE — Telephone Encounter (Signed)
Patient is calling and said her pain medication was sent into the wrong pharmacy, patient is asking if we could send the rx into the right one. Please call patient and advise.

## 2019-03-30 NOTE — Telephone Encounter (Signed)
Spoke with patient's sister. She states patient's prescription for Tramadol was sent to the wrong pharmacy. She requested the prescription be sent to the [facility] pharmacy on [address]. Informed Dr.Piscoya and he will send over prescription.

## 2019-03-31 LAB — SURGICAL PATHOLOGY

## 2019-04-03 ENCOUNTER — Telehealth: Payer: Self-pay | Admitting: *Deleted

## 2019-04-03 NOTE — Telephone Encounter (Signed)
Per Dr.Piscoya stated today would have patient's last day to complete the prescribed antibiotic Cipro. He advised patient to try over the counter Benadryl to help with the itching and hives. Patient notified that if the area of concerned does not improve to give our office a call back to get scheduled for an appointment. Patient verbalized understanding and has no further questions.

## 2019-04-03 NOTE — Telephone Encounter (Signed)
Patient called and stated that she is taking ciprofloxacin and she woke up during the night and she was burning in her stomach area, about 3:30am this morning she notcied she had little red bumps on her stomach running around to left breast and under her left breast. It is itching, she stated that it does look a little like hives. Her last dose of taking ciprofloxacin was yesterday morning. I advise patient to not take any more and I will have a nurse give her a call back.   Patient had surgery on 03/28/19 by Dr Hampton Abbot laparoscopic cholecystectomy

## 2019-04-14 DIAGNOSIS — I350 Nonrheumatic aortic (valve) stenosis: Secondary | ICD-10-CM | POA: Insufficient documentation

## 2019-04-14 DIAGNOSIS — R001 Bradycardia, unspecified: Secondary | ICD-10-CM | POA: Insufficient documentation

## 2019-04-14 DIAGNOSIS — E7800 Pure hypercholesterolemia, unspecified: Secondary | ICD-10-CM | POA: Diagnosis not present

## 2019-04-14 DIAGNOSIS — I6523 Occlusion and stenosis of bilateral carotid arteries: Secondary | ICD-10-CM | POA: Diagnosis not present

## 2019-04-14 DIAGNOSIS — I10 Essential (primary) hypertension: Secondary | ICD-10-CM | POA: Diagnosis not present

## 2019-04-15 ENCOUNTER — Ambulatory Visit (INDEPENDENT_AMBULATORY_CARE_PROVIDER_SITE_OTHER): Payer: Self-pay | Admitting: Physician Assistant

## 2019-04-15 ENCOUNTER — Other Ambulatory Visit: Payer: Self-pay

## 2019-04-15 ENCOUNTER — Encounter: Payer: Self-pay | Admitting: Physician Assistant

## 2019-04-15 VITALS — BP 144/96 | HR 74 | Temp 91.0°F | Ht 70.0 in | Wt 270.6 lb

## 2019-04-15 DIAGNOSIS — Z09 Encounter for follow-up examination after completed treatment for conditions other than malignant neoplasm: Secondary | ICD-10-CM

## 2019-04-15 DIAGNOSIS — K810 Acute cholecystitis: Secondary | ICD-10-CM

## 2019-04-15 NOTE — Progress Notes (Signed)
[facility] POST-OP OFFICE VISIT  04/15/2019  HPI: Brianna Dalton is a 74 y.o. female 18 days s/p laparoscopic cholecystectomy for acute cholecystitis with Dr Hampton Abbot  She reports that she is doing well No issues with pain, nausea, or emesis She is tolerating PO but notices she gets full quicker No issues with bowel function No additional complaints.    Vital signs: BP (!) 144/96   Pulse 74   Temp (!) 91 F (32.8 C) (Temporal)   Ht 5\' 10"  (1.778 m)   Wt 270 lb 9.6 oz (122.7 kg)   SpO2 96%   BMI 38.83 kg/m    Physical Exam: Constitutional: Well appearing female, NAD Abdomen: Soft, non-tender, non-distended, no rebound/guarding Skin: Laparoscopic incisions are well healed, no erythema or drainage   Assessment/Plan: This is a 74 y.o. female 18 days s/p laparoscopic cholecystectomy for acute cholecystitis   - Pain control prn  - reviewed wound care  - Lifting restrictions x2 more weeks  - reviewed pathology: Chronic cholecystitis and cholelithiasis  - rtc prn; discussed return precautions  -- Edison Simon, PA-C [facility] 04/15/2019, 10:11 AM [phone] M-F: 7am - 4pm

## 2019-04-15 NOTE — Patient Instructions (Addendum)
Thedore Mins advised patient to refrain from any heavy lifting of 10-15 pounds or more for two more weeks.  If patient noticed fever, chills, or uncontrollable diarrhea patient will give our office a call.   GENERAL POST-OPERATIVE PATIENT INSTRUCTIONS   FOLLOW-UP:  Please make an appointment with your physician in.  Call your physician immediately if you have any fevers greater than 102.5, drainage from you wound that is not clear or looks infected, persistent bleeding, increasing abdominal pain, problems urinating, or persistent nausea/vomiting.    WOUND CARE INSTRUCTIONS:  Keep a dry clean dressing on the wound if there is drainage. The initial bandage may be removed after 24 hours.  Once the wound has quit draining you may leave it open to air.  If clothing rubs against the wound or causes irritation and the wound is not draining you may cover it with a dry dressing during the daytime.  Try to keep the wound dry and avoid ointments on the wound unless directed to do so.  If the wound becomes bright red and painful or starts to drain infected material that is not clear, please contact your physician immediately.  If the wound is mildly pink and has a thick firm ridge underneath it, this is normal, and is referred to as a healing ridge.  This will resolve over the next 4-6 weeks.  DIET:  You may eat any foods that you can tolerate.  It is a good idea to eat a high fiber diet and take in plenty of fluids to prevent constipation.  If you do become constipated you may want to take a mild laxative or take ducolax tablets on a daily basis until your bowel habits are regular.  Constipation can be very uncomfortable, along with straining, after recent surgery.  ACTIVITY:  You are encouraged to cough and deep breath or use your incentive spirometer if you were given one, every 15-30 minutes when awake.  This will help prevent respiratory complications and low grade fevers post-operatively if you had a general  anesthetic.  You may want to hug a pillow when coughing and sneezing to add additional support to the surgical area, if you had abdominal or chest surgery, which will decrease pain during these times.  You are encouraged to walk and engage in light activity for the next two weeks.  You should not lift more than 20 pounds during this time frame as it could put you at increased risk for complications.  Twenty pounds is roughly equivalent to a plastic bag of groceries.    MEDICATIONS:  Try to take narcotic medications and anti-inflammatory medications, such as tylenol, ibuprofen, naprosyn, etc., with food.  This will minimize stomach upset from the medication.  Should you develop nausea and vomiting from the pain medication, or develop a rash, please discontinue the medication and contact your physician.  You should not drive, make important decisions, or operate machinery when taking narcotic pain medication.  QUESTIONS:  Please feel free to call your physician or the hospital operator if you have any questions, and they will be glad to assist you.

## 2019-05-05 ENCOUNTER — Ambulatory Visit (INDEPENDENT_AMBULATORY_CARE_PROVIDER_SITE_OTHER): Payer: Medicare HMO | Admitting: Vascular Surgery

## 2019-05-05 ENCOUNTER — Other Ambulatory Visit: Payer: Self-pay

## 2019-05-05 ENCOUNTER — Encounter (INDEPENDENT_AMBULATORY_CARE_PROVIDER_SITE_OTHER): Payer: Self-pay | Admitting: Vascular Surgery

## 2019-05-05 VITALS — BP 148/94 | HR 60 | Ht 70.0 in | Wt 271.0 lb

## 2019-05-05 DIAGNOSIS — I83812 Varicose veins of left lower extremities with pain: Secondary | ICD-10-CM | POA: Diagnosis not present

## 2019-05-05 DIAGNOSIS — I83813 Varicose veins of bilateral lower extremities with pain: Secondary | ICD-10-CM

## 2019-05-05 NOTE — Progress Notes (Signed)
Brianna Dalton is a 74 y.o.female who presents with painful varicose veins of the left leg  Past Medical History:  Diagnosis Date  . Angina pectoris syndrome ([place])   . Arterial hemorrhage    Lt ankle possible venous bleed  . Arthritis   . Hypertension   . Hypothyroidism   . Neuropathy   . Varicose vein of leg     Past Surgical History:  Procedure Laterality Date  . ABDOMINAL HYSTERECTOMY    . BREAST BIOPSY Right 01/19/2013   stereo biopsy of two areas neg  . CHOLECYSTECTOMY N/A 03/28/2019   Procedure: LAPAROSCOPIC CHOLECYSTECTOMY;  Surgeon: Olean Ree, MD;  Location: [facility];  Service: General;  Laterality: N/A;  . JOINT REPLACEMENT     lt knee  . KNEE ARTHROPLASTY Right 04/27/2015   Procedure: COMPUTER ASSISTED TOTAL KNEE ARTHROPLASTY;  Surgeon: Dereck Leep, MD;  Location: [facility];  Service: Orthopedics;  Laterality: Right;  . LEFT HEART CATH AND CORONARY ANGIOGRAPHY N/A 04/10/2017   Procedure: LEFT HEART CATH AND CORONARY ANGIOGRAPHY;  Surgeon: Teodoro Spray, MD;  Location: [facility];  Service: Cardiovascular;  Laterality: N/A;  . SHOULDER ARTHROSCOPY Left   . THYROID SURGERY Bilateral   . TUMOR REMOVAL Right    neck    Current Outpatient Medications  Medication Sig Dispense Refill  . allopurinol (ZYLOPRIM) 300 MG tablet Take 300 mg by mouth daily.  11  . aspirin EC 81 MG tablet Take 81 mg by mouth daily.    . colchicine 0.6 MG tablet Take 0.6 mg by mouth as needed.     Marland Kitchen levothyroxine (SYNTHROID) 125 MCG tablet Take 125 mcg by mouth daily before breakfast.     . metoprolol tartrate (LOPRESSOR) 25 MG tablet Take 12.5 mg by mouth 2 (two) times daily.    Marland Kitchen torsemide (DEMADEX) 10 MG tablet Take 10 mg by mouth daily as needed.    . traMADol (ULTRAM) 50 MG tablet Take 1-2 tablets (50-100 mg total) by mouth every 4 (four) hours as needed for moderate pain. 30 tablet 0  . traMADol (ULTRAM) 50 MG tablet Take 1 tablet (50 mg total) by mouth every 4 (four) hours  as needed for severe pain. 30 tablet 0  . losartan (COZAAR) 50 MG tablet Take 50 mg by mouth daily.    . ondansetron (ZOFRAN) 4 MG tablet Take 1 tablet (4 mg total) by mouth every 8 (eight) hours as needed for nausea or vomiting. (Patient not taking: Reported on 04/15/2019) 12 tablet 0   No current facility-administered medications for this visit.    Allergies  Allergen Reactions  . Oxycodone-Acetaminophen Hives, Rash, Shortness Of Breath and Swelling  . Strawberry Flavor Shortness Of Breath and Rash  . Aloe Vera Hives  . Amlodipine   . Hydrocodone Hives  . Latex     NEGATIVE BY IgE (<0.10)  . Morphine And Related Other (See Comments)    Nausea delusion   . Penicillins Hives    Has patient had a PCN reaction causing immediate rash, facial/tongue/throat swelling, SOB or lightheadedness with hypotension: Yes Has patient had a PCN reaction causing severe rash involving mucus membranes or skin necrosis: No Has patient had a PCN reaction that required hospitalization: No Has patient had a PCN reaction occurring within the last 10 years: No If all of the above answers are "NO", then may proceed with Cephalosporin use.  . Meperidine Rash    Indication: Patient presents with symptomatic varicose veins of the  left lower extremity.  Procedure: Foam sclerotherapy was performed on the left lower extremity. Using ultrasound guidance, 5 mL of foam Sotradecol was used to inject the varicosities of the left lower extremity. Compression wraps were placed. The patient tolerated the procedure well.

## 2019-06-30 DIAGNOSIS — R7309 Other abnormal glucose: Secondary | ICD-10-CM | POA: Diagnosis not present

## 2019-06-30 DIAGNOSIS — R0789 Other chest pain: Secondary | ICD-10-CM | POA: Diagnosis not present

## 2019-06-30 DIAGNOSIS — E079 Disorder of thyroid, unspecified: Secondary | ICD-10-CM | POA: Diagnosis not present

## 2019-06-30 DIAGNOSIS — Z79899 Other long term (current) drug therapy: Secondary | ICD-10-CM | POA: Diagnosis not present

## 2019-06-30 DIAGNOSIS — E7800 Pure hypercholesterolemia, unspecified: Secondary | ICD-10-CM | POA: Diagnosis not present

## 2019-06-30 DIAGNOSIS — R079 Chest pain, unspecified: Secondary | ICD-10-CM | POA: Diagnosis not present

## 2019-06-30 DIAGNOSIS — I10 Essential (primary) hypertension: Secondary | ICD-10-CM | POA: Diagnosis not present

## 2019-07-08 DIAGNOSIS — I10 Essential (primary) hypertension: Secondary | ICD-10-CM | POA: Diagnosis not present

## 2019-07-08 DIAGNOSIS — I6523 Occlusion and stenosis of bilateral carotid arteries: Secondary | ICD-10-CM | POA: Diagnosis not present

## 2019-07-08 DIAGNOSIS — I83813 Varicose veins of bilateral lower extremities with pain: Secondary | ICD-10-CM | POA: Diagnosis not present

## 2019-07-08 DIAGNOSIS — I350 Nonrheumatic aortic (valve) stenosis: Secondary | ICD-10-CM | POA: Diagnosis not present

## 2019-07-08 DIAGNOSIS — R079 Chest pain, unspecified: Secondary | ICD-10-CM | POA: Diagnosis not present

## 2019-07-22 DIAGNOSIS — H5203 Hypermetropia, bilateral: Secondary | ICD-10-CM | POA: Diagnosis not present

## 2019-07-22 DIAGNOSIS — Z0100 Encounter for examination of eyes and vision without abnormal findings: Secondary | ICD-10-CM | POA: Diagnosis not present

## 2019-08-19 DIAGNOSIS — I350 Nonrheumatic aortic (valve) stenosis: Secondary | ICD-10-CM | POA: Diagnosis not present

## 2019-08-19 DIAGNOSIS — R079 Chest pain, unspecified: Secondary | ICD-10-CM | POA: Diagnosis not present

## 2019-08-25 DIAGNOSIS — I6523 Occlusion and stenosis of bilateral carotid arteries: Secondary | ICD-10-CM | POA: Diagnosis not present

## 2019-08-25 DIAGNOSIS — I83813 Varicose veins of bilateral lower extremities with pain: Secondary | ICD-10-CM | POA: Diagnosis not present

## 2019-08-25 DIAGNOSIS — I10 Essential (primary) hypertension: Secondary | ICD-10-CM | POA: Diagnosis not present

## 2019-08-25 DIAGNOSIS — I350 Nonrheumatic aortic (valve) stenosis: Secondary | ICD-10-CM | POA: Diagnosis not present

## 2019-08-25 DIAGNOSIS — E7800 Pure hypercholesterolemia, unspecified: Secondary | ICD-10-CM | POA: Diagnosis not present

## 2019-10-08 ENCOUNTER — Other Ambulatory Visit: Payer: Self-pay | Admitting: Cardiology

## 2019-10-08 DIAGNOSIS — M542 Cervicalgia: Secondary | ICD-10-CM | POA: Diagnosis not present

## 2019-10-08 DIAGNOSIS — R0789 Other chest pain: Secondary | ICD-10-CM | POA: Diagnosis not present

## 2019-10-27 ENCOUNTER — Ambulatory Visit: Payer: Medicare HMO

## 2019-11-26 DIAGNOSIS — M542 Cervicalgia: Secondary | ICD-10-CM | POA: Diagnosis not present

## 2019-11-26 DIAGNOSIS — I10 Essential (primary) hypertension: Secondary | ICD-10-CM | POA: Diagnosis not present

## 2019-11-26 DIAGNOSIS — M25474 Effusion, right foot: Secondary | ICD-10-CM | POA: Diagnosis not present

## 2019-11-26 DIAGNOSIS — L97321 Non-pressure chronic ulcer of left ankle limited to breakdown of skin: Secondary | ICD-10-CM | POA: Diagnosis not present

## 2019-11-26 DIAGNOSIS — I872 Venous insufficiency (chronic) (peripheral): Secondary | ICD-10-CM | POA: Diagnosis not present

## 2019-11-26 DIAGNOSIS — I6523 Occlusion and stenosis of bilateral carotid arteries: Secondary | ICD-10-CM | POA: Diagnosis not present

## 2019-11-26 DIAGNOSIS — I350 Nonrheumatic aortic (valve) stenosis: Secondary | ICD-10-CM | POA: Diagnosis not present

## 2019-12-07 DIAGNOSIS — Z808 Family history of malignant neoplasm of other organs or systems: Secondary | ICD-10-CM | POA: Diagnosis not present

## 2019-12-07 DIAGNOSIS — I6523 Occlusion and stenosis of bilateral carotid arteries: Secondary | ICD-10-CM | POA: Diagnosis not present

## 2019-12-07 DIAGNOSIS — M7918 Myalgia, other site: Secondary | ICD-10-CM | POA: Diagnosis not present

## 2019-12-07 DIAGNOSIS — M5481 Occipital neuralgia: Secondary | ICD-10-CM | POA: Diagnosis not present

## 2019-12-15 ENCOUNTER — Telehealth (INDEPENDENT_AMBULATORY_CARE_PROVIDER_SITE_OTHER): Payer: Self-pay | Admitting: Vascular Surgery

## 2019-12-15 NOTE — Telephone Encounter (Signed)
Called to inform patient of NP's note & scheduled patient for listed studies

## 2019-12-15 NOTE — Telephone Encounter (Signed)
Hi [name] can you bring this patient in per [facility] for a Carotid and venous reflux please. Thank you. See notes below.

## 2019-12-15 NOTE — Telephone Encounter (Signed)
Called stating that she has a bulging vein on her left ankle/foot and that her neurologist and cardiologist suggested that she come in to be seen. Patient was last seen 05/05/19 Sclerotherapy (JD). Please advise.

## 2019-12-15 NOTE — Telephone Encounter (Signed)
Does patient need an u/s and/or just an appt to be seen ? Thank you

## 2019-12-15 NOTE — Telephone Encounter (Signed)
In reviewing the notes from neurology and cardiology, it seems that they were more concerned with the carotid bruit they heard than the varicose vein that she has.  If she can see it and feel it, it isn't dangerous but we can bring her in with both a venous reflux and carotid, as the schedule allows

## 2019-12-28 ENCOUNTER — Other Ambulatory Visit (INDEPENDENT_AMBULATORY_CARE_PROVIDER_SITE_OTHER): Payer: Self-pay | Admitting: Nurse Practitioner

## 2019-12-28 DIAGNOSIS — R0989 Other specified symptoms and signs involving the circulatory and respiratory systems: Secondary | ICD-10-CM

## 2019-12-28 DIAGNOSIS — I8392 Asymptomatic varicose veins of left lower extremity: Secondary | ICD-10-CM

## 2020-01-01 ENCOUNTER — Ambulatory Visit (INDEPENDENT_AMBULATORY_CARE_PROVIDER_SITE_OTHER): Payer: Medicare HMO

## 2020-01-01 ENCOUNTER — Ambulatory Visit (INDEPENDENT_AMBULATORY_CARE_PROVIDER_SITE_OTHER): Payer: Medicare HMO | Admitting: Nurse Practitioner

## 2020-01-01 ENCOUNTER — Other Ambulatory Visit: Payer: Self-pay

## 2020-01-01 DIAGNOSIS — I8392 Asymptomatic varicose veins of left lower extremity: Secondary | ICD-10-CM | POA: Diagnosis not present

## 2020-01-01 DIAGNOSIS — R0989 Other specified symptoms and signs involving the circulatory and respiratory systems: Secondary | ICD-10-CM | POA: Diagnosis not present

## 2020-01-05 ENCOUNTER — Ambulatory Visit (INDEPENDENT_AMBULATORY_CARE_PROVIDER_SITE_OTHER): Payer: Medicare HMO | Admitting: Vascular Surgery

## 2020-01-06 ENCOUNTER — Other Ambulatory Visit (INDEPENDENT_AMBULATORY_CARE_PROVIDER_SITE_OTHER): Payer: Self-pay | Admitting: Vascular Surgery

## 2020-01-06 DIAGNOSIS — I6523 Occlusion and stenosis of bilateral carotid arteries: Secondary | ICD-10-CM

## 2020-01-12 ENCOUNTER — Telehealth (INDEPENDENT_AMBULATORY_CARE_PROVIDER_SITE_OTHER): Payer: Self-pay

## 2020-01-18 ENCOUNTER — Other Ambulatory Visit: Payer: Self-pay

## 2020-01-18 ENCOUNTER — Ambulatory Visit (INDEPENDENT_AMBULATORY_CARE_PROVIDER_SITE_OTHER): Payer: Medicare HMO | Admitting: Nurse Practitioner

## 2020-01-18 VITALS — BP 170/78 | HR 67 | Ht 70.0 in | Wt 278.0 lb

## 2020-01-18 DIAGNOSIS — I890 Lymphedema, not elsewhere classified: Secondary | ICD-10-CM

## 2020-01-18 MED ORDER — DOXYCYCLINE HYCLATE 100 MG PO CAPS: 100.0000 mg | ORAL_CAPSULE | Freq: Two times a day (BID) | ORAL | 0 refills | Status: DC

## 2020-01-18 NOTE — Progress Notes (Signed)
History of Present Illness  There is no documented history at this time  Assessments & Plan   There are no diagnoses linked to this encounter.    Additional instructions  Subjective:  Patient presents with venous ulcer of the Left lower extremity.    Procedure:  3 layer unna wrap was placed Left lower extremity.   Plan:   Follow up in one week.  

## 2020-01-26 ENCOUNTER — Ambulatory Visit (INDEPENDENT_AMBULATORY_CARE_PROVIDER_SITE_OTHER): Payer: Medicare HMO | Admitting: Vascular Surgery

## 2020-01-26 ENCOUNTER — Other Ambulatory Visit: Payer: Self-pay

## 2020-01-26 VITALS — BP 162/93 | HR 59 | Resp 14 | Ht 70.0 in | Wt 277.0 lb

## 2020-01-26 DIAGNOSIS — L97321 Non-pressure chronic ulcer of left ankle limited to breakdown of skin: Secondary | ICD-10-CM

## 2020-01-26 NOTE — Progress Notes (Signed)
History of Present Illness  There is no documented history at this time  Assessments & Plan   There are no diagnoses linked to this encounter.    Additional instructions  Subjective:  Patient presents with venous ulcer of the Left lower extremity.    Procedure:  3 layer unna wrap was placed Left lower extremity.   Plan:   Follow up in one week.  

## 2020-01-27 ENCOUNTER — Encounter (INDEPENDENT_AMBULATORY_CARE_PROVIDER_SITE_OTHER): Payer: Medicare HMO

## 2020-02-01 ENCOUNTER — Other Ambulatory Visit (INDEPENDENT_AMBULATORY_CARE_PROVIDER_SITE_OTHER): Payer: Self-pay | Admitting: Nurse Practitioner

## 2020-02-01 NOTE — Telephone Encounter (Signed)
I contacted the patient and discussed her carotid ultrasound results with her.  The patient has a 1 to 39% stenosis of the bilateral internal carotid arteries.  The patient describes having pain with moving her neck and slight visual changes when she looks in a downward motion.  I discussed the natural progression of carotid artery stenosis and how typically pain and discomfort or visual changes are not caused by movement but rather by changes in plaque.  The changes that she describes is not consistent with carotid artery stenosis.  The patient has at upcoming MRI scheduled by her primary care provider in order to further evaluate for causes.  Patient is advised that she should move forward with with MRI to further evaluate causes but her chronic stenosis is minimal and will continue to be followed on an annual basis.

## 2020-02-03 ENCOUNTER — Other Ambulatory Visit: Payer: Self-pay

## 2020-02-03 ENCOUNTER — Encounter (INDEPENDENT_AMBULATORY_CARE_PROVIDER_SITE_OTHER): Payer: Self-pay | Admitting: Nurse Practitioner

## 2020-02-03 ENCOUNTER — Ambulatory Visit (INDEPENDENT_AMBULATORY_CARE_PROVIDER_SITE_OTHER): Payer: Medicare HMO | Admitting: Nurse Practitioner

## 2020-02-03 VITALS — BP 153/82 | HR 51 | Ht 70.0 in | Wt 277.0 lb

## 2020-02-03 DIAGNOSIS — L97321 Non-pressure chronic ulcer of left ankle limited to breakdown of skin: Secondary | ICD-10-CM

## 2020-02-03 NOTE — Progress Notes (Signed)
History of Present Illness  There is no documented history at this time  Assessments & Plan   There are no diagnoses linked to this encounter.    Additional instructions  Subjective:  Patient presents with venous ulcer of the Left lower extremity.    Procedure:  3 layer unna wrap was placed Bilateral lower extremity.   Plan:   Follow up in one week.

## 2020-02-10 ENCOUNTER — Ambulatory Visit (INDEPENDENT_AMBULATORY_CARE_PROVIDER_SITE_OTHER): Payer: Medicare HMO | Admitting: Nurse Practitioner

## 2020-02-11 ENCOUNTER — Encounter (INDEPENDENT_AMBULATORY_CARE_PROVIDER_SITE_OTHER): Payer: Self-pay | Admitting: Nurse Practitioner

## 2020-02-21 ENCOUNTER — Other Ambulatory Visit
Admission: RE | Admit: 2020-02-21 | Discharge: 2020-02-21 | Disposition: A | Discharge status: Home / Self Care | Payer: Medicare HMO | Source: Ambulatory Visit | Attending: Family Medicine | Admitting: Family Medicine

## 2020-02-21 DIAGNOSIS — R600 Localized edema: Secondary | ICD-10-CM | POA: Diagnosis present

## 2020-02-21 LAB — BRAIN NATRIURETIC PEPTIDE: B Natriuretic Peptide: 126.8 pg/mL — ABNORMAL HIGH (ref 0.0–100.0)

## 2020-03-10 ENCOUNTER — Other Ambulatory Visit: Payer: Self-pay | Admitting: Internal Medicine

## 2020-03-10 DIAGNOSIS — Z1231 Encounter for screening mammogram for malignant neoplasm of breast: Secondary | ICD-10-CM

## 2020-04-11 ENCOUNTER — Observation Stay
Admission: EM | Admit: 2020-04-11 | Discharge: 2020-04-12 | Disposition: A | Discharge status: Home / Self Care | Payer: Medicare HMO | Attending: Hospitalist | Admitting: Hospitalist

## 2020-04-11 ENCOUNTER — Emergency Department: Payer: Medicare HMO

## 2020-04-11 ENCOUNTER — Other Ambulatory Visit: Payer: Self-pay

## 2020-04-11 DIAGNOSIS — Z20822 Contact with and (suspected) exposure to covid-19: Secondary | ICD-10-CM | POA: Diagnosis not present

## 2020-04-11 DIAGNOSIS — Z880 Allergy status to penicillin: Secondary | ICD-10-CM | POA: Diagnosis not present

## 2020-04-11 DIAGNOSIS — Z7982 Long term (current) use of aspirin: Secondary | ICD-10-CM | POA: Diagnosis not present

## 2020-04-11 DIAGNOSIS — I700 Atherosclerosis of aorta: Secondary | ICD-10-CM | POA: Diagnosis not present

## 2020-04-11 DIAGNOSIS — Z79899 Other long term (current) drug therapy: Secondary | ICD-10-CM | POA: Insufficient documentation

## 2020-04-11 DIAGNOSIS — Z23 Encounter for immunization: Secondary | ICD-10-CM | POA: Diagnosis not present

## 2020-04-11 DIAGNOSIS — R55 Syncope and collapse: Secondary | ICD-10-CM

## 2020-04-11 DIAGNOSIS — E039 Hypothyroidism, unspecified: Secondary | ICD-10-CM | POA: Diagnosis not present

## 2020-04-11 DIAGNOSIS — I350 Nonrheumatic aortic (valve) stenosis: Secondary | ICD-10-CM

## 2020-04-11 DIAGNOSIS — I10 Essential (primary) hypertension: Secondary | ICD-10-CM | POA: Insufficient documentation

## 2020-04-11 DIAGNOSIS — I161 Hypertensive emergency: Secondary | ICD-10-CM | POA: Insufficient documentation

## 2020-04-11 DIAGNOSIS — R0789 Other chest pain: Principal | ICD-10-CM | POA: Insufficient documentation

## 2020-04-11 DIAGNOSIS — I872 Venous insufficiency (chronic) (peripheral): Secondary | ICD-10-CM | POA: Diagnosis present

## 2020-04-11 DIAGNOSIS — I160 Hypertensive urgency: Secondary | ICD-10-CM

## 2020-04-11 DIAGNOSIS — R42 Dizziness and giddiness: Secondary | ICD-10-CM | POA: Insufficient documentation

## 2020-04-11 DIAGNOSIS — R079 Chest pain, unspecified: Secondary | ICD-10-CM | POA: Diagnosis present

## 2020-04-11 DIAGNOSIS — R072 Precordial pain: Secondary | ICD-10-CM | POA: Diagnosis not present

## 2020-04-11 LAB — CREATININE, SERUM
Creatinine, Ser: 0.89 mg/dL (ref 0.44–1.00)
GFR, Estimated: >60 mL/min (ref >60)

## 2020-04-11 LAB — CBC
HCT: 44.1 % (ref 36.0–46.0)
HCT: 43 % (ref 36.0–46.0)
Hemoglobin: 14.4 g/dL (ref 12.0–15.0)
Hemoglobin: 14.1 g/dL (ref 12.0–15.0)
MCH: 30.3 pg (ref 26.0–34.0)
MCH: 30.4 pg (ref 26.0–34.0)
MCHC: 32.7 g/dL (ref 30.0–36.0)
MCHC: 32.8 g/dL (ref 30.0–36.0)
MCV: 92.8 fL (ref 80.0–100.0)
MCV: 92.7 fL (ref 80.0–100.0)
Platelets: 228 10*3/uL (ref 150–400)
Platelets: 218 10*3/uL (ref 150–400)
RBC: 4.75 MIL/uL (ref 3.87–5.11)
RBC: 4.64 MIL/uL (ref 3.87–5.11)
RDW: 13.8 % (ref 11.5–15.5)
RDW: 14 % (ref 11.5–15.5)
WBC: 9.9 10*3/uL (ref 4.0–10.5)
WBC: 9.9 10*3/uL (ref 4.0–10.5)
nRBC: 0 % (ref 0.0–0.2)
nRBC: 0 % (ref 0.0–0.2)

## 2020-04-11 LAB — BRAIN NATRIURETIC PEPTIDE: B Natriuretic Peptide: 79.2 pg/mL (ref 0.0–100.0)

## 2020-04-11 LAB — BASIC METABOLIC PANEL
Anion gap: 7 (ref 5–15)
BUN: 14 mg/dL (ref 8–23)
CO2: 27 mmol/L (ref 22–32)
Calcium: 9.4 mg/dL (ref 8.9–10.3)
Chloride: 105 mmol/L (ref 98–111)
Creatinine, Ser: 0.96 mg/dL (ref 0.44–1.00)
GFR, Estimated: >60 mL/min (ref >60)
Glucose, Bld: 132 mg/dL — ABNORMAL HIGH (ref 70–99)
Potassium: 3.7 mmol/L (ref 3.5–5.1)
Sodium: 139 mmol/L (ref 135–145)

## 2020-04-11 LAB — TROPONIN I (HIGH SENSITIVITY)
Troponin I (High Sensitivity): 10 ng/L (ref <18)
Troponin I (High Sensitivity): 10 ng/L (ref <18)

## 2020-04-11 MED ORDER — ONDANSETRON HCL 4 MG/2ML IJ SOLN: 4.0000 mg | Freq: Four times a day (QID) | INTRAMUSCULAR | Status: DC | PRN

## 2020-04-11 MED ORDER — HYDRALAZINE HCL 10 MG PO TABS
10.0000 mg | ORAL_TABLET | Freq: Three times a day (TID) | ORAL | Status: DC
  Administered 2020-04-11 – 2020-04-12 (×2): 10 mg via ORAL
  Filled 2020-04-11 (×4): qty 1

## 2020-04-11 MED ORDER — ENOXAPARIN SODIUM 60 MG/0.6ML ~~LOC~~ SOLN
60.0000 mg | SUBCUTANEOUS | Status: DC
  Administered 2020-04-11: 60 mg via SUBCUTANEOUS
  Filled 2020-04-11 (×2): qty 0.6

## 2020-04-11 MED ORDER — IOHEXOL 350 MG/ML SOLN
100.0000 mL | Freq: Once | INTRAVENOUS | Status: AC | PRN
  Administered 2020-04-11: 100 mL via INTRAVENOUS

## 2020-04-11 MED ORDER — HYDRALAZINE HCL 20 MG/ML IJ SOLN
5.0000 mg | Freq: Once | INTRAMUSCULAR | Status: AC
  Administered 2020-04-11: 5 mg via INTRAVENOUS
  Filled 2020-04-11: qty 1

## 2020-04-11 MED ORDER — METOPROLOL TARTRATE 25 MG PO TABS
12.5000 mg | ORAL_TABLET | Freq: Two times a day (BID) | ORAL | Status: DC
Start: 2020-04-11 — End: 2020-04-12
  Administered 2020-04-12: 12.5 mg via ORAL
  Filled 2020-04-11 (×2): qty 1

## 2020-04-11 MED ORDER — ALPRAZOLAM 0.25 MG PO TABS: 0.2500 mg | ORAL_TABLET | Freq: Two times a day (BID) | ORAL | Status: DC | PRN

## 2020-04-11 NOTE — ED Notes (Signed)
Report received from [facility]. Pt resting on stretcher in NAD. Denies needs at this time

## 2020-04-11 NOTE — H&P (Signed)
History and Physical    Brianna Dalton WUX:[id number] DOB: [birth year] DOA: 04/11/2020  PCP: Idelle Crouch, MD   Patient coming from: Home  I have personally briefly reviewed patient's old medical records in [place]  Chief Complaint: Chest pain, lightheadedness  HPI: Brianna Dalton is a 75 y.o. 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  States she felt like she was going to pass out and someone said her eyes rolled back in her head as she sat down.  Chest discomfort was of moderate intensity, nonradiating with no aggravating or alleviating factors ha mild shortness of breath.  She had no nausea, vomiting, no fever or chills, no abdominal pain or diarrhea. ED course: On arrival BP 170/51, pulse 56, respirations 13 with O2 sat 97% on room air.  Troponin negative x2 at 10/10.  BNP 79.  CBC and BMP WNL. EKG as interpreted by me: NSR at 84 with no acute ST-T wave changes Imaging: CTA chest/abdomen/pelvis: Negative for PE and no acute aortic abnormality.  Moderate aortic atherosclerosis with mild stenosis at the origin of the bilateral renal arteries due to atherosclerosis.  The ED provider spoke with patient's cardiologist, Dr. Ubaldo Glassing who agreed with bringing patient in for observation.  Hospitalist consulted for admission.  Review of Systems: As per HPI otherwise all other systems on review of systems negative.    Past Medical History:  Diagnosis Date  . Angina pectoris syndrome ([place])   . Arterial hemorrhage    Lt ankle possible venous bleed  . Arthritis   . Hypertension   . Hypothyroidism   . Neuropathy   .  Varicose vein of leg     Past Surgical History:  Procedure Laterality Date  . ABDOMINAL HYSTERECTOMY    . BREAST BIOPSY Right 01/19/2013   stereo biopsy of two areas neg  . CHOLECYSTECTOMY N/A 03/28/2019   Procedure: LAPAROSCOPIC CHOLECYSTECTOMY;  Surgeon: Olean Ree, MD;  Location: [facility];  Service: General;  Laterality: N/A;  . JOINT REPLACEMENT     lt knee  . KNEE ARTHROPLASTY Right 04/27/2015   Procedure: COMPUTER ASSISTED TOTAL KNEE ARTHROPLASTY;  Surgeon: Dereck Leep, MD;  Location: [facility];  Service: Orthopedics;  Laterality: Right;  . LEFT HEART CATH AND CORONARY ANGIOGRAPHY N/A 04/10/2017   Procedure: LEFT HEART CATH AND CORONARY ANGIOGRAPHY;  Surgeon: Teodoro Spray, MD;  Location: [facility];  Service: Cardiovascular;  Laterality: N/A;  . SHOULDER ARTHROSCOPY Left   . THYROID SURGERY Bilateral   . TUMOR REMOVAL Right    neck     reports that she has never smoked. She has never used smokeless tobacco. She reports that she does not drink alcohol and does not use drugs.  Allergies  Allergen Reactions  . Oxycodone-Acetaminophen Hives, Rash, Shortness Of Breath and Swelling  . Strawberry Flavor Shortness Of Breath and Rash  . Aloe Vera Hives  . Amlodipine   . Hydrocodone Hives  . Latex     NEGATIVE BY IgE (<0.10)  . Morphine And Related Other (See Comments)    Nausea delusion   . Penicillins Hives    Has patient had a PCN reaction causing immediate rash, facial/tongue/throat swelling, SOB or lightheadedness  with hypotension: Yes Has patient had a PCN reaction causing severe rash involving mucus membranes or skin necrosis: No Has patient had a PCN reaction that required hospitalization: No Has patient had a PCN reaction occurring within the last 10 years: No If all of the above answers are "NO", then may proceed with Cephalosporin use.  . Meperidine Rash    No family history on file.    Prior to Admission medications   Medication Sig Start Date  End Date Taking? Authorizing Provider  allopurinol (ZYLOPRIM) 300 MG tablet Take 300 mg by mouth daily. 03/11/17   [provider]  aspirin EC 81 MG tablet Take 81 mg by mouth daily.    [provider]  colchicine 0.6 MG tablet Take 0.6 mg by mouth as needed.     [provider]  doxycycline (VIBRAMYCIN) 100 MG capsule Take 1 capsule (100 mg total) by mouth 2 (two) times daily. 01/18/20   Kris Hartmann, NP  levothyroxine (SYNTHROID) 125 MCG tablet Take 125 mcg by mouth daily before breakfast.     [provider]  losartan (COZAAR) 50 MG tablet Take 50 mg by mouth daily. 03/14/19   [provider]  metoprolol tartrate (LOPRESSOR) 25 MG tablet Take 12.5 mg by mouth 2 (two) times daily.    [provider]  ondansetron (ZOFRAN) 4 MG tablet Take 1 tablet (4 mg total) by mouth every 8 (eight) hours as needed for nausea or vomiting. Patient not taking: Reported on 04/15/2019 03/29/19   Jennye Boroughs, MD  torsemide (DEMADEX) 10 MG tablet Take 10 mg by mouth daily as needed.    [provider]  traMADol (ULTRAM) 50 MG tablet Take 1-2 tablets (50-100 mg total) by mouth every 4 (four) hours as needed for moderate pain. 04/28/15   Watt Climes, PA    Physical Exam: Vitals:   04/11/20 1727 04/11/20 1752 04/11/20 1753 04/11/20 1830  BP: (!) 159/101   (!) 170/151  Pulse: 65   (!) 56  Resp: 14   13  Temp:  97.9 F (36.6 C)    TempSrc:  Oral    SpO2: 97%   97%  Weight:   120.2 kg   Height:   5\' 10"  (1.778 m)      Vitals:   04/11/20 1727 04/11/20 1752 04/11/20 1753 04/11/20 1830  BP: (!) 159/101   (!) 170/151  Pulse: 65   (!) 56  Resp: 14   13  Temp:  97.9 F (36.6 C)    TempSrc:  Oral    SpO2: 97%   97%  Weight:   120.2 kg   Height:   5\' 10"  (1.778 m)       Constitutional: Alert and oriented x 3 . Not in any apparent distress HEENT:      Head: Normocephalic and atraumatic.         Eyes: PERLA, EOMI, Conjunctivae are normal.  Sclera is non-icteric.       Mouth/Throat: Mucous membranes are moist.       Neck: Supple with no signs of meningismus. Cardiovascular: Regular rate and rhythm. No murmurs, gallops, or rubs. 2+ symmetrical distal pulses are present . No JVD. No LE edema Respiratory: Respiratory effort normal .Lungs sounds clear bilaterally. No wheezes, crackles, or rhonchi.  Gastrointestinal: Soft, non tender, and non distended with positive bowel sounds.  Genitourinary: No CVA tenderness. Musculoskeletal: Nontender with normal range of motion in all extremities. No cyanosis, or erythema of extremities. Neurologic:  Face is symmetric. Moving all extremities. No gross focal neurologic deficits . Skin: Skin is warm, dry.  No rash or ulcers Psychiatric: Mood and affect are normal    Labs on Admission: I have personally reviewed following labs and imaging studies  CBC: Recent Labs  Lab 04/11/20 1528  WBC 9.9  HGB 14.4  HCT 44.1  MCV 92.8  PLT 932   Basic Metabolic Panel: Recent Labs  Lab 04/11/20 1528  NA 139  K 3.7  CL 105  CO2 27  GLUCOSE 132*  BUN 14  CREATININE 0.96  CALCIUM 9.4   GFR: Estimated Creatinine Clearance: 73.5 mL/min (by C-G formula based on SCr of 0.96 mg/dL). Liver Function Tests: No results for input(s): AST, ALT, ALKPHOS, BILITOT, PROT, ALBUMIN in the last 168 hours. No results for input(s): LIPASE, AMYLASE in the last 168 hours. No results for input(s): AMMONIA in the last 168 hours. Coagulation Profile: No results for input(s): INR, PROTIME in the last 168 hours. Cardiac Enzymes: No results for input(s): CKTOTAL, CKMB, CKMBINDEX, TROPONINI in the last 168 hours. BNP (last 3 results) No results for input(s): PROBNP in the last 8760 hours. HbA1C: No results for input(s): HGBA1C in the last 72 hours. CBG: No results for input(s): GLUCAP in the last 168 hours. Lipid Profile: No results for input(s): CHOL, HDL, LDLCALC, TRIG, CHOLHDL, LDLDIRECT in the last 72  hours. Thyroid Function Tests: No results for input(s): TSH, T4TOTAL, FREET4, T3FREE, THYROIDAB in the last 72 hours. Anemia Panel: No results for input(s): VITAMINB12, FOLATE, FERRITIN, TIBC, IRON, RETICCTPCT in the last 72 hours. Urine analysis:    Component Value Date/Time   COLORURINE AMBER (A) 06/17/2018 1552   APPEARANCEUR CLOUDY (A) 06/17/2018 1552   APPEARANCEUR Clear 07/02/2013 1409   LABSPEC 1.030 06/17/2018 1552   LABSPEC 1.011 07/02/2013 1409   PHURINE 5.0 06/17/2018 1552   GLUCOSEU NEGATIVE 06/17/2018 1552   GLUCOSEU Negative 07/02/2013 1409   HGBUR NEGATIVE 06/17/2018 1552   BILIRUBINUR SMALL (A) 06/17/2018 1552   BILIRUBINUR Negative 07/02/2013 1409   KETONESUR NEGATIVE 06/17/2018 1552   PROTEINUR >=300 (A) 06/17/2018 1552   NITRITE NEGATIVE 06/17/2018 1552   LEUKOCYTESUR NEGATIVE 06/17/2018 1552   LEUKOCYTESUR Negative 07/02/2013 1409    Radiological Exams on Admission: DG Chest 2 View  Result Date: 04/11/2020 CLINICAL DATA:  Stabbing chest pain EXAM: CHEST - 2 VIEW COMPARISON:  03/27/2019 FINDINGS: Cardiac shadow is stable. Aortic calcifications are seen. Lungs are well aerated bilaterally. No focal infiltrate or effusion is noted. No acute bony abnormality is noted. Degenerative changes of the thoracic spine are seen. IMPRESSION: No acute abnormality noted. Electronically Signed   By: Inez Catalina M.D.   On: 04/11/2020 16:23   CT Angio Chest/Abd/Pel for Dissection W and/or Wo Contrast  Result Date: 04/11/2020 CLINICAL DATA:  Chest pain and back pain EXAM: CT ANGIOGRAPHY CHEST, ABDOMEN AND PELVIS TECHNIQUE: Non-contrast CT of the chest was initially obtained. Multidetector CT imaging through the chest, abdomen and pelvis was performed using the standard protocol during bolus administration of intravenous contrast. Multiplanar reconstructed images and MIPs were obtained and reviewed to evaluate the vascular anatomy. CONTRAST:  169mL OMNIPAQUE IOHEXOL 350 MG/ML SOLN  COMPARISON:  None. FINDINGS: CTA CHEST FINDINGS Cardiovascular: --Heart: The heart size is normal.  There is nopericardial effusion. --Aorta: The course and caliber of the thoracic aorta are normal. Scattered mild-to-moderate aortic atherosclerosis is noted. Aortic valve calcifications are seen. Coronary artery calcifications are noted. Precontrast images show no aortic intramural hematoma.  There is no blood pool, dissection or penetrating ulcer demonstrated on arterial phase postcontrast imaging. There is a conventional 3 vessel aortic arch branching pattern. The proximal arch vessels are widely patent. --Pulmonary Arteries: Contrast timing is optimized for preferential opacification of the aorta. Within that limitation, normal central pulmonary arteries. Mediastinum/Nodes: No mediastinal, hilar or axillary lymphadenopathy. The visualized thyroid and thoracic esophageal course are unremarkable. Lungs/Pleura: No pulmonary nodules or masses. No pleural effusion or pneumothorax. No focal airspace consolidation. No focal pleural abnormality. Musculoskeletal: No chest wall abnormality. No acute osseous findings. Review of the MIP images confirms the above findings. CTA ABDOMEN AND PELVIS FINDINGS VASCULAR Aorta: Normal caliber aorta without aneurysm, dissection, vasculitis or hemodynamically significant stenosis. There is scattered moderate aortic atherosclerosis. Celiac: No aneurysm, dissection or hemodynamically significant stenosis. Normal branching pattern SMA: Widely patent without dissection or stenosis. Renals: Single renal arteries bilaterally. Mild stenosis due to atherosclerosis seen at the origin of the renal arteries. IMA: Patent without abnormality. Inflow: No aneurysm, stenosis or dissection. Veins: Normal course and caliber of the major veins. Assessment is otherwise limited by the arterial dominant contrast phase. Review of the MIP images confirms the above findings. NON-VASCULAR Hepatobiliary: Normal  hepatic contours and density. No visible biliary dilatation. The patient is status post cholecystectomy. Pancreas: Normal contours without ductal dilatation. No peripancreatic fluid collection. Spleen: Normal arterial phase splenic enhancement pattern. Adrenals/Urinary Tract: --Adrenal glands: Normal. --Right kidney/ureter: No hydronephrosis or perinephric stranding. No nephrolithiasis. No obstructing ureteral stones. --Left kidney/ureter: No hydronephrosis or perinephric stranding. No nephrolithiasis. No obstructing ureteral stones. --Urinary bladder: Unremarkable. Stomach/Bowel: --Stomach/Duodenum: No hiatal hernia or other gastric abnormality. Normal duodenal course and caliber. --Small bowel: No dilatation or inflammation. --Colon: Scattered colonic diverticulosis is noted. There is a moderate amount of colonic stool. The appendix is unremarkable. Lymphatic:  No abdominal or pelvic lymphadenopathy. Reproductive: No free fluid in the pelvis. Musculoskeletal. No bony spinal canal stenosis or focal osseous abnormality. Anterior flowing osteophytes seen in the lower thoracic spine. Other: None. Review of the MIP images confirms the above findings. IMPRESSION: No acute aortic abnormality Moderate aortic atherosclerosis. Aortic Atherosclerosis (ICD10-I70.0). Coronary artery calcifications Mild stenosis at the origin of the bilateral renal arteries due to atherosclerosis Diverticulosis without diverticulitis Electronically Signed   By: Prudencio Pair M.D.   On: 04/11/2020 19:16     Assessment/Plan 75 year old female with history of HTN, hypothyroidism, moderate aortic stenosis on echo on 03/30/2020, treated a week ago for bronchitis with azithromycin, recent neck pain with nonsignificant carotid artery stenosis(1-39%), presenting with chest pain and a near syncopal event.      Chest pain -Patient reports chest pressure associated with diaphoresis.  Troponin was negative x2 at 10/10, EKG nonacute -CTA chest  negative for PE or acute aortic abnormality -Had left heart cath in March 2019 with normal coronaries -Had echocardiogram 03/30/2020 which showed moderate aortic stenosis, EF 50 to 55% -Continue home aspirin and metoprolol.  Not currently on statin -Nitroglycerin sublingual as needed chest pain to assess therapeutic benefit though suspicion for noncardiac chest pain -BP control -Cardiology consult    Hypertensive urgency -BP 178/75 in the ED -CTA chest abdomen pelvis with no acute aortic abnormality -Continue home losartan and metoprolol. Hydralazine added    Postural dizziness with presyncope -Patient had dizziness associated with chest pain and eyes" rolled back in her head' -CTA chest negative for PE -Had recent echocardiogram on 03/30/2020 so we will not repeat.  Showed moderate AAS and EF 50 to 55% -Had recent carotid  Doppler that showed bilateral carotid artery stenosis of 1 to 39% so will not repeat -Continuous cardiac monitoring " -Neurologic checks.  Check orthostatics    Aortic atherosclerosis on CTA ([place]) -Risk factor modification -Lipid profile.  Start statin if elevated    Adult hypothyroidism -Continue levothyroxine    Chronic venous insufficiency -Continue torsemide as needed    Moderate aortic stenosis -Recent echo as mentioned above on 03/30/2020    DVT prophylaxis: Lovenox  Code Status: full code  Family Communication:  none  Disposition Plan: Back to previous home environment Consults called: Cardiology Status: Observation    Athena Masse MD [facility]     04/11/2020, 8:32 PM

## 2020-04-11 NOTE — ED Triage Notes (Signed)
Pt here via EMS from work with c/o CP. Pt has hx of aortic narrowing. Pt states substernal CP radiate to left side of chest.  Pt states some nausea. Pt got 1 spray of nitro. Pt got 324 aspirin. Pt has 20g in left hand.

## 2020-04-11 NOTE — ED Notes (Signed)
Pt ambulatory to bedside toilet with steady gait and no assistance.

## 2020-04-11 NOTE — ED Provider Notes (Addendum)
[facility] Emergency Department Provider Note  ____________________________________________   Event Date/Time   First MD Initiated Contact with Patient 04/11/20 1716     (approximate)  I have reviewed the triage vital signs and the nursing notes.   HISTORY  Chief Complaint Chest Pain    HPI Brianna Dalton is a 75 y.o. female  With h/o HTN, moderate AV stenosis here with chest pain, ? Syncopal episodes. Pt reports that earlier today, she developed acute, aching, substernal chest pressure. Felt like a cinder block was being pushed into her. She felt SOB with this as well as lightheadedness. She reports that twice during these spells, she felt weak, lightheaded and her eyes "rolled back." Did not fully lose consciousness. She reports that she also feels SOB, worsening over last few weeks. Sx are mostly improved. No specific aggravating factors.        Past Medical History:  Diagnosis Date  . Angina pectoris syndrome ([place])   . Arterial hemorrhage    Lt ankle possible venous bleed  . Arthritis   . Hypertension   . Hypothyroidism   . Neuropathy   . Varicose vein of leg     Patient Active Problem List   Diagnosis Date Noted  . Hypertensive urgency 04/11/2020  . Aortic atherosclerosis ([place]) 04/11/2020  . Postural dizziness with presyncope 04/11/2020  . Bradycardia 04/14/2019  . Moderate aortic stenosis 04/14/2019  . Acute cholecystitis 03/29/2019  . Elevated troponin 03/27/2019  . Cholelithiasis 03/27/2019  . Lower limb ulcer, ankle, left, limited to breakdown of skin ([place]) 02/24/2019  . Varicose veins of lower extremity with pain, bilateral 10/31/2018  . Non-cardiac chest pain 10/28/2018  . Infectious diarrhea 09/03/2018  . Lymphedema 07/30/2018  . Chronic venous insufficiency 07/30/2018  . Swelling of limb 07/15/2018  . Chest pain 04/09/2017  . Pain and swelling of lower leg, right 03/21/2017  . Swelling of foot joint, right 03/11/2017  .  Encounter for long-term (current) use of high-risk medication 03/11/2017  . Acute gout of right foot 03/11/2017  . SOBOE (shortness of breath on exertion) 12/04/2016  . Bilateral carotid artery stenosis 12/04/2016  . Thyroid disease 09/15/2015  . Knee joint replaced by other means 06/12/2015  . 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 S/P total knee arthroplasty 04/27/2015  . Lumbar radiculopathy 07/07/2013  . Acid reflux 07/05/2013  . BP (high blood pressure) 07/05/2013  . HLD (hyperlipidemia) 07/05/2013  . Adult hypothyroidism 07/05/2013  . Morbid obesity ([place]) 07/05/2013  . Impaired renal function 07/05/2013  . Arthritis 07/05/2013  . Hyperlipidemia 07/05/2013    Past Surgical History:  Procedure Laterality Date  . ABDOMINAL HYSTERECTOMY    . BREAST BIOPSY Right 01/19/2013   stereo biopsy of two areas neg  . CHOLECYSTECTOMY N/A 03/28/2019   Procedure: LAPAROSCOPIC CHOLECYSTECTOMY;  Surgeon: Olean Ree, MD;  Location: [facility] ORS;  Service: General;  Laterality: N/A;  . JOINT REPLACEMENT     lt knee  . KNEE ARTHROPLASTY Right 04/27/2015   Procedure: COMPUTER ASSISTED TOTAL KNEE ARTHROPLASTY;  Surgeon: Dereck Leep, MD;  Location: [facility] ORS;  Service: Orthopedics;  Laterality: Right;  . LEFT HEART CATH AND CORONARY ANGIOGRAPHY N/A 04/10/2017   Procedure: LEFT HEART CATH AND CORONARY ANGIOGRAPHY;  Surgeon: Teodoro Spray, MD;  Location: [facility];  Service: Cardiovascular;  Laterality: N/A;  . SHOULDER ARTHROSCOPY Left   . THYROID SURGERY Bilateral   . TUMOR REMOVAL Right    neck    Prior to Admission  medications   Medication Sig Start Date End Date Taking? Authorizing Provider  allopurinol (ZYLOPRIM) 300 MG tablet Take 300 mg by mouth daily. 03/11/17   [provider]  aspirin EC 81 MG tablet Take 81 mg by mouth daily.    [provider]  colchicine 0.6 MG tablet Take 0.6 mg by mouth as needed.     [provider]  doxycycline (VIBRAMYCIN) 100 MG  capsule Take 1 capsule (100 mg total) by mouth 2 (two) times daily. 01/18/20   Kris Hartmann, NP  levothyroxine (SYNTHROID) 125 MCG tablet Take 125 mcg by mouth daily before breakfast.     [provider]  losartan (COZAAR) 50 MG tablet Take 50 mg by mouth daily. 03/14/19   [provider]  metoprolol tartrate (LOPRESSOR) 25 MG tablet Take 12.5 mg by mouth 2 (two) times daily.    [provider]  ondansetron (ZOFRAN) 4 MG tablet Take 1 tablet (4 mg total) by mouth every 8 (eight) hours as needed for nausea or vomiting. Patient not taking: Reported on 04/15/2019 03/29/19   Jennye Boroughs, MD  torsemide (DEMADEX) 10 MG tablet Take 10 mg by mouth daily as needed.    [provider]  traMADol (ULTRAM) 50 MG tablet Take 1-2 tablets (50-100 mg total) by mouth every 4 (four) hours as needed for moderate pain. 04/28/15   Watt Climes, PA    Allergies Oxycodone-acetaminophen, Strawberry flavor, Aloe vera, Amlodipine, Hydrocodone, Latex, Morphine and related, Penicillins, and Meperidine  No family history on file.  Social History Social History   Tobacco Use  . Smoking status: Never Smoker  . Smokeless tobacco: Never Used  Substance Use Topics  . Alcohol use: No  . Drug use: No    Review of Systems  Review of Systems  Constitutional: Positive for fatigue. Negative for chills and fever.  HENT: Negative for sore throat.   Respiratory: Positive for chest tightness and shortness of breath.   Cardiovascular: Positive for chest pain.  Gastrointestinal: Negative for abdominal pain.  Genitourinary: Negative for flank pain.  Musculoskeletal: Negative for neck pain.  Skin: Negative for rash and wound.  Allergic/Immunologic: Negative for immunocompromised state.  Neurological: Negative for weakness and numbness.  Hematological: Does not bruise/bleed easily.  All other systems reviewed and are negative.    ____________________________________________  PHYSICAL  EXAM:      VITAL SIGNS: ED Triage Vitals  Enc Vitals Group     BP 04/11/20 1727 (!) 159/101     Pulse Rate 04/11/20 1727 65     Resp 04/11/20 1727 14     Temp 04/11/20 1752 97.9 F (36.6 C)     Temp Source 04/11/20 1752 Oral     SpO2 04/11/20 1727 97 %     Weight 04/11/20 1753 265 lb (120.2 kg)     Height 04/11/20 1753 5\' 10"  (1.778 m)     Head Circumference --      Peak Flow --      Pain Score 04/11/20 1519 5     Pain Loc --      Pain Edu? --      Excl. in [place]? --      Physical Exam Vitals and nursing note reviewed.  Constitutional:      General: She is not in acute distress.    Appearance: She is well-developed.  HENT:     Head: Normocephalic and atraumatic.  Eyes:     Conjunctiva/sclera: Conjunctivae normal.  Cardiovascular:  Rate and Rhythm: Normal rate and regular rhythm.     Heart sounds: Murmur heard.   Crescendo decrescendo systolic murmur is present with a grade of 2/6.   Pulmonary:     Effort: Pulmonary effort is normal. No respiratory distress.     Breath sounds: No wheezing.  Abdominal:     General: There is no distension.  Musculoskeletal:     Cervical back: Neck supple.  Skin:    General: Skin is warm.     Capillary Refill: Capillary refill takes less than 2 seconds.     Findings: No rash.  Neurological:     Mental Status: She is alert and oriented to person, place, and time.     Motor: No abnormal muscle tone.       ____________________________________________   LABS (all labs ordered are listed, but only abnormal results are displayed)  Labs Reviewed  BASIC METABOLIC PANEL - Abnormal; Notable for the following components:      Result Value   Glucose, Bld 132 (*)    All other components within normal limits  CBC  BRAIN NATRIURETIC PEPTIDE  CBC  CREATININE, SERUM  TROPONIN I (HIGH SENSITIVITY)  TROPONIN I (HIGH SENSITIVITY)    ____________________________________________  EKG: Normal sinus rhythm, VR 84. PR 138, QRS 90, QTc  449. No acute ST elevation or depressions. No ischemia or infarct. ________________________________________  RADIOLOGY All imaging, including plain films, CT scans, and ultrasounds, independently reviewed by me, and interpretations confirmed via formal radiology reads.  ED MD interpretation:   CXR: No acute abnormality CT Angio C/A/P: No dissection  Official radiology report(s): DG Chest 2 View  Result Date: 04/11/2020 CLINICAL DATA:  Stabbing chest pain EXAM: CHEST - 2 VIEW COMPARISON:  03/27/2019 FINDINGS: Cardiac shadow is stable. Aortic calcifications are seen. Lungs are well aerated bilaterally. No focal infiltrate or effusion is noted. No acute bony abnormality is noted. Degenerative changes of the thoracic spine are seen. IMPRESSION: No acute abnormality noted. Electronically Signed   By: Inez Catalina M.D.   On: 04/11/2020 16:23   CT Angio Chest/Abd/Pel for Dissection W and/or Wo Contrast  Result Date: 04/11/2020 CLINICAL DATA:  Chest pain and back pain EXAM: CT ANGIOGRAPHY CHEST, ABDOMEN AND PELVIS TECHNIQUE: Non-contrast CT of the chest was initially obtained. Multidetector CT imaging through the chest, abdomen and pelvis was performed using the standard protocol during bolus administration of intravenous contrast. Multiplanar reconstructed images and MIPs were obtained and reviewed to evaluate the vascular anatomy. CONTRAST:  163mL OMNIPAQUE IOHEXOL 350 MG/ML SOLN COMPARISON:  None. FINDINGS: CTA CHEST FINDINGS Cardiovascular: --Heart: The heart size is normal.  There is nopericardial effusion. --Aorta: The course and caliber of the thoracic aorta are normal. Scattered mild-to-moderate aortic atherosclerosis is noted. Aortic valve calcifications are seen. Coronary artery calcifications are noted. Precontrast images show no aortic intramural hematoma. There is no blood pool, dissection or penetrating ulcer demonstrated on arterial phase postcontrast imaging. There is a conventional 3 vessel  aortic arch branching pattern. The proximal arch vessels are widely patent. --Pulmonary Arteries: Contrast timing is optimized for preferential opacification of the aorta. Within that limitation, normal central pulmonary arteries. Mediastinum/Nodes: No mediastinal, hilar or axillary lymphadenopathy. The visualized thyroid and thoracic esophageal course are unremarkable. Lungs/Pleura: No pulmonary nodules or masses. No pleural effusion or pneumothorax. No focal airspace consolidation. No focal pleural abnormality. Musculoskeletal: No chest wall abnormality. No acute osseous findings. Review of the MIP images confirms the above findings. CTA ABDOMEN AND PELVIS FINDINGS VASCULAR Aorta:  Normal caliber aorta without aneurysm, dissection, vasculitis or hemodynamically significant stenosis. There is scattered moderate aortic atherosclerosis. Celiac: No aneurysm, dissection or hemodynamically significant stenosis. Normal branching pattern SMA: Widely patent without dissection or stenosis. Renals: Single renal arteries bilaterally. Mild stenosis due to atherosclerosis seen at the origin of the renal arteries. IMA: Patent without abnormality. Inflow: No aneurysm, stenosis or dissection. Veins: Normal course and caliber of the major veins. Assessment is otherwise limited by the arterial dominant contrast phase. Review of the MIP images confirms the above findings. NON-VASCULAR Hepatobiliary: Normal hepatic contours and density. No visible biliary dilatation. The patient is status post cholecystectomy. Pancreas: Normal contours without ductal dilatation. No peripancreatic fluid collection. Spleen: Normal arterial phase splenic enhancement pattern. Adrenals/Urinary Tract: --Adrenal glands: Normal. --Right kidney/ureter: No hydronephrosis or perinephric stranding. No nephrolithiasis. No obstructing ureteral stones. --Left kidney/ureter: No hydronephrosis or perinephric stranding. No nephrolithiasis. No obstructing ureteral  stones. --Urinary bladder: Unremarkable. Stomach/Bowel: --Stomach/Duodenum: No hiatal hernia or other gastric abnormality. Normal duodenal course and caliber. --Small bowel: No dilatation or inflammation. --Colon: Scattered colonic diverticulosis is noted. There is a moderate amount of colonic stool. The appendix is unremarkable. Lymphatic:  No abdominal or pelvic lymphadenopathy. Reproductive: No free fluid in the pelvis. Musculoskeletal. No bony spinal canal stenosis or focal osseous abnormality. Anterior flowing osteophytes seen in the lower thoracic spine. Other: None. Review of the MIP images confirms the above findings. IMPRESSION: No acute aortic abnormality Moderate aortic atherosclerosis. Aortic Atherosclerosis (ICD10-I70.0). Coronary artery calcifications Mild stenosis at the origin of the bilateral renal arteries due to atherosclerosis Diverticulosis without diverticulitis Electronically Signed   By: Prudencio Pair M.D.   On: 04/11/2020 19:16    ____________________________________________  PROCEDURES   Procedure(s) performed (including Critical Care):  .1-3 Lead EKG Interpretation Performed by: Duffy Bruce, MD Authorized by: Duffy Bruce, MD     Interpretation: normal     ECG rate:  60-80s   ECG rate assessment: normal     Rhythm: sinus rhythm     Ectopy: none     Conduction: normal   Comments:     Indication: Chest pain    ____________________________________________  INITIAL IMPRESSION / MDM / [place] / ED COURSE  As part of my medical decision making, I reviewed the following data within the [place] notes reviewed and incorporated, Old chart reviewed, Notes from prior ED visits, and [place] Controlled Substance Database       *KIMMY TOTTEN was evaluated in Emergency Department on 04/11/2020 for the symptoms described in the history of present illness. She was evaluated in the context of the global COVID-19 pandemic, which  necessitated consideration that the patient might be at risk for infection with the SARS-CoV-2 virus that causes COVID-19. Institutional protocols and algorithms that pertain to the evaluation of patients at risk for COVID-19 are in a state of rapid change based on information released by regulatory bodies including the CDC and federal and state organizations. These policies and algorithms were followed during the patient's care in the ED.  Some ED evaluations and interventions may be delayed as a result of limited staffing during the pandemic.*     Medical Decision Making:  75 yo F with h/o HTN, moderate AS here with chest pain, possible near syncopal episodes. EKG on arrival shows NSR, no ischemia. Trop negative. CXR is clear. CT angio obtained 2/2 her CP, severe pain, severe HTN - reviewed, shows no dissection. Moderate athersclerosis noted Labs otherwise reassuring. Discussed with  Dr. Ubaldo Glassing, pt's cardiologist. Pt had only moderate AS on recent echo, unlikely to be causing her sx but given her ongoing cp, will admit for high risk cp obs, trending.  ____________________________________________  FINAL CLINICAL IMPRESSION(S) / ED DIAGNOSES  Final diagnoses:  Atypical chest pain  Nonrheumatic aortic valve stenosis     MEDICATIONS GIVEN DURING THIS VISIT:  Medications  ondansetron (ZOFRAN) injection 4 mg (has no administration in time range)  enoxaparin (LOVENOX) injection 60 mg (has no administration in time range)  ALPRAZolam (XANAX) tablet 0.25 mg (has no administration in time range)  metoprolol tartrate (LOPRESSOR) tablet 12.5 mg (has no administration in time range)  hydrALAZINE (APRESOLINE) tablet 10 mg (has no administration in time range)  iohexol (OMNIPAQUE) 350 MG/ML injection 100 mL (100 mLs Intravenous Contrast Given 04/11/20 1840)  hydrALAZINE (APRESOLINE) injection 5 mg (5 mg Intravenous Given 04/11/20 2111)     ED Discharge Orders    None       Note:  This document was  prepared using Dragon voice recognition software and may include unintentional dictation errors.   Duffy Bruce, MD 04/11/20 Octavia Heir    Duffy Bruce, MD 04/11/20 [phone]

## 2020-04-11 NOTE — Progress Notes (Signed)
PHARMACIST - PHYSICIAN COMMUNICATION  CONCERNING:  Enoxaparin (Lovenox) for DVT Prophylaxis   DESCRIPTION: Patient was prescribed enoxaprin 40mg  q24 hours for VTE prophylaxis.   Filed Weights   04/11/20 1753  Weight: 120.2 kg (265 lb)    Body mass index is 38.02 kg/m.  Estimated Creatinine Clearance: 79.3 mL/min (by C-G formula based on SCr of 0.89 mg/dL).   Based on [place] patient is candidate for enoxaparin 0.5mg /kg TBW SQ every 24 hours based on BMI being >30.  RECOMMENDATION: Pharmacy has adjusted enoxaparin dose per [facility] policy.  Patient is now receiving enoxaparin 60 mg every 24 hours    Darnelle Bos, PharmD Clinical Pharmacist  04/11/2020 9:39 PM

## 2020-04-11 NOTE — ED Notes (Addendum)
Pt presents to the ED via EMS. Pt states he was at work today and all of a sudden had intense, centralized CP, pt describes as a pressure/heaviness. Pt states that she became diaphoretic and said that her coworkers told her both of her eyes receded to the back of her head. Pt has had vascular issues in the past and is seen by Dr. Ubaldo Glassing and Dr. Lucky Cowboy. Pt states that she has previously had intermittent CP with some SOB but not this intense. Per pt, she also said her BP has been running high recently between 957-473 systolic. Pt is A&Ox4 and NAD.

## 2020-04-11 NOTE — ED Notes (Addendum)
Orthostatic VS:  Supine 141/71 HR 67 RR 19 97% Sitting 146/74 HR 78 RR 18 100% Standing 143/83 HR 73 RR 22 98%

## 2020-04-12 ENCOUNTER — Encounter: Payer: Self-pay | Admitting: Internal Medicine

## 2020-04-12 DIAGNOSIS — R072 Precordial pain: Secondary | ICD-10-CM | POA: Diagnosis not present

## 2020-04-12 LAB — RESP PANEL BY RT-PCR (FLU A&B, COVID) ARPGX2
Influenza A by PCR: NEGATIVE
Influenza B by PCR: NEGATIVE
SARS Coronavirus 2 by RT PCR: NEGATIVE

## 2020-04-12 MED ORDER — PNEUMOCOCCAL VAC POLYVALENT 25 MCG/0.5ML IJ INJ
0.5000 mL | INJECTION | INTRAMUSCULAR | Status: AC
  Administered 2020-04-12: 0.5 mL via INTRAMUSCULAR
  Filled 2020-04-12: qty 0.5

## 2020-04-12 MED ORDER — PNEUMOCOCCAL VAC POLYVALENT 25 MCG/0.5ML IJ INJ: 0.5000 mL | INJECTION | INTRAMUSCULAR | Status: DC

## 2020-04-12 MED ORDER — SPIRONOLACTONE 25 MG PO TABS: 25.0000 mg | ORAL_TABLET | Freq: Every day | ORAL | 2 refills | Status: DC

## 2020-04-12 MED ORDER — IBUPROFEN 400 MG PO TABS
400.0000 mg | ORAL_TABLET | Freq: Once | ORAL | Status: AC
  Administered 2020-04-12: 400 mg via ORAL
  Filled 2020-04-12: qty 1

## 2020-04-12 MED ORDER — PNEUMOCOCCAL VAC POLYVALENT 25 MCG/0.5ML IJ INJ: 0.5000 mL | INJECTION | INTRAMUSCULAR | 0 refills | Status: AC

## 2020-04-12 MED ORDER — LOSARTAN POTASSIUM 50 MG PO TABS: ORAL_TABLET | ORAL | Status: DC

## 2020-04-12 MED ORDER — SPIRONOLACTONE 25 MG PO TABS
25.0000 mg | ORAL_TABLET | Freq: Every day | ORAL | Status: DC
  Administered 2020-04-12: 25 mg via ORAL
  Filled 2020-04-12: qty 1

## 2020-04-12 NOTE — Consult Note (Signed)
Cardiology Consultation Note    Patient ID: MAVI UN, MRN: [id number], DOB/AGE: 31-Oct-1945 75 y.o. Admit date: 04/11/2020   Date of Consult: 04/12/2020 Primary Physician: Idelle Crouch, MD Primary Cardiologist: Dr. Ubaldo Glassing  Chief Complaint: chest pain/sob/dizziness Reason for Consultation: as Requesting MD: Dr. Billie Ruddy  HPI: Brianna Dalton is a 75 y.o. female with history of hypertension, moderate aortic stenosis by echo last week who presented to the emergency room with chest pain heaviness and lightheadedness.  She thought she was going to pass out.  Her peak velocity across the aortic valve was 304.1 cm/s with a mean gradient of 19.4 mmHg.  She ruled out for myocardial infarction with serial troponins of 10.  BNP is normal at 79.  Hemoglobin is normal.  Renal function is normal.  Blood pressure was markedly elevated on presentation, patient was quite anxious.  She states that while she was at work started having stabbing episodes of chest pain with made her feel poorly.  She noted her pressure was high when she got home.  She presented to emergency room where she had significantly elevated systolic blood pressure.  Again we will know for myocardial infarction no evidence of heart failure no evidence of ischemia.  EKG showed sinus rhythm with no ischemia.  She has no further chest pain and blood pressure is improved morning.  Past Medical History:  Diagnosis Date  . Angina pectoris syndrome ([place])   . Arterial hemorrhage    Lt ankle possible venous bleed  . Arthritis   . Hypertension   . Hypothyroidism   . Neuropathy   . Varicose vein of leg       Surgical History:  Past Surgical History:  Procedure Laterality Date  . ABDOMINAL HYSTERECTOMY    . BREAST BIOPSY Right 01/19/2013   stereo biopsy of two areas neg  . CHOLECYSTECTOMY N/A 03/28/2019   Procedure: LAPAROSCOPIC CHOLECYSTECTOMY;  Surgeon: Olean Ree, MD;  Location: [facility];  Service: General;  Laterality: N/A;  . JOINT  REPLACEMENT     lt knee  . KNEE ARTHROPLASTY Right 04/27/2015   Procedure: COMPUTER ASSISTED TOTAL KNEE ARTHROPLASTY;  Surgeon: Dereck Leep, MD;  Location: [facility];  Service: Orthopedics;  Laterality: Right;  . LEFT HEART CATH AND CORONARY ANGIOGRAPHY N/A 04/10/2017   Procedure: LEFT HEART CATH AND CORONARY ANGIOGRAPHY;  Surgeon: Teodoro Spray, MD;  Location: [facility];  Service: Cardiovascular;  Laterality: N/A;  . SHOULDER ARTHROSCOPY Left   . THYROID SURGERY Bilateral   . TUMOR REMOVAL Right    neck     Home Meds: Prior to Admission medications   Medication Sig Start Date End Date Taking? Authorizing Provider  allopurinol (ZYLOPRIM) 300 MG tablet Take 300 mg by mouth daily. 03/11/17   [provider]  aspirin EC 81 MG tablet Take 81 mg by mouth daily.    [provider]  colchicine 0.6 MG tablet Take 0.6 mg by mouth as needed.     [provider]  doxycycline (VIBRAMYCIN) 100 MG capsule Take 1 capsule (100 mg total) by mouth 2 (two) times daily. 01/18/20   Kris Hartmann, NP  levothyroxine (SYNTHROID) 125 MCG tablet Take 125 mcg by mouth daily before breakfast.     [provider]  losartan (COZAAR) 50 MG tablet Take 50 mg by mouth daily. 03/14/19   [provider]  metoprolol tartrate (LOPRESSOR) 25 MG tablet Take 12.5 mg by mouth 2 (two) times daily.  [provider]  ondansetron (ZOFRAN) 4 MG tablet Take 1 tablet (4 mg total) by mouth every 8 (eight) hours as needed for nausea or vomiting. Patient not taking: Reported on 04/15/2019 03/29/19   Jennye Boroughs, MD  torsemide (DEMADEX) 10 MG tablet Take 10 mg by mouth daily as needed.    [provider]  traMADol (ULTRAM) 50 MG tablet Take 1-2 tablets (50-100 mg total) by mouth every 4 (four) hours as needed for moderate pain. 04/28/15   Watt Climes, PA    Inpatient Medications:  . enoxaparin (LOVENOX) injection  60 mg Subcutaneous Q24H  . hydrALAZINE  10 mg  Oral Q8H  . metoprolol tartrate  12.5 mg Oral BID  . [START ON 04/13/2020] pneumococcal 23 valent vaccine  0.5 mL Intramuscular Tomorrow-1000     Allergies:  Allergies  Allergen Reactions  . Oxycodone-Acetaminophen Hives, Rash, Shortness Of Breath and Swelling  . Strawberry Flavor Shortness Of Breath and Rash  . Aloe Vera Hives  . Amlodipine   . Hydrocodone Hives  . Latex     NEGATIVE BY IgE (<0.10)  . Morphine And Related Other (See Comments)    Nausea delusion   . Penicillins Hives    Has patient had a PCN reaction causing immediate rash, facial/tongue/throat swelling, SOB or lightheadedness with hypotension: Yes Has patient had a PCN reaction causing severe rash involving mucus membranes or skin necrosis: No Has patient had a PCN reaction that required hospitalization: No Has patient had a PCN reaction occurring within the last 10 years: No If all of the above answers are "NO", then may proceed with Cephalosporin use.  . Meperidine Rash    Social History   Socioeconomic History  . Marital status: Divorced    Spouse name: Not on file  . Number of children: Not on file  . Years of education: Not on file  . Highest education level: Not on file  Occupational History  . Not on file  Tobacco Use  . Smoking status: Never Smoker  . Smokeless tobacco: Never Used  Substance and Sexual Activity  . Alcohol use: No  . Drug use: No  . Sexual activity: Never  Other Topics Concern  . Not on file  Social History Narrative  . Not on file   Social Determinants of Health   Financial Resource Strain: Not on file  Food Insecurity: Not on file  Transportation Needs: Not on file  Physical Activity: Not on file  Stress: Not on file  Social Connections: Not on file  Intimate Partner Violence: Not on file     History reviewed. No pertinent family history.   Review of Systems: A 12-system review of systems was performed and is negative except as noted in the HPI.  Labs: No  results for input(s): CKTOTAL, CKMB, TROPONINI in the last 72 hours. Lab Results  Component Value Date   WBC 9.9 04/11/2020   HGB 14.1 04/11/2020   HCT 43.0 04/11/2020   MCV 92.7 04/11/2020   PLT 218 04/11/2020    Recent Labs  Lab 04/11/20 1528 04/11/20 2112  NA 139  --   K 3.7  --   CL 105  --   CO2 27  --   BUN 14  --   CREATININE 0.96 0.89  CALCIUM 9.4  --   GLUCOSE 132*  --    No results found for: CHOL, HDL, LDLCALC, TRIG No results found for: DDIMER  Radiology/Studies:  DG Chest 2 View  Result Date:  04/11/2020 CLINICAL DATA:  Stabbing chest pain EXAM: CHEST - 2 VIEW COMPARISON:  03/27/2019 FINDINGS: Cardiac shadow is stable. Aortic calcifications are seen. Lungs are well aerated bilaterally. No focal infiltrate or effusion is noted. No acute bony abnormality is noted. Degenerative changes of the thoracic spine are seen. IMPRESSION: No acute abnormality noted. Electronically Signed   By: Inez Catalina M.D.   On: 04/11/2020 16:23   CT Angio Chest/Abd/Pel for Dissection W and/or Wo Contrast  Result Date: 04/11/2020 CLINICAL DATA:  Chest pain and back pain EXAM: CT ANGIOGRAPHY CHEST, ABDOMEN AND PELVIS TECHNIQUE: Non-contrast CT of the chest was initially obtained. Multidetector CT imaging through the chest, abdomen and pelvis was performed using the standard protocol during bolus administration of intravenous contrast. Multiplanar reconstructed images and MIPs were obtained and reviewed to evaluate the vascular anatomy. CONTRAST:  172mL OMNIPAQUE IOHEXOL 350 MG/ML SOLN COMPARISON:  None. FINDINGS: CTA CHEST FINDINGS Cardiovascular: --Heart: The heart size is normal.  There is nopericardial effusion. --Aorta: The course and caliber of the thoracic aorta are normal. Scattered mild-to-moderate aortic atherosclerosis is noted. Aortic valve calcifications are seen. Coronary artery calcifications are noted. Precontrast images show no aortic intramural hematoma. There is no blood pool,  dissection or penetrating ulcer demonstrated on arterial phase postcontrast imaging. There is a conventional 3 vessel aortic arch branching pattern. The proximal arch vessels are widely patent. --Pulmonary Arteries: Contrast timing is optimized for preferential opacification of the aorta. Within that limitation, normal central pulmonary arteries. Mediastinum/Nodes: No mediastinal, hilar or axillary lymphadenopathy. The visualized thyroid and thoracic esophageal course are unremarkable. Lungs/Pleura: No pulmonary nodules or masses. No pleural effusion or pneumothorax. No focal airspace consolidation. No focal pleural abnormality. Musculoskeletal: No chest wall abnormality. No acute osseous findings. Review of the MIP images confirms the above findings. CTA ABDOMEN AND PELVIS FINDINGS VASCULAR Aorta: Normal caliber aorta without aneurysm, dissection, vasculitis or hemodynamically significant stenosis. There is scattered moderate aortic atherosclerosis. Celiac: No aneurysm, dissection or hemodynamically significant stenosis. Normal branching pattern SMA: Widely patent without dissection or stenosis. Renals: Single renal arteries bilaterally. Mild stenosis due to atherosclerosis seen at the origin of the renal arteries. IMA: Patent without abnormality. Inflow: No aneurysm, stenosis or dissection. Veins: Normal course and caliber of the major veins. Assessment is otherwise limited by the arterial dominant contrast phase. Review of the MIP images confirms the above findings. NON-VASCULAR Hepatobiliary: Normal hepatic contours and density. No visible biliary dilatation. The patient is status post cholecystectomy. Pancreas: Normal contours without ductal dilatation. No peripancreatic fluid collection. Spleen: Normal arterial phase splenic enhancement pattern. Adrenals/Urinary Tract: --Adrenal glands: Normal. --Right kidney/ureter: No hydronephrosis or perinephric stranding. No nephrolithiasis. No obstructing ureteral  stones. --Left kidney/ureter: No hydronephrosis or perinephric stranding. No nephrolithiasis. No obstructing ureteral stones. --Urinary bladder: Unremarkable. Stomach/Bowel: --Stomach/Duodenum: No hiatal hernia or other gastric abnormality. Normal duodenal course and caliber. --Small bowel: No dilatation or inflammation. --Colon: Scattered colonic diverticulosis is noted. There is a moderate amount of colonic stool. The appendix is unremarkable. Lymphatic:  No abdominal or pelvic lymphadenopathy. Reproductive: No free fluid in the pelvis. Musculoskeletal. No bony spinal canal stenosis or focal osseous abnormality. Anterior flowing osteophytes seen in the lower thoracic spine. Other: None. Review of the MIP images confirms the above findings. IMPRESSION: No acute aortic abnormality Moderate aortic atherosclerosis. Aortic Atherosclerosis (ICD10-I70.0). Coronary artery calcifications Mild stenosis at the origin of the bilateral renal arteries due to atherosclerosis Diverticulosis without diverticulitis Electronically Signed   By: Prudencio Pair M.D.   On:  04/11/2020 19:16    Wt Readings from Last 3 Encounters:  04/12/20 122.8 kg  02/03/20 125.6 kg  01/26/20 125.6 kg    EKG: Normal sinus rhythm with no ischemia  Physical Exam:  Blood pressure (!) 175/77, pulse 67, temperature 97.8 F (36.6 C), temperature source Oral, resp. rate 18, height 5\' 10"  (1.778 m), weight 122.8 kg, SpO2 99 %. Body mass index is 38.84 kg/m. General: Well developed, well nourished, in no acute distress. Head: Normocephalic, atraumatic, sclera non-icteric, no xanthomas, nares are without discharge.  Neck: Negative for carotid bruits. JVD not elevated. Lungs: Clear bilaterally to auscultation without wheezes, rales, or rhonchi. Breathing is unlabored. Heart: RRR with S1 S2. 3/6 systolic murmur radiating to outflow.  Abdomen: Soft, non-tender, non-distended with normoactive bowel sounds. No hepatomegaly. No rebound/guarding. No  obvious abdominal masses. Msk:  Strength and tone appear normal for age. Extremities: No clubbing or cyanosis. No edema.  Distal pedal pulses are 2+ and equal bilaterally. Neuro: Alert and oriented X 3. No facial asymmetry. No focal deficit. Moves all extremities spontaneously. Psych:  Responds to questions appropriately with a normal affect.     Assessment and Plan  75 year old female with history of moderate aortic stenosis by echo last week who developed brief stabbing chest pains at work with also being noted to have significant elevated systolic blood pressure.  She presented to the emergency room.  She has ruled out for myocardial infarction.  No evidence of congestive heart failure.  No evidence of ischemia.  Blood pressure is improved this morning is 141/73 pulse of 58.  Chest CT showed no acute process.  1.  Aortic stenosis-moderate.  Not a candidate for aortic valve replacement at present.  We will continue to follow with serial echoes.  No evidence of heart failure no evidence of ischemia.  2.  Accelerated hypertension-likely driven somewhat by stress and anxiety however will need to continue to treat.  Pressure is better this morning.  No neurologic sequela I.  We will continue with current regimen including hydralazine and spironolactone.  We will continue with metoprolol at low-dose due to bradycardia.  Ambulate today.  If stable consider discharge with outpatient follow-up.  Signed, Teodoro Spray MD 04/12/2020, 7:56 AM Pager: [phone]

## 2020-04-12 NOTE — ED Notes (Signed)
Pt transported to floor by EDT Beth at this time.

## 2020-04-12 NOTE — Progress Notes (Signed)
Discussed discharge instructions with patient including medications and follow up appointments.   Administered pneumonia vaccine prior to discharge.    Work not given

## 2020-04-12 NOTE — Plan of Care (Signed)

## 2020-04-12 NOTE — Discharge Summary (Signed)
Physician Discharge Summary   Brianna Dalton  female DOB: 1945-09-28  CBJ:[id number]  PCP: Idelle Crouch, MD  Admit date: 04/11/2020 Discharge date: 04/12/2020  Admitted From: home Disposition:  home CODE STATUS: Full code  Discharge Instructions    Discharge instructions   Complete by: As directed    Dr. Ubaldo Glassing started you on Aldactone, a new blood pressure medication.  Please hold your home Losartan until outpatient followup with your doctor.  Continue the rest of your home medications as directed.  We recommend that you wear compression stockings while standing at work.   Dr. Enzo Bi [facility] Course:  For full details, please see H&P, progress notes, consult notes and ancillary notes.  Briefly,  Brianna Dalton is a 75 y.o. 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  States she felt like she was going to pass out and someone said her eyes rolled back in her head as she sat down.   Chest pain, ACS ruled out Troponin was negative x2, EKG nonacute. -CTA chest negative for PE or acute aortic abnormality -Had left heart cath in March 2019 with normal coronaries. Cardiology consulted, Dr. Ubaldo Glassing started pt on Aldactone and recommended pt follow up with cardiology as outpatient.  No further workup recommended.   -Continue home aspirin and metoprolol.  Not currently on statin    Hypertensive urgency -BP 178/75 in the ED, however, varied widely between 170's and 120's within hours of each other.   -CTA chest abdomen pelvis with no acute aortic abnormality.  Home metop continued.  Cardiology added  Aldactone 25 mg daily.  Home Losartan held pending outpatient cardiology folllowup since a new BP was added and pt's BP varied widely.    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  Pt also has hx of venous stasis and insufficiency.  Recommend compression stockings while standing for prolonged periods of time.    Aortic atherosclerosis on CTA [facility]) Home ASA continued.  Defer to outpatient providers for starting statin.    Adult hypothyroidism -Continue levothyroxine    Moderate aortic stenosis No dyspnea or hypoxia.    Discharge Diagnoses:  Principal Problem:   Chest pain Active Problems:   Adult hypothyroidism   Chronic venous insufficiency   Moderate aortic stenosis   Hypertensive urgency   Aortic atherosclerosis (HCC)   Postural dizziness with presyncope    Discharge Instructions:  Allergies as of 04/12/2020      Reactions   Oxycodone-acetaminophen Hives, Rash, Shortness Of Breath, Swelling   Strawberry Flavor Shortness Of Breath, Rash   Aloe Vera Hives   Amlodipine    Hydrocodone Hives   Latex    NEGATIVE BY IgE (<0.10)   Morphine And Related Other (See Comments)   Nausea delusion   Penicillins Hives   Has patient had a PCN reaction causing immediate rash, facial/tongue/throat swelling, SOB or lightheadedness with hypotension: Yes Has patient had a PCN reaction causing severe rash involving mucus membranes or skin necrosis: No Has patient had a PCN reaction that required hospitalization: No Has patient had a PCN reaction occurring within  the last 10 years: No If all of the above answers are "NO", then may proceed with Cephalosporin use.   Meperidine Rash      Medication List    STOP taking these medications   doxycycline 100 MG capsule Commonly known as: VIBRAMYCIN   ondansetron 4 MG tablet Commonly known as: Zofran     TAKE these medications   allopurinol 300 MG  tablet Commonly known as: ZYLOPRIM Take 300 mg by mouth daily.   aspirin EC 81 MG tablet Take 81 mg by mouth daily.   colchicine 0.6 MG tablet Take 0.6 mg by mouth as needed.   levothyroxine 125 MCG tablet Commonly known as: SYNTHROID Take 125 mcg by mouth daily before breakfast.   losartan 50 MG tablet Commonly known as: COZAAR Hold until followup with outpatient doctor since cardiologist started you on Aldactone. What changed:   how much to take  how to take this  when to take this  additional instructions   metoprolol tartrate 25 MG tablet Commonly known as: LOPRESSOR Take 12.5 mg by mouth 2 (two) times daily.   pneumococcal 23 valent vaccine 25 MCG/0.5ML injection Commonly known as: PNEUMOVAX-23 Inject 0.5 mLs into the muscle tomorrow at 10 am for 1 dose. Start taking on: April 13, 2020   spironolactone 25 MG tablet Commonly known as: ALDACTONE Take 1 tablet (25 mg total) by mouth daily.   torsemide 10 MG tablet Commonly known as: DEMADEX Take 10 mg by mouth daily as needed.   traMADol 50 MG tablet Commonly known as: ULTRAM Take 1-2 tablets (50-100 mg total) by mouth every 4 (four) hours as needed for moderate pain.        Follow-up Information    Idelle Crouch, MD. Schedule an appointment as soon as possible for a visit in 1 week.   Specialty: Internal Medicine Why: Patient will need to make a follow up appointment. Contact information: [place] [address] [phone]        Teodoro Spray, MD On 05/04/2020.   Specialty: Cardiology Why: @ 3:45pm Contact information: [address] [place] [address] [phone]               Allergies  Allergen Reactions  . Oxycodone-Acetaminophen Hives, Rash, Shortness Of Breath and Swelling  . Strawberry Flavor Shortness Of Breath and Rash  . Aloe Vera Hives  . Amlodipine   . Hydrocodone Hives  . Latex     NEGATIVE BY IgE (<0.10)  .  Morphine And Related Other (See Comments)    Nausea delusion   . Penicillins Hives    Has patient had a PCN reaction causing immediate rash, facial/tongue/throat swelling, SOB or lightheadedness with hypotension: Yes Has patient had a PCN reaction causing severe rash involving mucus membranes or skin necrosis: No Has patient had a PCN reaction that required hospitalization: No Has patient had a PCN reaction occurring within the last 10 years: No If all of the above answers are "NO", then may proceed with Cephalosporin use.  . Meperidine Rash     The results of significant diagnostics from this hospitalization (including imaging, microbiology, ancillary and laboratory) are listed below for reference.   Consultations:   Procedures/Studies: DG Chest 2 View  Result Date: 04/11/2020 CLINICAL DATA:  Stabbing chest pain EXAM: CHEST - 2 VIEW COMPARISON:  03/27/2019 FINDINGS: Cardiac shadow is stable. Aortic calcifications are seen. Lungs are well aerated bilaterally. No focal infiltrate or effusion is noted.  No acute bony abnormality is noted. Degenerative changes of the thoracic spine are seen. IMPRESSION: No acute abnormality noted. Electronically Signed   By: Inez Catalina M.D.   On: 04/11/2020 16:23   CT Angio Chest/Abd/Pel for Dissection W and/or Wo Contrast  Result Date: 04/11/2020 CLINICAL DATA:  Chest pain and back pain EXAM: CT ANGIOGRAPHY CHEST, ABDOMEN AND PELVIS TECHNIQUE: Non-contrast CT of the chest was initially obtained. Multidetector CT imaging through the chest, abdomen and pelvis was performed using the standard protocol during bolus administration of intravenous contrast. Multiplanar reconstructed images and MIPs were obtained and reviewed to evaluate the vascular anatomy. CONTRAST:  152mL OMNIPAQUE IOHEXOL 350 MG/ML SOLN COMPARISON:  None. FINDINGS: CTA CHEST FINDINGS Cardiovascular: --Heart: The heart size is normal.  There is nopericardial effusion. --Aorta: The course and  caliber of the thoracic aorta are normal. Scattered mild-to-moderate aortic atherosclerosis is noted. Aortic valve calcifications are seen. Coronary artery calcifications are noted. Precontrast images show no aortic intramural hematoma. There is no blood pool, dissection or penetrating ulcer demonstrated on arterial phase postcontrast imaging. There is a conventional 3 vessel aortic arch branching pattern. The proximal arch vessels are widely patent. --Pulmonary Arteries: Contrast timing is optimized for preferential opacification of the aorta. Within that limitation, normal central pulmonary arteries. Mediastinum/Nodes: No mediastinal, hilar or axillary lymphadenopathy. The visualized thyroid and thoracic esophageal course are unremarkable. Lungs/Pleura: No pulmonary nodules or masses. No pleural effusion or pneumothorax. No focal airspace consolidation. No focal pleural abnormality. Musculoskeletal: No chest wall abnormality. No acute osseous findings. Review of the MIP images confirms the above findings. CTA ABDOMEN AND PELVIS FINDINGS VASCULAR Aorta: Normal caliber aorta without aneurysm, dissection, vasculitis or hemodynamically significant stenosis. There is scattered moderate aortic atherosclerosis. Celiac: No aneurysm, dissection or hemodynamically significant stenosis. Normal branching pattern SMA: Widely patent without dissection or stenosis. Renals: Single renal arteries bilaterally. Mild stenosis due to atherosclerosis seen at the origin of the renal arteries. IMA: Patent without abnormality. Inflow: No aneurysm, stenosis or dissection. Veins: Normal course and caliber of the major veins. Assessment is otherwise limited by the arterial dominant contrast phase. Review of the MIP images confirms the above findings. NON-VASCULAR Hepatobiliary: Normal hepatic contours and density. No visible biliary dilatation. The patient is status post cholecystectomy. Pancreas: Normal contours without ductal dilatation.  No peripancreatic fluid collection. Spleen: Normal arterial phase splenic enhancement pattern. Adrenals/Urinary Tract: --Adrenal glands: Normal. --Right kidney/ureter: No hydronephrosis or perinephric stranding. No nephrolithiasis. No obstructing ureteral stones. --Left kidney/ureter: No hydronephrosis or perinephric stranding. No nephrolithiasis. No obstructing ureteral stones. --Urinary bladder: Unremarkable. Stomach/Bowel: --Stomach/Duodenum: No hiatal hernia or other gastric abnormality. Normal duodenal course and caliber. --Small bowel: No dilatation or inflammation. --Colon: Scattered colonic diverticulosis is noted. There is a moderate amount of colonic stool. The appendix is unremarkable. Lymphatic:  No abdominal or pelvic lymphadenopathy. Reproductive: No free fluid in the pelvis. Musculoskeletal. No bony spinal canal stenosis or focal osseous abnormality. Anterior flowing osteophytes seen in the lower thoracic spine. Other: None. Review of the MIP images confirms the above findings. IMPRESSION: No acute aortic abnormality Moderate aortic atherosclerosis. Aortic Atherosclerosis (ICD10-I70.0). Coronary artery calcifications Mild stenosis at the origin of the bilateral renal arteries due to atherosclerosis Diverticulosis without diverticulitis Electronically Signed   By: Prudencio Pair M.D.   On: 04/11/2020 19:16      Labs: BNP (last 3 results) Recent Labs    02/21/20 1419 04/11/20 1754  BNP 126.8* 46.5   Basic Metabolic Panel: Recent Labs  Lab 04/11/20 1528  04/11/20 2112  NA 139  --   K 3.7  --   CL 105  --   CO2 27  --   GLUCOSE 132*  --   BUN 14  --   CREATININE 0.96 0.89  CALCIUM 9.4  --    Liver Function Tests: No results for input(s): AST, ALT, ALKPHOS, BILITOT, PROT, ALBUMIN in the last 168 hours. No results for input(s): LIPASE, AMYLASE in the last 168 hours. No results for input(s): AMMONIA in the last 168 hours. CBC: Recent Labs  Lab 04/11/20 1528 04/11/20 2112  WBC  9.9 9.9  HGB 14.4 14.1  HCT 44.1 43.0  MCV 92.8 92.7  PLT 228 218   Cardiac Enzymes: No results for input(s): CKTOTAL, CKMB, CKMBINDEX, TROPONINI in the last 168 hours. BNP: Invalid input(s): POCBNP CBG: No results for input(s): GLUCAP in the last 168 hours. D-Dimer No results for input(s): DDIMER in the last 72 hours. Hgb A1c No results for input(s): HGBA1C in the last 72 hours. Lipid Profile No results for input(s): CHOL, HDL, LDLCALC, TRIG, CHOLHDL, LDLDIRECT in the last 72 hours. Thyroid function studies No results for input(s): TSH, T4TOTAL, T3FREE, THYROIDAB in the last 72 hours.  Invalid input(s): FREET3 Anemia work up No results for input(s): VITAMINB12, FOLATE, FERRITIN, TIBC, IRON, RETICCTPCT in the last 72 hours. Urinalysis    Component Value Date/Time   COLORURINE AMBER (A) 06/17/2018 1552   APPEARANCEUR CLOUDY (A) 06/17/2018 1552   APPEARANCEUR Clear 07/02/2013 1409   LABSPEC 1.030 06/17/2018 1552   LABSPEC 1.011 07/02/2013 1409   PHURINE 5.0 06/17/2018 1552   GLUCOSEU NEGATIVE 06/17/2018 1552   GLUCOSEU Negative 07/02/2013 1409   HGBUR NEGATIVE 06/17/2018 1552   BILIRUBINUR SMALL (A) 06/17/2018 1552   BILIRUBINUR Negative 07/02/2013 1409   KETONESUR NEGATIVE 06/17/2018 1552   PROTEINUR >=300 (A) 06/17/2018 1552   NITRITE NEGATIVE 06/17/2018 1552   LEUKOCYTESUR NEGATIVE 06/17/2018 1552   LEUKOCYTESUR Negative 07/02/2013 1409   Sepsis Labs Invalid input(s): PROCALCITONIN,  WBC,  LACTICIDVEN Microbiology Recent Results (from the past 240 hour(s))  Resp Panel by RT-PCR (Flu A&B, Covid) Nasopharyngeal Swab     Status: None   Collection Time: 04/12/20  5:12 AM   Specimen: Nasopharyngeal Swab; Nasopharyngeal(NP) swabs in vial transport medium  Result Value Ref Range Status   SARS Coronavirus 2 by RT PCR NEGATIVE NEGATIVE Final    Comment: (NOTE) SARS-CoV-2 target nucleic acids are NOT DETECTED.  The SARS-CoV-2 RNA is generally detectable in upper  respiratory specimens during the acute phase of infection. The lowest concentration of SARS-CoV-2 viral copies this assay can detect is 138 copies/mL. A negative result does not preclude SARS-Cov-2 infection and should not be used as the sole basis for treatment or other patient management decisions. A negative result may occur with  improper specimen collection/handling, submission of specimen other than nasopharyngeal swab, presence of viral mutation(s) within the areas targeted by this assay, and inadequate number of viral copies(<138 copies/mL). A negative result must be combined with clinical observations, patient history, and epidemiological information. The expected result is Negative.  Fact Sheet for Patients:  EntrepreneurPulse.com.au  Fact Sheet for Healthcare Providers:  IncredibleEmployment.be  This test is no t yet approved or cleared by the [place] FDA and  has been authorized for detection and/or diagnosis of SARS-CoV-2 by FDA under an Emergency Use Authorization (EUA). This EUA will remain  in effect (meaning this test can be used) for the duration of the COVID-19 declaration under Section  564(b)(1) of the Act, 21 U.S.C.section 360bbb-3(b)(1), unless the authorization is terminated  or revoked sooner.       Influenza A by PCR NEGATIVE NEGATIVE Final   Influenza B by PCR NEGATIVE NEGATIVE Final    Comment: (NOTE) The Xpert Xpress SARS-CoV-2/FLU/RSV plus assay is intended as an aid in the diagnosis of influenza from Nasopharyngeal swab specimens and should not be used as a sole basis for treatment. Nasal washings and aspirates are unacceptable for Xpert Xpress SARS-CoV-2/FLU/RSV testing.  Fact Sheet for Patients: EntrepreneurPulse.com.au  Fact Sheet for Healthcare Providers: IncredibleEmployment.be  This test is not yet approved or cleared by the [place] FDA and has been  authorized for detection and/or diagnosis of SARS-CoV-2 by FDA under an Emergency Use Authorization (EUA). This EUA will remain in effect (meaning this test can be used) for the duration of the COVID-19 declaration under Section 564(b)(1) of the Act, 21 U.S.C. section 360bbb-3(b)(1), unless the authorization is terminated or revoked.  Performed at [facility], [address] [place], [place] [address]      Total time spend on discharging this patient, including the last patient exam, discussing the hospital stay, instructions for ongoing care as it relates to all pertinent caregivers, as well as preparing the medical discharge records, prescriptions, and/or referrals as applicable, is 45 minutes.    Enzo Bi, MD  [facility] 04/12/2020, 12:48 PM

## 2020-04-19 DIAGNOSIS — N289 Disorder of kidney and ureter, unspecified: Secondary | ICD-10-CM | POA: Insufficient documentation

## 2020-04-19 DIAGNOSIS — I10 Essential (primary) hypertension: Secondary | ICD-10-CM | POA: Insufficient documentation

## 2020-11-09 ENCOUNTER — Other Ambulatory Visit: Payer: Self-pay | Admitting: Student

## 2020-11-09 DIAGNOSIS — M62838 Other muscle spasm: Secondary | ICD-10-CM

## 2020-11-15 ENCOUNTER — Ambulatory Visit
Admission: RE | Admit: 2020-11-15 | Discharge: 2020-11-15 | Disposition: A | Discharge status: Home / Self Care | Payer: Medicare HMO | Source: Ambulatory Visit | Attending: Student | Admitting: Student

## 2020-11-15 ENCOUNTER — Other Ambulatory Visit: Payer: Self-pay

## 2020-11-15 DIAGNOSIS — I6523 Occlusion and stenosis of bilateral carotid arteries: Secondary | ICD-10-CM | POA: Insufficient documentation

## 2020-11-15 DIAGNOSIS — M62838 Other muscle spasm: Secondary | ICD-10-CM | POA: Insufficient documentation

## 2020-11-18 ENCOUNTER — Other Ambulatory Visit: Payer: Medicare HMO

## 2021-01-11 ENCOUNTER — Emergency Department: Payer: Medicare HMO

## 2021-01-11 ENCOUNTER — Encounter: Payer: Self-pay | Admitting: Emergency Medicine

## 2021-01-11 ENCOUNTER — Emergency Department
Admission: RE | Admit: 2021-01-11 | Discharge: 2021-01-11 | Disposition: A | Discharge status: Home / Self Care | Payer: Medicare HMO | Source: Ambulatory Visit | Attending: Internal Medicine | Admitting: Internal Medicine

## 2021-01-11 ENCOUNTER — Other Ambulatory Visit: Payer: Self-pay

## 2021-01-11 ENCOUNTER — Other Ambulatory Visit: Payer: Self-pay | Admitting: Internal Medicine

## 2021-01-11 ENCOUNTER — Emergency Department
Admission: EM | Admit: 2021-01-11 | Discharge: 2021-01-11 | Disposition: A | Discharge status: Home / Self Care | Payer: Medicare HMO | Attending: Emergency Medicine | Admitting: Emergency Medicine

## 2021-01-11 DIAGNOSIS — S0590XA Unspecified injury of unspecified eye and orbit, initial encounter: Secondary | ICD-10-CM | POA: Insufficient documentation

## 2021-01-11 DIAGNOSIS — W01198A Fall on same level from slipping, tripping and stumbling with subsequent striking against other object, initial encounter: Secondary | ICD-10-CM | POA: Diagnosis not present

## 2021-01-11 DIAGNOSIS — R519 Headache, unspecified: Secondary | ICD-10-CM | POA: Insufficient documentation

## 2021-01-11 DIAGNOSIS — S6991XA Unspecified injury of right wrist, hand and finger(s), initial encounter: Secondary | ICD-10-CM | POA: Diagnosis present

## 2021-01-11 DIAGNOSIS — Z7982 Long term (current) use of aspirin: Secondary | ICD-10-CM | POA: Insufficient documentation

## 2021-01-11 DIAGNOSIS — S022XXA Fracture of nasal bones, initial encounter for closed fracture: Secondary | ICD-10-CM | POA: Diagnosis not present

## 2021-01-11 DIAGNOSIS — S0993XA Unspecified injury of face, initial encounter: Secondary | ICD-10-CM

## 2021-01-11 DIAGNOSIS — E039 Hypothyroidism, unspecified: Secondary | ICD-10-CM | POA: Insufficient documentation

## 2021-01-11 DIAGNOSIS — Z96651 Presence of right artificial knee joint: Secondary | ICD-10-CM | POA: Insufficient documentation

## 2021-01-11 DIAGNOSIS — S52501A Unspecified fracture of the lower end of right radius, initial encounter for closed fracture: Secondary | ICD-10-CM | POA: Insufficient documentation

## 2021-01-11 DIAGNOSIS — Z91040 Latex allergy status: Secondary | ICD-10-CM | POA: Diagnosis not present

## 2021-01-11 DIAGNOSIS — Z96652 Presence of left artificial knee joint: Secondary | ICD-10-CM | POA: Diagnosis not present

## 2021-01-11 DIAGNOSIS — R51 Headache with orthostatic component, not elsewhere classified: Secondary | ICD-10-CM | POA: Insufficient documentation

## 2021-01-11 DIAGNOSIS — Z79899 Other long term (current) drug therapy: Secondary | ICD-10-CM | POA: Insufficient documentation

## 2021-01-11 DIAGNOSIS — S022XXD Fracture of nasal bones, subsequent encounter for fracture with routine healing: Secondary | ICD-10-CM

## 2021-01-11 DIAGNOSIS — I10 Essential (primary) hypertension: Secondary | ICD-10-CM | POA: Insufficient documentation

## 2021-01-11 NOTE — ED Provider Notes (Signed)
°  Emergency Medicine Provider Triage Evaluation Note  Brianna Dalton , a 75 y.o.female,  was evaluated in triage.  Pt complains of fall.  Patient states that yesterday she was walking when she felt her legs pushed her into a run towards the wall where she struck her face she was seen at [facility] clinic today where they did a CT scan of her head, showing mildly displaced nasal fractures.  Additionally they did lab work, currently pending crossover.  They sent her here for further evaluation.   Review of Systems  Positive: Facial pain Negative: Denies fever, chest pain, vomiting  Physical Exam   Vitals:   01/11/21 1618 01/11/21 1625  BP: (!) 165/105   Pulse: 76   Resp: 18   Temp:  98.4 F (36.9 C)  SpO2: 96%    Gen:   Awake, no distress   Resp:  Normal effort  MSK:   Moves extremities without difficulty  Other:    Medical Decision Making  Given the patient's initial medical screening exam, the following diagnostic evaluation has been ordered. The patient will be placed in the appropriate treatment space, once one is available, to complete the evaluation and treatment. I have discussed the plan of care with the patient and I have advised the patient that an ED physician or mid-level practitioner will reevaluate their condition after the test results have been received, as the results may give them additional insight into the type of treatment they may need.    Diagnostics: EKG  Treatments: none immediately   Teodoro Spray, PA 01/11/21 1629    Rada Hay, MD 01/11/21 2039

## 2021-01-11 NOTE — ED Provider Notes (Signed)
[facility] Emergency Department Provider Note  Time seen: 9:01 PM  I have reviewed the triage vital signs and the nursing notes.   HISTORY  Chief Complaint Loss of Consciousness   HPI Brianna Dalton is a 75 y.o. female with a past medical history of angina, arthritis, hypertension, presents to the emergency department after a fall.  According to the patient yesterday she was walking from her kitchen to her utility room where she tripped causing her to stumble forwards hitting her head/face on the dryer.  Patient denies LOC.  Denies anticoagulation.  Patient followed up with her primary care doctor Dr. Doy Hutching today.  Patient has bruising and swelling to the bridge of her nose which is where she states she hit the dryer.  Patient had outpatient head CT scan performed and states she was told to go to the ER.  Patient's only other complaint is pain to her right wrist, with mild bruising to this area.   Past Medical History:  Diagnosis Date   Angina pectoris syndrome ([place])    Arterial hemorrhage    Lt ankle possible venous bleed   Arthritis    Hypertension    Hypothyroidism    Neuropathy    Varicose vein of leg     Patient Active Problem List   Diagnosis Date Noted   Hypertensive urgency 04/11/2020   Aortic atherosclerosis ([place]) 04/11/2020   Postural dizziness with presyncope 04/11/2020   Bradycardia 04/14/2019   Moderate aortic stenosis 04/14/2019   Acute cholecystitis 03/29/2019   Elevated troponin 03/27/2019   Cholelithiasis 03/27/2019   Lower limb ulcer, ankle, left, limited to breakdown of skin ([place]) 02/24/2019   Varicose veins of lower extremity with pain, bilateral 10/31/2018   Non-cardiac chest pain 10/28/2018   Infectious diarrhea 09/03/2018   Lymphedema 07/30/2018   Chronic venous insufficiency 07/30/2018   Swelling of limb 07/15/2018   Chest pain 04/09/2017   Pain and swelling of lower leg, right 03/21/2017   Swelling of foot joint, right  03/11/2017   Encounter for long-term (current) use of high-risk medication 03/11/2017   Acute gout of right foot 03/11/2017   SOBOE (shortness of breath on exertion) 12/04/2016   Bilateral carotid artery stenosis 12/04/2016   Thyroid disease 09/15/2015   Knee joint replaced by other means 06/12/2015   Bergmann's syndrome 04/27/2015   S/P total knee arthroplasty 04/27/2015   Lumbar radiculopathy 07/07/2013   Acid reflux 07/05/2013   BP (high blood pressure) 07/05/2013   HLD (hyperlipidemia) 07/05/2013   Adult hypothyroidism 07/05/2013   Morbid obesity ([place]) 07/05/2013   Impaired renal function 07/05/2013   Arthritis 07/05/2013   Hyperlipidemia 07/05/2013    Past Surgical History:  Procedure Laterality Date   ABDOMINAL HYSTERECTOMY     BREAST BIOPSY Right 01/19/2013   stereo biopsy of two areas neg   CHOLECYSTECTOMY N/A 03/28/2019   Procedure: LAPAROSCOPIC CHOLECYSTECTOMY;  Surgeon: Olean Ree, MD;  Location: [facility] ORS;  Service: General;  Laterality: N/A;   JOINT REPLACEMENT     lt knee   KNEE ARTHROPLASTY Right 04/27/2015   Procedure: COMPUTER ASSISTED TOTAL KNEE ARTHROPLASTY;  Surgeon: Dereck Leep, MD;  Location: [facility] ORS;  Service: Orthopedics;  Laterality: Right;   LEFT HEART CATH AND CORONARY ANGIOGRAPHY N/A 04/10/2017   Procedure: LEFT HEART CATH AND CORONARY ANGIOGRAPHY;  Surgeon: Teodoro Spray, MD;  Location: [facility];  Service: Cardiovascular;  Laterality: N/A;   SHOULDER ARTHROSCOPY Left    THYROID SURGERY Bilateral  TUMOR REMOVAL Right    neck    Prior to Admission medications   Medication Sig Start Date End Date Taking? Authorizing Provider  allopurinol (ZYLOPRIM) 300 MG tablet Take 300 mg by mouth daily. 03/11/17   [provider]  aspirin EC 81 MG tablet Take 81 mg by mouth daily.    [provider]  colchicine 0.6 MG tablet Take 0.6 mg by mouth as needed.     [provider]  levothyroxine (SYNTHROID) 125 MCG tablet  Take 125 mcg by mouth daily before breakfast.     [provider]  losartan (COZAAR) 50 MG tablet Hold until followup with outpatient doctor since cardiologist started you on Aldactone. 04/12/20   Enzo Bi, MD  metoprolol tartrate (LOPRESSOR) 25 MG tablet Take 12.5 mg by mouth 2 (two) times daily.    [provider]  spironolactone (ALDACTONE) 25 MG tablet Take 1 tablet (25 mg total) by mouth daily. 04/12/20 07/11/20  Enzo Bi, MD  torsemide (DEMADEX) 10 MG tablet Take 10 mg by mouth daily as needed.    [provider]  traMADol (ULTRAM) 50 MG tablet Take 1-2 tablets (50-100 mg total) by mouth every 4 (four) hours as needed for moderate pain. 04/28/15   Watt Climes, PA    Allergies  Allergen Reactions   Oxycodone-Acetaminophen Hives, Rash, Shortness Of Breath and Swelling   Strawberry Flavor Shortness Of Breath and Rash   Aloe Vera Hives   Amlodipine    Hydrocodone Hives   Latex     NEGATIVE BY IgE (<0.10)   Morphine And Related Other (See Comments)    Nausea delusion    Penicillins Hives    Has patient had a PCN reaction causing immediate rash, facial/tongue/throat swelling, SOB or lightheadedness with hypotension: Yes Has patient had a PCN reaction causing severe rash involving mucus membranes or skin necrosis: No Has patient had a PCN reaction that required hospitalization: No Has patient had a PCN reaction occurring within the last 10 years: No If all of the above answers are "NO", then may proceed with Cephalosporin use.   Meperidine Rash    No family history on file.  Social History Social History   Tobacco Use   Smoking status: Never   Smokeless tobacco: Never  Substance Use Topics   Alcohol use: No   Drug use: No    Review of Systems Constitutional: Negative for fever. Eyes: Negative for visual complaints.  As noted some bruising below both of her eyes. ENT: Bruising and swelling to the bridge of her nose. Cardiovascular: Negative for  chest pain. Respiratory: Negative for shortness of breath. Gastrointestinal: Negative for abdominal pain Musculoskeletal: Pain to her right wrist. Neurological: Negative for headache All other ROS negative  ____________________________________________   PHYSICAL EXAM:  VITAL SIGNS: ED Triage Vitals  Enc Vitals Group     BP 01/11/21 1618 (!) 165/105     Pulse Rate 01/11/21 1618 76     Resp 01/11/21 1618 18     Temp 01/11/21 1625 98.4 F (36.9 C)     Temp Source 01/11/21 1618 Oral     SpO2 01/11/21 1618 96 %     Weight 01/11/21 1626 280 lb (127 kg)     Height 01/11/21 1626 5\' 10"  (1.778 m)     Head Circumference --      Peak Flow --      Pain Score 01/11/21 1625 4     Pain Loc --  Pain Edu? --      Excl. in [place]? --    Constitutional: Alert and oriented. Well appearing and in no distress. Eyes: Normal exam ENT      Head: Small hematoma to the bridge of the nose with bilateral periorbital ecchymosis below the eyes consistent with gravity dependent ecchymosis from her nasal hematoma.  No sign of septal hematoma.      Mouth/Throat: Mucous membranes are moist. Cardiovascular: Normal rate, regular rhythm. Respiratory: Normal respiratory effort without tachypnea nor retractions. Breath sounds are clear Gastrointestinal: Soft and nontender. No distention.   Musculoskeletal: Patient has moderate tenderness to the distal right forearm/right wrist over the distal radius with small amount of bruising noted to this area.  Neuro vastly intact distally.  All other extremities have good range of motion without pain. Neurologic:  Normal speech and language. No gross focal neurologic deficits Skin:  Skin is warm.  Bruising to the bridge of the nose as well as right forearm/wrist. Psychiatric: Mood and affect are normal.  ____________________________________________   RADIOLOGY  I reviewed the patient's outpatient CT she has bilateral nasal bone fractures but no other acute  findings.  Wrist x-ray shows a nondisplaced extra-articular fracture to the distal radius.  ____________________________________________   INITIAL IMPRESSION / ASSESSMENT AND PLAN / ED COURSE  Pertinent labs & imaging results that were available during my care of the patient were reviewed by me and considered in my medical decision making (see chart for details).   Patient presents emergency department referred from her PCP after a fall.  Patient states she hit her dryer on the nasal bridge, which is where the majority of her pain is located.  Patient does have small to moderate hematoma over the nasal bridge no septal hematoma.  Has what appears to be gravity dependent ecchymosis which she says just showed up today below both of her eyes consistent with gravity dependent ecchymosis from the nasal bridge hematoma.  No intracranial abnormalities noted on outpatient CT.  Patient denies any weakness or numbness confusion or slurred speech.  Patient's right wrist x-ray does show a nondisplaced distal radius fracture we will place the patient in a right arm splint and sling have the patient follow-up with her orthopedist Dr. Marry Guan.  Discussed Tylenol as needed for discomfort.  Patient agreeable plan of care.  GYNETH HUBKA was evaluated in Emergency Department on 01/11/2021 for the symptoms described in the history of present illness. She was evaluated in the context of the global COVID-19 pandemic, which necessitated consideration that the patient might be at risk for infection with the SARS-CoV-2 virus that causes COVID-19. Institutional protocols and algorithms that pertain to the evaluation of patients at risk for COVID-19 are in a state of rapid change based on information released by regulatory bodies including the CDC and federal and state organizations. These policies and algorithms were followed during the patient's care in the ED.  ____________________________________________   FINAL  CLINICAL IMPRESSION(S) / ED DIAGNOSES  Distal radius fracture Nasal bone fracture   Harvest Dark, MD 01/11/21 2106

## 2021-01-11 NOTE — ED Triage Notes (Signed)
Pt reports that she fell yesterday into her dryer. She felt like her legs went out from under her, she knew that she was going to fall. Her nose was bleeding. She went to her PMD and they sent her to CT they found that she has a nasal fx and sent her here. She has bruising and swelling under her eyes, her right wrist is also swollen.

## 2021-01-11 NOTE — Discharge Instructions (Signed)
You have been seen in the emergency department following a fall.  Your CT scan head shows nasal bone fractures.  This should heal on its own.  If you have trouble breathing/snoring, etc. after this is healed over the next 2 to 3 weeks please follow-up with your primary care doctor to discuss ENT referral.  You have also suffered a fracture of your right wrist.  You have been placed in a splint and sling, please wear the splint at all times.  Please attempt to keep it dry.  You may take the sling off at night for sleeping or when showering, etc.  Please call the number to follow-up with orthopedics to arrange a follow-up appointment approximately 1 week.  Return to the emergency department for any weakness or numbness of any arm or leg confusion, slurred speech or any other symptom personally concerning to yourself.  Please use Tylenol as needed for pain, as written on the box.

## 2021-04-07 ENCOUNTER — Other Ambulatory Visit: Payer: Self-pay | Admitting: Internal Medicine

## 2021-04-07 DIAGNOSIS — R1310 Dysphagia, unspecified: Secondary | ICD-10-CM

## 2021-05-01 ENCOUNTER — Other Ambulatory Visit: Payer: Self-pay | Admitting: Internal Medicine

## 2021-05-01 DIAGNOSIS — Z1231 Encounter for screening mammogram for malignant neoplasm of breast: Secondary | ICD-10-CM

## 2021-05-16 IMAGING — CT CT ANGIOGRAPHY CHEST
2 of 6 series · 17 of 46 positions shown · IV contrast (APPLIED)
Comparison: 04/09/2017 and chest radiograph from 06/17/2018

CLINICAL DATA: Elevated D-dimer level, intermediate probability for
pulmonary embolus.

EXAM:
CT ANGIOGRAPHY CHEST WITH CONTRAST
TECHNIQUE: Multidetector CT imaging of the chest was performed using the
standard protocol during bolus administration of intravenous
contrast. Multiplanar CT image reconstructions and MIPs were
obtained to evaluate the vascular anatomy.
CONTRAST:  75mL OMNIPAQUE IOHEXOL 350 MG/ML SOLN

[Series 7: thins · axial · 0.75mm/px · z∈[-535,-292]mm · 14 of 267 slices shown]
[im 12/267  lung]
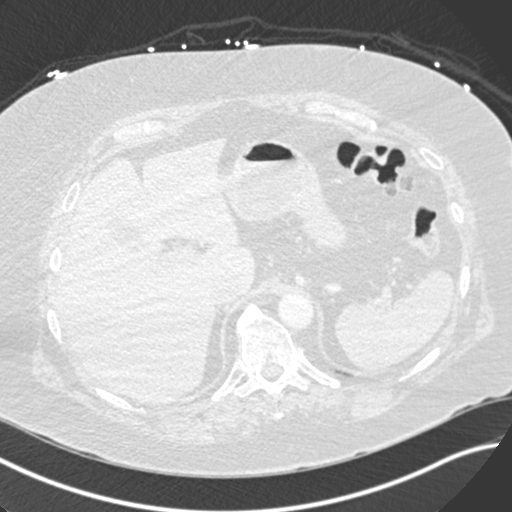
[im 35/267  soft-tissue]
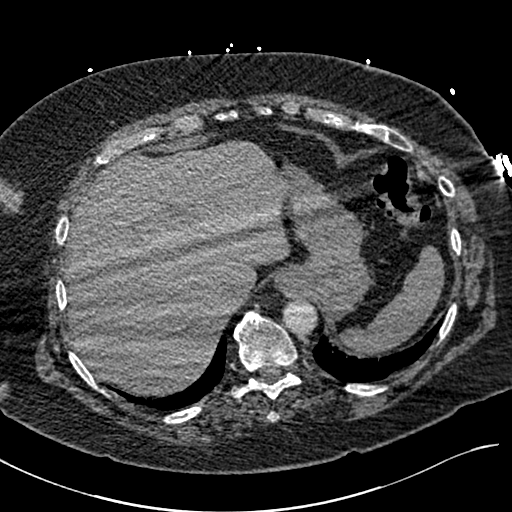
[im 47/267  lung]
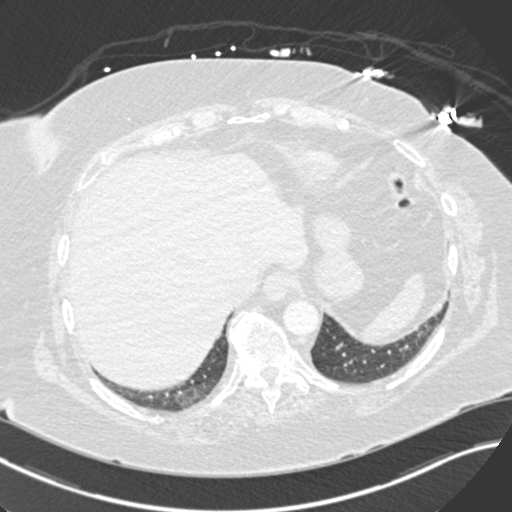
[im 70/267  soft-tissue]
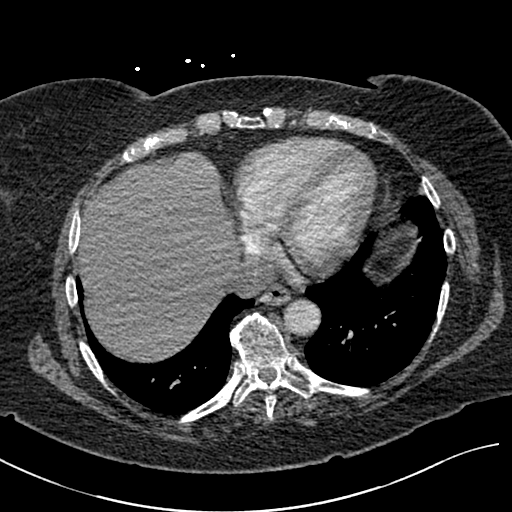
[im 93/267  lung]
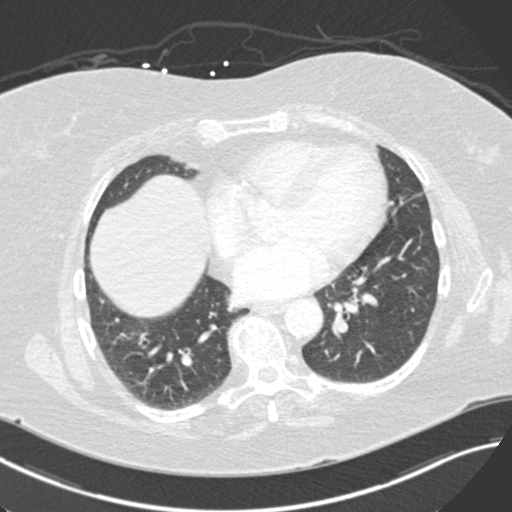
[im 105/267  soft-tissue]
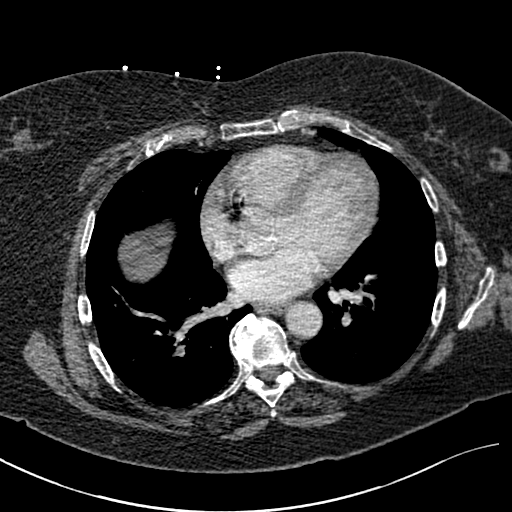
[im 128/267  lung]
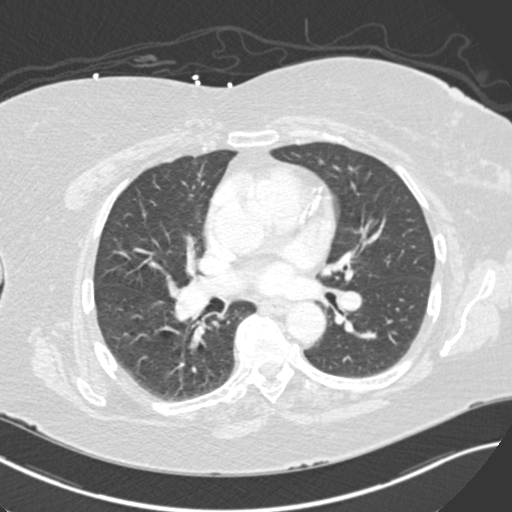
[im 139/267  soft-tissue]
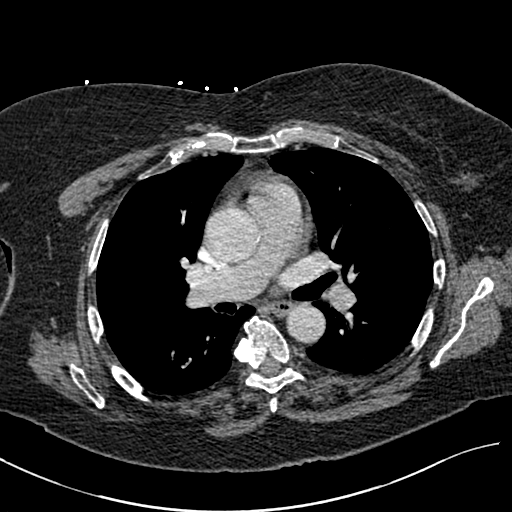
[im 162/267  lung]
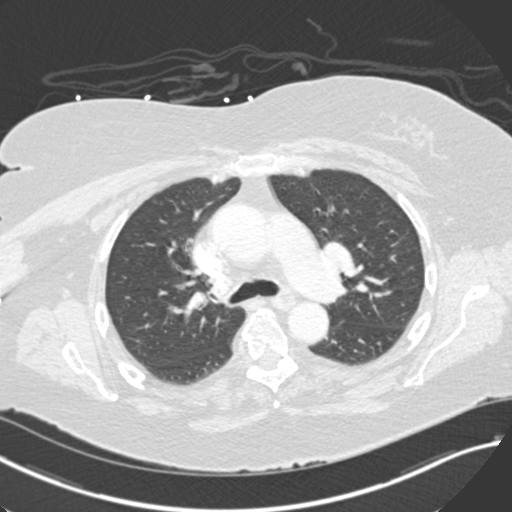
[im 174/267  soft-tissue]
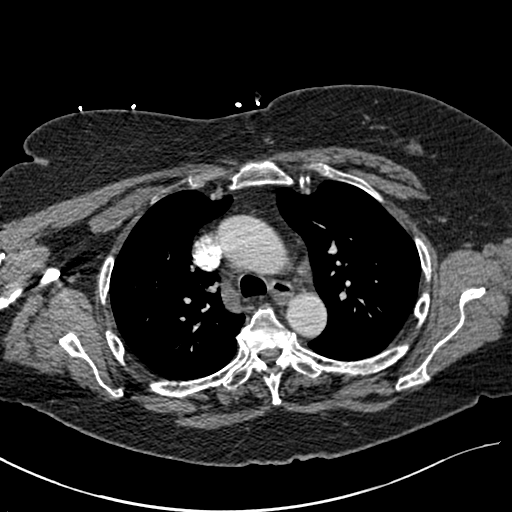
[im 197/267  lung]
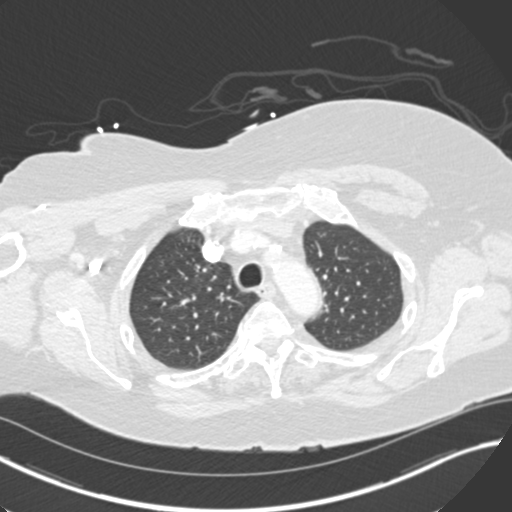
[im 220/267  soft-tissue]
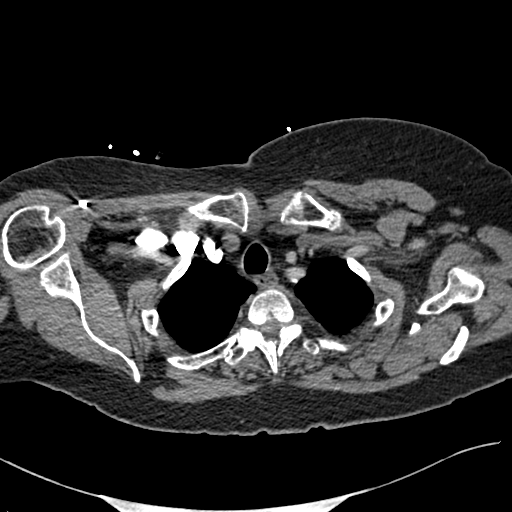
[im 232/267  lung]
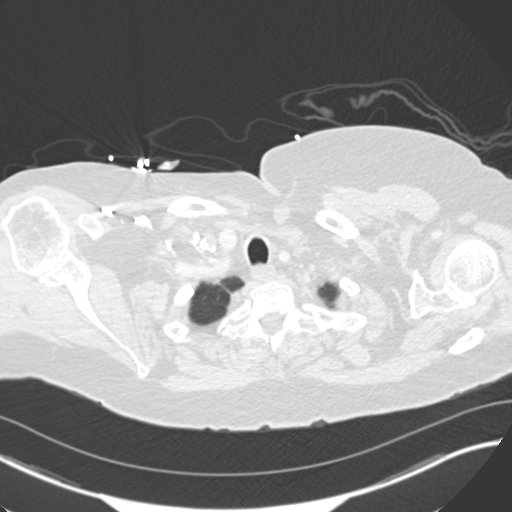
[im 255/267  soft-tissue]
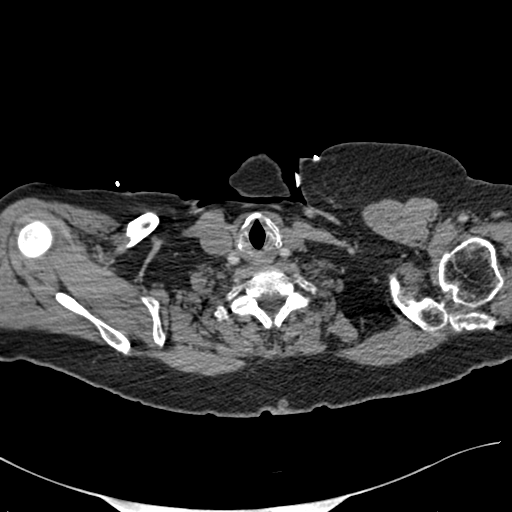

[Series 9: coronal mpr · coronal · 0.55mm/px · 3 of 91 slices shown]
[im 23/91  soft-tissue]
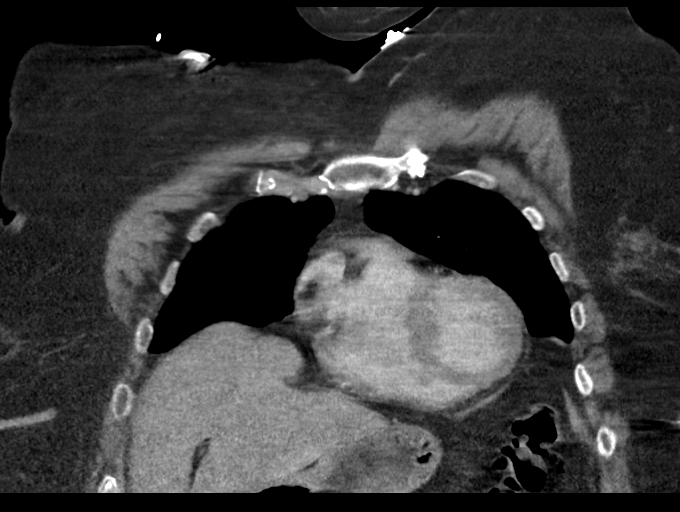
[im 46/91  soft-tissue]
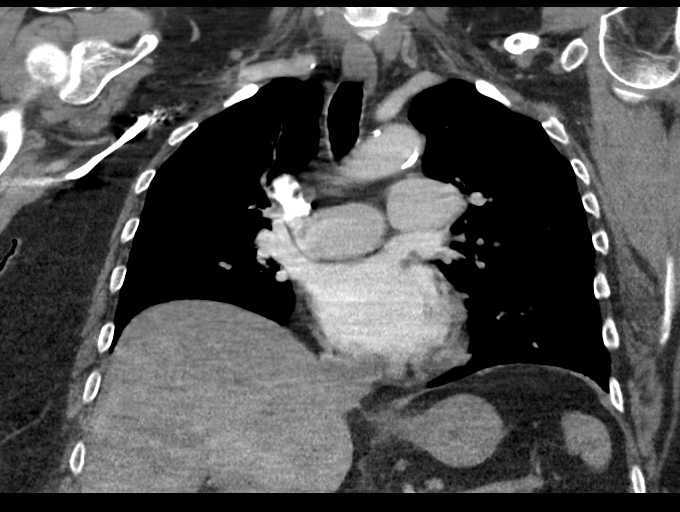
[im 68/91  soft-tissue]
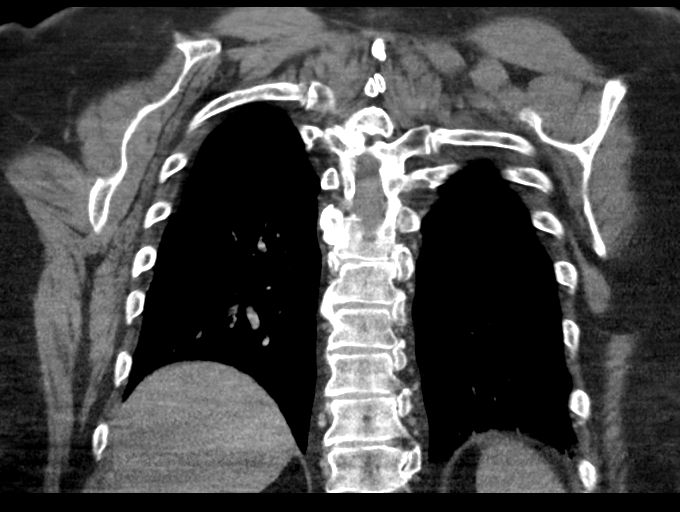

[17 of 46 positions shown; findings below may reference images not displayed]

FINDINGS: The patient's IV from the emergency room was not functioning and was
replaced, but the patient has difficult IV access which was somewhat
tenuous. During the injection, reportedly there was a small local
infiltration of contrast. My opinion, most of the 75 cc bolus went
into the venous system given the visible amount of contrast.

As result of the difficulty with venous axis, there suboptimal
contrast distribution, which is basically in the late systemic
arterial or portal venous phase. This is not ideal in assessing for
pulmonary embolus, but based on the problematic IV access I was not
confident that a better scan could be obtained.

Cardiovascular: No large central or segmental pulmonary embolus is
identified. Sensitivity for smaller pulmonary emboli is
significantly reduced due to the delayed nature of the contrast
injection as described above.

Mild cardiomegaly. Aortic valve calcification. Coronary, aortic
arch, and branch vessel atherosclerotic vascular disease.

Mediastinum/Nodes: Unremarkable

Lungs/Pleura: Mild central airway thickening.

Upper Abdomen: Unremarkable

Musculoskeletal: Thoracic spondylosis with suspected diffuse
idiopathic skeletal hyperostosis.

Review of the MIP images confirms the above findings.
IMPRESSION: 1. No filling defect is identified in the pulmonary arterial tree to
suggest pulmonary embolus. Reduced sensitivity for small pulmonary
emboli due to suboptimal bolus timing which was in part due to
difficult IV access and IV contrast infiltration.
2. Aortic Atherosclerosis ([id number]). Coronary atherosclerosis
with mild cardiomegaly and aortic valve calcification.
3. Airway thickening is present, suggesting bronchitis or reactive
airways disease.
4. Thoracic spondylosis.

## 2021-06-02 ENCOUNTER — Ambulatory Visit
Admission: RE | Admit: 2021-06-02 | Discharge: 2021-06-02 | Disposition: A | Discharge status: Home / Self Care | Payer: Medicare HMO | Source: Ambulatory Visit | Attending: Internal Medicine | Admitting: Internal Medicine

## 2021-06-02 DIAGNOSIS — Z1231 Encounter for screening mammogram for malignant neoplasm of breast: Secondary | ICD-10-CM | POA: Insufficient documentation

## 2021-06-22 ENCOUNTER — Other Ambulatory Visit
Admission: RE | Admit: 2021-06-22 | Discharge: 2021-06-22 | Disposition: A | Discharge status: Home / Self Care | Payer: Medicare HMO | Source: Ambulatory Visit | Attending: Cardiology | Admitting: Cardiology

## 2021-06-22 DIAGNOSIS — I350 Nonrheumatic aortic (valve) stenosis: Secondary | ICD-10-CM | POA: Diagnosis present

## 2021-06-22 DIAGNOSIS — R600 Localized edema: Secondary | ICD-10-CM | POA: Diagnosis present

## 2021-06-22 LAB — BRAIN NATRIURETIC PEPTIDE: B Natriuretic Peptide: 114.7 pg/mL — ABNORMAL HIGH (ref 0.0–100.0)

## 2021-07-18 ENCOUNTER — Other Ambulatory Visit
Admission: RE | Admit: 2021-07-18 | Discharge: 2021-07-18 | Disposition: A | Discharge status: Home / Self Care | Payer: Medicare HMO | Source: Ambulatory Visit | Attending: Cardiology | Admitting: Cardiology

## 2021-07-18 DIAGNOSIS — R0602 Shortness of breath: Secondary | ICD-10-CM | POA: Insufficient documentation

## 2021-07-18 DIAGNOSIS — I350 Nonrheumatic aortic (valve) stenosis: Secondary | ICD-10-CM | POA: Insufficient documentation

## 2021-07-18 LAB — BRAIN NATRIURETIC PEPTIDE: B Natriuretic Peptide: 138.4 pg/mL — ABNORMAL HIGH (ref 0.0–100.0)

## 2021-10-24 ENCOUNTER — Encounter (INDEPENDENT_AMBULATORY_CARE_PROVIDER_SITE_OTHER): Payer: Self-pay | Admitting: Vascular Surgery

## 2021-10-24 ENCOUNTER — Ambulatory Visit (INDEPENDENT_AMBULATORY_CARE_PROVIDER_SITE_OTHER): Payer: Medicare HMO | Admitting: Vascular Surgery

## 2021-10-24 VITALS — BP 132/73 | HR 49 | Resp 18 | Ht 70.0 in | Wt 292.2 lb

## 2021-10-24 DIAGNOSIS — M79605 Pain in left leg: Secondary | ICD-10-CM

## 2021-10-24 DIAGNOSIS — I10 Essential (primary) hypertension: Secondary | ICD-10-CM | POA: Diagnosis not present

## 2021-10-24 DIAGNOSIS — I6523 Occlusion and stenosis of bilateral carotid arteries: Secondary | ICD-10-CM

## 2021-10-24 DIAGNOSIS — M7989 Other specified soft tissue disorders: Secondary | ICD-10-CM

## 2021-10-24 DIAGNOSIS — I83813 Varicose veins of bilateral lower extremities with pain: Secondary | ICD-10-CM

## 2021-10-24 DIAGNOSIS — E785 Hyperlipidemia, unspecified: Secondary | ICD-10-CM

## 2021-10-24 DIAGNOSIS — M79609 Pain in unspecified limb: Secondary | ICD-10-CM | POA: Insufficient documentation

## 2021-10-24 DIAGNOSIS — M79661 Pain in right lower leg: Secondary | ICD-10-CM

## 2021-10-24 DIAGNOSIS — M79604 Pain in right leg: Secondary | ICD-10-CM

## 2021-10-24 NOTE — Assessment & Plan Note (Signed)
Recent duplex by her primary care physician demonstrated 1 to 49% right ICA stenosis and 50 to 69% left ICA stenosis.  The velocities were just into that range on the left.  We discussed that this is below the threshold for prophylactic repair, but will need to be followed regularly.  She is on aspirin and statin agent and should continue these.  I will check her back in 6 months with noninvasive studies.

## 2021-10-24 NOTE — Progress Notes (Signed)
Patient ID: Brianna Dalton, female   DOB: [birth year], 76 y.o.   MRN: [id number]  No chief complaint on file.   HPI Brianna Dalton is a 76 y.o. female.  I am asked to see the patient by Dr. Doy Hutching for evaluation of carotid stenosis.  We had checked her carotids a couple of years ago and that time her carotid disease was mild on each side. Recent duplex by her primary care physician demonstrated 1 to 49% right ICA stenosis and 50 to 69% left ICA stenosis.  The velocities were just into that range on the left.  She does not have any recent focal neurologic symptoms. Specifically, the patient denies amaurosis fugax, speech or swallowing difficulties, or arm or leg weakness or numbness.  She is on aspirin and a statin agent. The patient has more complaints today about her legs.  She complains of worsening swelling and purplish discoloration of her feet and ankles.  She has previously undergone treatment for venous insufficiency.  She also says her legs are cold and they hurt her and tired easily.  Her walking has been affected.  No open wounds or signs of infection.  No fevers or chills.   Past Medical History:  Diagnosis Date   Angina pectoris syndrome ([place])    Arterial hemorrhage    Lt ankle possible venous bleed   Arthritis    Hypertension    Hypothyroidism    Neuropathy    Varicose vein of leg     Past Surgical History:  Procedure Laterality Date   ABDOMINAL HYSTERECTOMY     BREAST BIOPSY Right 01/19/2013   stereo biopsy of two areas neg   CHOLECYSTECTOMY N/A 03/28/2019   Procedure: LAPAROSCOPIC CHOLECYSTECTOMY;  Surgeon: Olean Ree, MD;  Location: [facility];  Service: General;  Laterality: N/A;   JOINT REPLACEMENT     lt knee   KNEE ARTHROPLASTY Right 04/27/2015   Procedure: COMPUTER ASSISTED TOTAL KNEE ARTHROPLASTY;  Surgeon: Dereck Leep, MD;  Location: [facility] OR[facility];  Service: Orthopedics;  Laterality: Right;   LEFT HEART CATH AND CORONARY ANGIOGRAPHY N/A 04/10/2017   Procedure:  LEFT HEART CATH AND CORONARY ANGIOGRAPHY;  Surgeon: Teodoro Spray, MD;  Location: [facility];  Service: Cardiovascular;  Laterality: N/A;   SHOULDER ARTHROSCOPY Left    THYROID SURGERY Bilateral    TUMOR REMOVAL Right    neck     Family History  Problem Relation Age of Onset   Breast cancer Sister   No bleeding or clotting disorders No aneurysms   Social History   Tobacco Use   Smoking status: Never   Smokeless tobacco: Never  Substance Use Topics   Alcohol use: No   Drug use: No     Allergies  Allergen Reactions   Oxycodone-Acetaminophen Hives, Rash, Shortness Of Breath and Swelling   Strawberry Flavor Shortness Of Breath and Rash   Aloe Vera Hives   Amlodipine    Hydrocodone Hives   Latex     NEGATIVE BY IgE (<0.10)   Morphine And Related Other (See Comments)    Nausea delusion    Penicillins Hives    Has patient had a PCN reaction causing immediate rash, facial/tongue/throat swelling, SOB or lightheadedness with hypotension: Yes Has patient had a PCN reaction causing severe rash involving mucus membranes or skin necrosis: No Has patient had a PCN reaction that required hospitalization: No Has patient had a PCN reaction occurring within the last 10 years: No If all of  the above answers are "NO", then may proceed with Cephalosporin use.   Meperidine Rash    Current Outpatient Medications  Medication Sig Dispense Refill   allopurinol (ZYLOPRIM) 300 MG tablet Take 300 mg by mouth daily.  11   aspirin EC 81 MG tablet Take 81 mg by mouth daily.     colchicine 0.6 MG tablet Take 0.6 mg by mouth as needed.      ergocalciferol (VITAMIN D2) 1.25 MG (50000 UT) capsule Take by mouth.     gabapentin (NEURONTIN) 300 MG capsule Take 300 mg by mouth 2 (two) times daily.     KLOR-CON M20 20 MEQ tablet Take 20 mEq by mouth 2 (two) times daily.     levothyroxine (SYNTHROID) 125 MCG tablet Take 125 mcg by mouth daily before breakfast.      losartan (COZAAR) 50 MG  tablet Hold until followup with outpatient doctor since cardiologist started you on Aldactone.     metoprolol tartrate (LOPRESSOR) 25 MG tablet Take 12.5 mg by mouth 2 (two) times daily.     rosuvastatin (CRESTOR) 10 MG tablet Take 10 mg by mouth daily.     torsemide (DEMADEX) 10 MG tablet Take 10 mg by mouth daily as needed.     traMADol (ULTRAM) 50 MG tablet Take 1-2 tablets (50-100 mg total) by mouth every 4 (four) hours as needed for moderate pain. 30 tablet 0   spironolactone (ALDACTONE) 25 MG tablet Take 1 tablet (25 mg total) by mouth daily. 30 tablet 2   No current facility-administered medications for this visit.     REVIEW OF SYSTEMS (Negative unless checked)   Constitutional: '[]'$ Weight loss  '[]'$ Fever  '[]'$ Chills Cardiac: '[]'$ Chest pain   '[]'$ Chest pressure   '[]'$ Palpitations   '[]'$ Shortness of breath when laying flat   '[]'$ Shortness of breath at rest   '[]'$ Shortness of breath with exertion. Vascular:  '[]'$ Pain in legs with walking   '[]'$ Pain in legs at rest   '[]'$ Pain in legs when laying flat   '[]'$ Claudication   '[]'$ Pain in feet when walking  '[]'$ Pain in feet at rest  '[]'$ Pain in feet when laying flat   '[]'$ History of DVT   '[]'$ Phlebitis   '[x]'$ Swelling in legs   '[x]'$ Varicose veins   '[]'$ Non-healing ulcers Pulmonary:   '[]'$ Uses home oxygen   '[]'$ Productive cough   '[]'$ Hemoptysis   '[]'$ Wheeze  '[]'$ COPD   '[]'$ Asthma Neurologic:  '[]'$ Dizziness  '[]'$ Blackouts   '[]'$ Seizures   '[]'$ History of stroke   '[]'$ History of TIA  '[]'$ Aphasia   '[]'$ Temporary blindness   '[]'$ Dysphagia   '[]'$ Weakness or numbness in arms   '[]'$ Weakness or numbness in legs Musculoskeletal:  '[x]'$ Arthritis   '[]'$ Joint swelling   '[x]'$ Joint pain   '[]'$ Low back pain Hematologic:  '[]'$ Easy bruising  '[]'$ Easy bleeding   '[]'$ Hypercoagulable state   '[]'$ Anemic  '[]'$ Hepatitis Gastrointestinal:  '[]'$ Blood in stool   '[]'$ Vomiting blood  '[]'$ Gastroesophageal reflux/heartburn   '[]'$ Abdominal pain Genitourinary:  '[]'$ Chronic kidney disease   '[]'$ Difficult urination  '[]'$ Frequent urination  '[]'$ Burning with urination    '[]'$ Hematuria Skin:  '[]'$ Rashes   '[]'$ Ulcers   '[]'$ Wounds Psychological:  '[]'$ History of anxiety   '[]'$  History of major depression.    Physical Exam BP 132/73 (BP Location: Left Arm)   Pulse (!) 49   Resp 18   Ht '5\' 10"'$  (1.778 m)   Wt 292 lb 3.2 oz (132.5 kg)   BMI 41.93 kg/m  Gen:  WD/WN, NAD Head: [place]/AT, No temporalis wasting.  Ear/Nose/Throat: Hearing grossly intact, nares w/o erythema or drainage, oropharynx w/o  Erythema/Exudate Eyes: Conjunctiva clear, sclera non-icteric  Neck: trachea midline.  No JVD.  Pulmonary:  Good air movement, respirations not labored, no use of accessory muscles  Cardiac: RRR, no JVD Vascular:  Vessel Right Left  Radial Palpable Palpable                          PT 1+ 1+  DP 1+ 2+   Gastrointestinal:. No masses, surgical incisions, or scars. Musculoskeletal: M/S 5/5 throughout.  Extremities without ischemic changes.  No deformity or atrophy. Mild BLE edema. Neurologic: Sensation grossly intact in extremities.  Symmetrical.  Speech is fluent. Motor exam as listed above. Psychiatric: Judgment intact, Mood & affect appropriate for pt's clinical situation. Dermatologic: No rashes or ulcers noted.  No cellulitis or open wounds.    Radiology No results found.  Labs No results found for this or any previous visit (from the past 2160 hour(s)).  Assessment/Plan: HLD (hyperlipidemia) lipid control important in reducing the progression of atherosclerotic disease.      Morbid obesity ([place]) Worsens LE swelling   BP (high blood pressure) blood pressure control important in reducing the progression of atherosclerotic disease. On appropriate oral medications.  Pain in limb Patient has a previous history of venous insufficiency and has undergone laser ablations but does still have prominent varicosities, purplish discoloration of the legs and swelling.  She also has a lot of pain and temperature dysregulation in her legs.  Check ABIs and reflux study in  the near future at her convenience.  She wears her compression socks intermittently, we have asked her to get back in these regularly.  She also has a lymphedema pump at home that I think she should begin using regularly.  We will see her back after her noninvasive studies.  Bilateral carotid artery stenosis Recent duplex by her primary care physician demonstrated 1 to 49% right ICA stenosis and 50 to 69% left ICA stenosis.  The velocities were just into that range on the left.  We discussed that this is below the threshold for prophylactic repair, but will need to be followed regularly.  She is on aspirin and statin agent and should continue these.  I will check her back in 6 months with noninvasive studies.      Leotis Pain 10/24/2021, 12:09 PM   This note was created with Dragon medical transcription system.  Any errors from dictation are unintentional.

## 2021-10-24 NOTE — Assessment & Plan Note (Signed)
Patient has a previous history of venous insufficiency and has undergone laser ablations but does still have prominent varicosities, purplish discoloration of the legs and swelling.  She also has a lot of pain and temperature dysregulation in her legs.  Check ABIs and reflux study in the near future at her convenience.  She wears her compression socks intermittently, we have asked her to get back in these regularly.  She also has a lymphedema pump at home that I think she should begin using regularly.  We will see her back after her noninvasive studies.

## 2021-11-20 ENCOUNTER — Encounter (INDEPENDENT_AMBULATORY_CARE_PROVIDER_SITE_OTHER): Payer: Self-pay

## 2021-11-29 ENCOUNTER — Ambulatory Visit (INDEPENDENT_AMBULATORY_CARE_PROVIDER_SITE_OTHER): Payer: Medicare HMO | Admitting: Nurse Practitioner

## 2021-11-29 ENCOUNTER — Encounter (INDEPENDENT_AMBULATORY_CARE_PROVIDER_SITE_OTHER): Payer: Medicare HMO

## 2021-12-04 ENCOUNTER — Encounter (INDEPENDENT_AMBULATORY_CARE_PROVIDER_SITE_OTHER): Payer: Medicare HMO | Admitting: Vascular Surgery

## 2021-12-04 ENCOUNTER — Encounter (INDEPENDENT_AMBULATORY_CARE_PROVIDER_SITE_OTHER): Payer: Medicare HMO

## 2021-12-13 ENCOUNTER — Encounter (INDEPENDENT_AMBULATORY_CARE_PROVIDER_SITE_OTHER): Payer: Self-pay | Admitting: Nurse Practitioner

## 2021-12-13 ENCOUNTER — Ambulatory Visit (INDEPENDENT_AMBULATORY_CARE_PROVIDER_SITE_OTHER): Payer: Medicare HMO

## 2021-12-13 ENCOUNTER — Ambulatory Visit (INDEPENDENT_AMBULATORY_CARE_PROVIDER_SITE_OTHER): Payer: Medicare HMO | Admitting: Nurse Practitioner

## 2021-12-13 VITALS — BP 126/79 | HR 57 | Resp 14 | Ht 70.0 in | Wt 279.0 lb

## 2021-12-13 DIAGNOSIS — I83813 Varicose veins of bilateral lower extremities with pain: Secondary | ICD-10-CM | POA: Diagnosis not present

## 2021-12-13 DIAGNOSIS — M79605 Pain in left leg: Secondary | ICD-10-CM

## 2021-12-13 DIAGNOSIS — I10 Essential (primary) hypertension: Secondary | ICD-10-CM

## 2021-12-13 DIAGNOSIS — M179 Osteoarthritis of knee, unspecified: Secondary | ICD-10-CM | POA: Insufficient documentation

## 2021-12-13 DIAGNOSIS — M79604 Pain in right leg: Secondary | ICD-10-CM

## 2021-12-13 DIAGNOSIS — M1991 Primary osteoarthritis, unspecified site: Secondary | ICD-10-CM | POA: Insufficient documentation

## 2021-12-13 DIAGNOSIS — I6523 Occlusion and stenosis of bilateral carotid arteries: Secondary | ICD-10-CM | POA: Diagnosis not present

## 2021-12-15 ENCOUNTER — Encounter (INDEPENDENT_AMBULATORY_CARE_PROVIDER_SITE_OTHER): Payer: Self-pay | Admitting: Nurse Practitioner

## 2021-12-15 NOTE — Progress Notes (Signed)
Subjective:    Patient ID: Brianna Dalton, female    DOB: 1945-02-11, 76 y.o.   MRN: [id number] Chief Complaint  Patient presents with   Follow-up    ultrasound    Brianna Dalton is a 76 y.o. female.  She returns today for evaluation of lower extremity edema and pain.  She recently had a evaluation of her carotid stenosis which remained stable.  She notes that she is having worsening swelling of her lower extremities as well as discoloration.  She has previously had treatment for venous insufficiency in the left lower extremity.  She also notes that her legs hurt as well.  She notes that her walking is affected however is more so affected by shortness of breath.  The patient does have recently noted valvular disease and she is working with her cardiologist.  She notes that several months ago she had a fall and during this fall her left leg was twisted underneath her body in an unnatural angle.  She notes that most of the pain and discomfort is in her left lower extremity.  Since her last visit she has been wearing medical grade compression in her lower ankle area swelling is much improved.  Today noninvasive studies show an ABI of 1.29 on the right and 1.37 on the left.  The patient has triphasic tibial artery waveforms bilaterally with good toe waveforms bilaterally.  Today noninvasive studies show reflux in the right lower extremity however no evidence of DVT or superficial thrombophlebitis bilaterally.  The left lower extremity continues to have an ablated great saphenous vein.     Review of Systems  Cardiovascular:  Positive for leg swelling.  All other systems reviewed and are negative.      Objective:   Physical Exam Vitals reviewed.  HENT:     Head: Normocephalic.  Cardiovascular:     Rate and Rhythm: Normal rate.     Pulses: Normal pulses.  Pulmonary:     Effort: Pulmonary effort is normal.  Skin:    General: Skin is warm and dry.  Neurological:     Mental Status: She  is alert and oriented to person, place, and time.  Psychiatric:        Mood and Affect: Mood normal.        Behavior: Behavior normal.        Thought Content: Thought content normal.        Judgment: Judgment normal.     BP 126/79 (BP Location: Right Arm)   Pulse (!) 57   Resp 14   Ht '5\' 10"'$  (1.778 m)   Wt 279 lb (126.6 kg)   BMI 40.03 kg/m   Past Medical History:  Diagnosis Date   Angina pectoris syndrome (HCC)    Arterial hemorrhage    Lt ankle possible venous bleed   Arthritis    Hypertension    Hypothyroidism    Neuropathy    Varicose vein of leg     Social History   Socioeconomic History   Marital status: Divorced    Spouse name: Not on file   Number of children: Not on file   Years of education: Not on file   Highest education level: Not on file  Occupational History   Not on file  Tobacco Use   Smoking status: Never   Smokeless tobacco: Never  Substance and Sexual Activity   Alcohol use: No   Drug use: No   Sexual activity: Never  Other Topics Concern  Not on file  Social History Narrative   Not on file   Social Determinants of Health   Financial Resource Strain: Not on file  Food Insecurity: Not on file  Transportation Needs: Not on file  Physical Activity: Not on file  Stress: Not on file  Social Connections: Not on file  Intimate Partner Violence: Not on file    Past Surgical History:  Procedure Laterality Date   ABDOMINAL HYSTERECTOMY     BREAST BIOPSY Right 01/19/2013   stereo biopsy of two areas neg   CHOLECYSTECTOMY N/A 03/28/2019   Procedure: LAPAROSCOPIC CHOLECYSTECTOMY;  Surgeon: Olean Ree, MD;  Location: [facility];  Service: General;  Laterality: N/A;   JOINT REPLACEMENT     lt knee   KNEE ARTHROPLASTY Right 04/27/2015   Procedure: COMPUTER ASSISTED TOTAL KNEE ARTHROPLASTY;  Surgeon: Dereck Leep, MD;  Location: [facility];  Service: Orthopedics;  Laterality: Right;   LEFT HEART CATH AND CORONARY ANGIOGRAPHY N/A 04/10/2017    Procedure: LEFT HEART CATH AND CORONARY ANGIOGRAPHY;  Surgeon: Teodoro Spray, MD;  Location: [facility];  Service: Cardiovascular;  Laterality: N/A;   SHOULDER ARTHROSCOPY Left    THYROID SURGERY Bilateral    TUMOR REMOVAL Right    neck    Family History  Problem Relation Age of Onset   Breast cancer Sister     Allergies  Allergen Reactions   Oxycodone-Acetaminophen Hives, Rash, Shortness Of Breath and Swelling   Strawberry Flavor Shortness Of Breath and Rash   Aloe Vera Hives   Amlodipine    Hydrocodone Hives   Latex     NEGATIVE BY IgE (<0.10)   Morphine And Related Other (See Comments)    Nausea delusion    Penicillins Hives    Has patient had a PCN reaction causing immediate rash, facial/tongue/throat swelling, SOB or lightheadedness with hypotension: Yes Has patient had a PCN reaction causing severe rash involving mucus membranes or skin necrosis: No Has patient had a PCN reaction that required hospitalization: No Has patient had a PCN reaction occurring within the last 10 years: No If all of the above answers are "NO", then may proceed with Cephalosporin use.   Meperidine Rash       Latest Ref Rng & Units 04/11/2020    9:12 PM 04/11/2020    3:28 PM 03/28/2019    6:10 AM  CBC  WBC 4.0 - 10.5 K/uL 9.9  9.9  6.8   Hemoglobin 12.0 - 15.0 g/dL 14.1  14.4  13.9   Hematocrit 36.0 - 46.0 % 43.0  44.1  43.5   Platelets 150 - 400 K/uL 218  228  172       CMP     Component Value Date/Time   NA 139 04/11/2020 1528   NA 142 12/11/2011 1700   K 3.7 04/11/2020 1528   K 3.7 12/11/2011 1700   CL 105 04/11/2020 1528   CL 106 12/11/2011 1700   CO2 27 04/11/2020 1528   CO2 27 12/11/2011 1700   GLUCOSE 132 (H) 04/11/2020 1528   GLUCOSE 111 (H) 12/11/2011 1700   BUN 14 04/11/2020 1528   BUN 17 12/11/2011 1700   CREATININE 0.89 04/11/2020 2112   CREATININE 0.85 12/11/2011 1700   CALCIUM 9.4 04/11/2020 1528   CALCIUM 8.9 12/11/2011 1700   PROT 7.2 03/27/2019  0859   PROT 7.3 12/11/2011 1700   ALBUMIN 4.2 03/27/2019 0859   ALBUMIN 4.1 12/11/2011 1700   AST 33 03/27/2019 0859  AST 17 12/11/2011 1700   ALT 23 03/27/2019 0859   ALT 18 12/11/2011 1700   ALKPHOS 75 03/27/2019 0859   ALKPHOS 87 12/11/2011 1700   BILITOT 1.1 03/27/2019 0859   BILITOT 0.3 12/11/2011 1700   GFRNONAA >60 04/11/2020 2112   GFRNONAA >60 12/11/2011 1700   GFRAA >60 03/28/2019 0610   GFRAA >60 12/11/2011 1700     No results found.     Assessment & Plan:   1. Bilateral carotid artery stenosis Recent noninvasive studies show that it continues to be stable.  Patient will continue to follow-up in 6 months as previously scheduled.  2. Primary hypertension Continue antihypertensive medications as already ordered, these medications have been reviewed and there are no changes at this time.  3. Varicose veins of lower extremity with pain, bilateral Currently the patient is not having any issues with her right lower extremity although she does have reflux noted in this lower extremity.  The patient endorses swelling in her thigh area however however this is normal body habitus and fluid accumulation.  Ankles actually have no swelling noted at all today.  She is advised to continue working with her cardiologist and nephrologist for fluid balance control.  She is also advised to continue with use of medical grade compression.  4. Pain in both lower extremities Patient with patient description of pain it largely since her hernia in her hip..  Based on noninvasive studies today I suspect this is largely musculoskeletal in nature.  Patient advised to follow-up with her PCP for further work-up and evaluation.   Current Outpatient Medications on File Prior to Visit  Medication Sig Dispense Refill   allopurinol (ZYLOPRIM) 300 MG tablet Take 300 mg by mouth daily.  11   aspirin EC 81 MG tablet Take 81 mg by mouth daily.     colchicine 0.6 MG tablet Take 0.6 mg by mouth as needed.       ergocalciferol (VITAMIN D2) 1.25 MG (50000 UT) capsule Take by mouth.     gabapentin (NEURONTIN) 300 MG capsule Take 300 mg by mouth 2 (two) times daily.     ibuprofen (ADVIL) 800 MG tablet      KLOR-CON M20 20 MEQ tablet Take 20 mEq by mouth 2 (two) times daily.     levothyroxine (SYNTHROID) 125 MCG tablet Take 125 mcg by mouth daily before breakfast.      losartan (COZAAR) 50 MG tablet Hold until followup with outpatient doctor since cardiologist started you on Aldactone.     metoprolol tartrate (LOPRESSOR) 25 MG tablet Take 12.5 mg by mouth 2 (two) times daily.     rosuvastatin (CRESTOR) 10 MG tablet Take 10 mg by mouth daily.     torsemide (DEMADEX) 10 MG tablet Take 10 mg by mouth daily as needed.     traMADol (ULTRAM) 50 MG tablet Take 1-2 tablets (50-100 mg total) by mouth every 4 (four) hours as needed for moderate pain. 30 tablet 0   spironolactone (ALDACTONE) 25 MG tablet Take 1 tablet (25 mg total) by mouth daily. 30 tablet 2   No current facility-administered medications on file prior to visit.    There are no Patient Instructions on file for this visit. No follow-ups on file.   Kris Hartmann, NP

## 2022-02-28 ENCOUNTER — Other Ambulatory Visit: Payer: Self-pay | Admitting: Neurology

## 2022-02-28 DIAGNOSIS — S060X9S Concussion with loss of consciousness of unspecified duration, sequela: Secondary | ICD-10-CM

## 2022-03-04 ENCOUNTER — Inpatient Hospital Stay: Admission: RE | Admit: 2022-03-04 | Payer: Medicare HMO | Source: Ambulatory Visit

## 2022-03-07 ENCOUNTER — Ambulatory Visit
Admission: RE | Admit: 2022-03-07 | Discharge: 2022-03-07 | Disposition: A | Discharge status: Home / Self Care | Payer: Medicare HMO | Source: Ambulatory Visit | Attending: Neurology | Admitting: Neurology

## 2022-03-07 DIAGNOSIS — S060X9S Concussion with loss of consciousness of unspecified duration, sequela: Secondary | ICD-10-CM | POA: Diagnosis present

## 2022-04-24 ENCOUNTER — Ambulatory Visit (INDEPENDENT_AMBULATORY_CARE_PROVIDER_SITE_OTHER): Payer: Medicare HMO | Admitting: Vascular Surgery

## 2022-04-24 ENCOUNTER — Encounter (INDEPENDENT_AMBULATORY_CARE_PROVIDER_SITE_OTHER): Payer: Medicare HMO

## 2022-06-05 ENCOUNTER — Encounter (INDEPENDENT_AMBULATORY_CARE_PROVIDER_SITE_OTHER): Payer: Medicare HMO

## 2022-06-05 ENCOUNTER — Ambulatory Visit (INDEPENDENT_AMBULATORY_CARE_PROVIDER_SITE_OTHER): Payer: Medicare HMO | Admitting: Vascular Surgery

## 2022-06-25 ENCOUNTER — Other Ambulatory Visit: Payer: Self-pay | Admitting: Internal Medicine

## 2022-06-25 DIAGNOSIS — Z1231 Encounter for screening mammogram for malignant neoplasm of breast: Secondary | ICD-10-CM

## 2022-10-03 ENCOUNTER — Ambulatory Visit (INDEPENDENT_AMBULATORY_CARE_PROVIDER_SITE_OTHER): Payer: Medicare HMO | Admitting: Nurse Practitioner

## 2022-10-03 ENCOUNTER — Encounter (INDEPENDENT_AMBULATORY_CARE_PROVIDER_SITE_OTHER): Payer: Medicare HMO

## 2022-10-22 ENCOUNTER — Other Ambulatory Visit (INDEPENDENT_AMBULATORY_CARE_PROVIDER_SITE_OTHER): Payer: Self-pay | Admitting: Vascular Surgery

## 2022-10-22 DIAGNOSIS — I6523 Occlusion and stenosis of bilateral carotid arteries: Secondary | ICD-10-CM

## 2022-10-29 ENCOUNTER — Ambulatory Visit (INDEPENDENT_AMBULATORY_CARE_PROVIDER_SITE_OTHER): Payer: Medicare HMO | Admitting: Nurse Practitioner

## 2022-10-29 ENCOUNTER — Encounter (INDEPENDENT_AMBULATORY_CARE_PROVIDER_SITE_OTHER): Payer: Medicare HMO

## 2022-11-15 ENCOUNTER — Emergency Department: Payer: Medicare HMO

## 2022-11-15 ENCOUNTER — Other Ambulatory Visit: Payer: Self-pay

## 2022-11-15 ENCOUNTER — Emergency Department
Admission: EM | Admit: 2022-11-15 | Discharge: 2022-11-15 | Disposition: A | Discharge status: Home / Self Care | Payer: Medicare HMO | Attending: Emergency Medicine | Admitting: Emergency Medicine

## 2022-11-15 DIAGNOSIS — I060 Rheumatic aortic stenosis: Secondary | ICD-10-CM | POA: Insufficient documentation

## 2022-11-15 DIAGNOSIS — R42 Dizziness and giddiness: Secondary | ICD-10-CM | POA: Insufficient documentation

## 2022-11-15 DIAGNOSIS — R519 Headache, unspecified: Secondary | ICD-10-CM | POA: Diagnosis present

## 2022-11-15 DIAGNOSIS — I10 Essential (primary) hypertension: Secondary | ICD-10-CM | POA: Diagnosis not present

## 2022-11-15 DIAGNOSIS — R002 Palpitations: Secondary | ICD-10-CM | POA: Insufficient documentation

## 2022-11-15 DIAGNOSIS — E039 Hypothyroidism, unspecified: Secondary | ICD-10-CM | POA: Diagnosis not present

## 2022-11-15 DIAGNOSIS — I350 Nonrheumatic aortic (valve) stenosis: Secondary | ICD-10-CM

## 2022-11-15 LAB — BASIC METABOLIC PANEL
Anion gap: 10 (ref 5–15)
BUN: 18 mg/dL (ref 8–23)
CO2: 26 mmol/L (ref 22–32)
Calcium: 9.5 mg/dL (ref 8.9–10.3)
Chloride: 104 mmol/L (ref 98–111)
Creatinine, Ser: 0.96 mg/dL (ref 0.44–1.00)
GFR, Estimated: >60 mL/min (ref >60)
Glucose, Bld: 112 mg/dL — ABNORMAL HIGH (ref 70–99)
Potassium: 4.2 mmol/L (ref 3.5–5.1)
Sodium: 140 mmol/L (ref 135–145)

## 2022-11-15 LAB — CBC WITH DIFFERENTIAL/PLATELET
Abs Immature Granulocytes: 0.02 10*3/uL (ref 0.00–0.07)
Basophils Absolute: 0 10*3/uL (ref 0.0–0.1)
Basophils Relative: 1 %
Eosinophils Absolute: 0.1 10*3/uL (ref 0.0–0.5)
Eosinophils Relative: 1 %
HCT: 41.6 % (ref 36.0–46.0)
Hemoglobin: 14.1 g/dL (ref 12.0–15.0)
Immature Granulocytes: 0 %
Lymphocytes Relative: 20 %
Lymphs Abs: 1.6 10*3/uL (ref 0.7–4.0)
MCH: 30.3 pg (ref 26.0–34.0)
MCHC: 33.9 g/dL (ref 30.0–36.0)
MCV: 89.5 fL (ref 80.0–100.0)
Monocytes Absolute: 0.5 10*3/uL (ref 0.1–1.0)
Monocytes Relative: 6 %
Neutro Abs: 5.7 10*3/uL (ref 1.7–7.7)
Neutrophils Relative %: 72 %
Platelets: 155 10*3/uL (ref 150–400)
RBC: 4.65 MIL/uL (ref 3.87–5.11)
RDW: 14 % (ref 11.5–15.5)
WBC: 7.9 10*3/uL (ref 4.0–10.5)
nRBC: 0 % (ref 0.0–0.2)

## 2022-11-15 LAB — TROPONIN I (HIGH SENSITIVITY)
Troponin I (High Sensitivity): 19 ng/L — ABNORMAL HIGH (ref <18)
Troponin I (High Sensitivity): 36 ng/L — ABNORMAL HIGH (ref <18)

## 2022-11-15 NOTE — ED Notes (Signed)
See triage note, pt reports was woken up at 0400 today with headache and chest pressure. Reports has a brain disease.

## 2022-11-15 NOTE — ED Triage Notes (Signed)
Patient c/o throbbing head pressure and became light headed, then felt like your heart racing and became dizzy; She no longer feels like her heart is racing but still has head pressure; h/o small cell brain disease, recently had an extensive nerve block donew on her "neck"

## 2022-11-15 NOTE — ED Triage Notes (Addendum)
First Nurse Note;  Pt via [facility] from home. Pt c/o CP and felt like her heart racing that started around 0400. Pt had nerve block in her neck. Also reports headache. Pt is A&Ox4 and NAD 220/120 BP  EMS gave 1 spray of Nitroglycerin  98 CBG  SB on 12 lead  20 G L AC  98% and RA

## 2022-11-15 NOTE — ED Provider Notes (Signed)
[facility] Provider Note   Event Date/Time   First MD Initiated Contact with Patient 11/15/22 1316     (approximate) History  Headache and Palpitations  HPI Brianna Dalton is a 77 y.o. female with a stated past medical history of hypothyroidism, neuropathy, hypertension, moderate aortic stenosis, and small cell brain disease who presents complaining of headache, intermittent lightheadedness, and palpitations.  Patient states that these symptoms occur usually and change in position when sitting to standing up.  Patient denies any other exacerbating relieving factors for the symptoms patient currently denies any vision changes, tinnitus, difficulty speaking, facial droop, sore throat, chest pain, shortness of breath, abdominal pain, nausea/vomiting/diarrhea, dysuria, or weakness/numbness/paresthesias in any extremity ROS: Patient currently denies any vision changes, tinnitus, difficulty speaking, facial droop, sore throat, chest pain, shortness of breath, abdominal pain, nausea/vomiting/diarrhea, dysuria, or weakness/numbness/paresthesias in any extremity   Physical Exam  Triage Vital Signs: ED Triage Vitals  Encounter Vitals Group     BP 11/15/22 1057 (!) 197/102     Systolic BP Percentile --      Diastolic BP Percentile --      Pulse Rate 11/15/22 1057 (!) 48     Resp 11/15/22 1057 16     Temp 11/15/22 1057 98.3 F (36.8 C)     Temp Source 11/15/22 1057 Oral     SpO2 11/15/22 1057 99 %     Weight 11/15/22 1057 270 lb (122.5 kg)     Height 11/15/22 1057 5\' 10"  (1.778 m)     Head Circumference --      Peak Flow --      Pain Score 11/15/22 1118 7     Pain Loc --      Pain Education --      Exclude from Growth Chart --    Most recent vital signs: Vitals:   11/15/22 1315 11/15/22 1400  BP: (!) 184/72 (!) 164/71  Pulse: 97 (!) 44  Resp: 18 20  Temp:    SpO2: 100% 100%   General: Awake, oriented x4. CV:  Good peripheral perfusion.  Resp:  Normal  effort.  Crescendo decrescendo holosystolic murmur Abd:  No distention.  Other:  Elderly obese Caucasian female resting comfortably in no acute distress ED Results / Procedures / Treatments  Labs (all labs ordered are listed, but only abnormal results are displayed) Labs Reviewed  BASIC METABOLIC PANEL - Abnormal; Notable for the following components:      Result Value   Glucose, Bld 112 (*)    All other components within normal limits  TROPONIN I (HIGH SENSITIVITY) - Abnormal; Notable for the following components:   Troponin I (High Sensitivity) 19 (*)    All other components within normal limits  TROPONIN I (HIGH SENSITIVITY) - Abnormal; Notable for the following components:   Troponin I (High Sensitivity) 36 (*)    All other components within normal limits  CBC WITH DIFFERENTIAL/PLATELET   EKG ED ECG REPORT I, Merwyn Katos, the attending physician, personally viewed and interpreted this ECG. Date: 11/15/2022 EKG Time: 1103 Rate: 48 Rhythm: Bradycardic sinus rhythm QRS Axis: normal Intervals: normal ST/T Wave abnormalities: normal Narrative Interpretation: Bradycardic sinus rhythm.  No evidence of acute ischemia RADIOLOGY ED MD interpretation: 2 view chest x-ray shows mild basilar scarring versus atelectasis  CT of the head without contrast interpreted by me shows no evidence of acute abnormalities including no intracerebral hemorrhage, obvious masses, or significant edema -Agree with radiology assessment Official radiology report(s):  DG Chest 2 View  Result Date: 11/15/2022 CLINICAL DATA:  Palpitations EXAM: CHEST - 2 VIEW COMPARISON:  X-ray 04/11/2020 FINDINGS: There is some linear opacity lung bases likely scar or atelectasis. No consolidation, pneumothorax or effusion. No edema. Normal cardiopericardial silhouette. Eventration of the right hemidiaphragm. Degenerative changes seen of the spine on lateral view. IMPRESSION: Mild basilar scar or atelectasis. Electronically  Signed   By: Karen Kays M.D.   On: 11/15/2022 12:56   CT Head Wo Contrast  Result Date: 11/15/2022 CLINICAL DATA:  Headache/head pressure after nerve block and neck. EXAM: CT HEAD WITHOUT CONTRAST TECHNIQUE: Contiguous axial images were obtained from the base of the skull through the vertex without intravenous contrast. RADIATION DOSE REDUCTION: This exam was performed according to the departmental dose-optimization program which includes automated exposure control, adjustment of the mA and/or kV according to patient size and/or use of iterative reconstruction technique. COMPARISON:  Brain MRI 03/07/2022 FINDINGS: Brain: There is no acute intracranial hemorrhage, extra-axial fluid collection, or acute infarct. Parenchymal volume is normal. The ventricles are normal in size. Gray-white differentiation is preserved. The pituitary and suprasellar region are normal. There is no mass lesion. There is no mass effect or midline shift. Vascular: There is calcification of the bilateral carotid siphons. Skull: Normal. Negative for fracture or focal lesion. Sinuses/Orbits: The imaged paranasal sinuses are clear. The globes and orbits are unremarkable. Other: The mastoid air cells and middle ear cavities are clear. IMPRESSION: No acute intracranial pathology. Electronically Signed   By: Lesia Hausen M.D.   On: 11/15/2022 12:45   PROCEDURES: Critical Care performed: No .1-3 Lead EKG Interpretation  Performed by: Merwyn Katos, MD Authorized by: Merwyn Katos, MD     Interpretation: normal     ECG rate:  61   ECG rate assessment: normal     Rhythm: sinus rhythm     Ectopy: none     Conduction: normal    MEDICATIONS ORDERED IN ED: Medications - No data to display IMPRESSION / MDM / ASSESSMENT AND PLAN / ED COURSE  I reviewed the triage vital signs and the nursing notes.                             The patient is on the cardiac monitor to evaluate for evidence of arrhythmia and/or significant heart  rate changes. Patient's presentation is most consistent with acute presentation with potential threat to life or bodily function. Patient presents with complaints of presyncope ED Workup:  CBC, BMP, Troponin, ECG, CXR Differential diagnosis includes HF, ICH, seizure, stroke, HOCM, ACS, aortic dissection, malignant arrhythmia, or GI bleed. Findings: No evidence of acute laboratory abnormalities.  Troponin negative x1 EKG: No e/o STEMI. No evidence of Brugada's sign, delta wave, epsilon wave, significantly prolonged QTc, or malignant arrhythmia. Concern the patient has moderate aortic stenosis and is having worsening symptoms with exertion as well as orthostatic changes.  Encouraged to follow-up with Dr. Darrold Junker in the outpatient setting for further evaluation and management Disposition: Discharge. Patient is at baseline at this time. Return precautions expressed and understood in person. Advised follow up with primary care provider or clinic physician in next 24 hours.   FINAL CLINICAL IMPRESSION(S) / ED DIAGNOSES   Final diagnoses:  Palpitations  Bad headache  Moderate aortic stenosis  Episodic lightheadedness   Rx / DC Orders   ED Discharge Orders          Ordered  Ambulatory referral to Cardiology       Comments: If you have not heard from the Cardiology office within the next 72 hours please call [phone].   11/15/22 1358           Note:  This document was prepared using Dragon voice recognition software and may include unintentional dictation errors.   Merwyn Katos, MD 11/15/22 [phone]

## 2022-11-15 NOTE — Discharge Instructions (Addendum)
Please speak to your cardiologist soon as possible about adjustments of your medications given bradycardia (low heart rate) at your visit today

## 2023-03-06 ENCOUNTER — Other Ambulatory Visit: Payer: Self-pay | Admitting: Student

## 2023-03-06 DIAGNOSIS — R519 Headache, unspecified: Secondary | ICD-10-CM

## 2023-03-06 DIAGNOSIS — M542 Cervicalgia: Secondary | ICD-10-CM

## 2023-03-06 DIAGNOSIS — M7918 Myalgia, other site: Secondary | ICD-10-CM

## 2023-03-15 ENCOUNTER — Ambulatory Visit: Payer: Medicare HMO

## 2023-03-25 ENCOUNTER — Ambulatory Visit
Admission: RE | Admit: 2023-03-25 | Discharge: 2023-03-25 | Disposition: A | Discharge status: Home / Self Care | Payer: Medicare HMO | Source: Ambulatory Visit | Attending: Student | Admitting: Student

## 2023-03-25 DIAGNOSIS — M542 Cervicalgia: Secondary | ICD-10-CM | POA: Insufficient documentation

## 2023-03-25 DIAGNOSIS — R519 Headache, unspecified: Secondary | ICD-10-CM | POA: Diagnosis present

## 2023-03-25 DIAGNOSIS — M7918 Myalgia, other site: Secondary | ICD-10-CM | POA: Diagnosis present

## 2023-04-03 ENCOUNTER — Inpatient Hospital Stay
Admission: RE | Admit: 2023-04-03 | Discharge: 2023-04-03 | Disposition: A | Discharge status: Home / Self Care | Payer: Self-pay | Source: Ambulatory Visit | Attending: Physician Assistant | Admitting: Physician Assistant

## 2023-04-03 ENCOUNTER — Other Ambulatory Visit: Payer: Self-pay | Admitting: Family Medicine

## 2023-04-03 DIAGNOSIS — Z049 Encounter for examination and observation for unspecified reason: Secondary | ICD-10-CM

## 2023-04-03 NOTE — Progress Notes (Signed)
 Referring Physician:  Janice Coffin, PA-C [address] [place],  [place] [address]  Primary Physician:  Marguarite Arbour, MD  History of Present Illness: 04/08/2023 [name] is here today with a chief complaint of neck stiffness.  She has had many falls over the past year.  She does not feel as though she has a lot of neck pain, but the stiffness is such that she feels that she has quite a bit of limited range of motion.  She feels as though she cannot turn her neck all the way and that the muscles in the front of her neck are very tight.  Periodically she will have numbness and tingling that radiates into her left arm and to digits 2-5 however she says that this is not consistent and is not her largest complaint.  She denies any issues with her bowel or bladder.  She has undergone occipital nerve block as well as trigger point ejections in her neck which the trigger point injections relieved some pain for short amount of time, but neither provided any sort of lasting relief.  She does complain of headaches that radiates up to the crown of her head.   Conservative measures:  Physical therapy: has not participated in PT  Multimodal medical therapy including regular antiinflammatories: Tramadol, Ibuprofen, Gabapentin  Injections: no epidural steroid injections 11/08/2022, 08/01/2022, 2/26/2024Occipital Nerve Block and Trigger Point Injections  Past Surgery: none  Brianna Dalton has no symptoms of cervical myelopathy.  The symptoms are causing a significant impact on the patient's life.   Review of Systems:  A 10 point review of systems is negative, except for the pertinent positives and negatives detailed in the HPI.  Past Medical History: Past Medical History:  Diagnosis Date   Angina pectoris syndrome (HCC)    Arterial hemorrhage    Lt ankle possible venous bleed   Arthritis    Hypertension    Hypothyroidism    Neuropathy    Varicose vein of leg     Past  Surgical History: Past Surgical History:  Procedure Laterality Date   ABDOMINAL HYSTERECTOMY     BREAST BIOPSY Right 01/19/2013   stereo biopsy of two areas neg   CHOLECYSTECTOMY N/A 03/28/2019   Procedure: LAPAROSCOPIC CHOLECYSTECTOMY;  Surgeon: Henrene Dodge, MD;  Location: [facility];  Service: General;  Laterality: N/A;   JOINT REPLACEMENT     lt knee   KNEE ARTHROPLASTY Right 04/27/2015   Procedure: COMPUTER ASSISTED TOTAL KNEE ARTHROPLASTY;  Surgeon: Donato Heinz, MD;  Location: [facility];  Service: Orthopedics;  Laterality: Right;   LEFT HEART CATH AND CORONARY ANGIOGRAPHY N/A 04/10/2017   Procedure: LEFT HEART CATH AND CORONARY ANGIOGRAPHY;  Surgeon: Dalia Heading, MD;  Location: [facility];  Service: Cardiovascular;  Laterality: N/A;   SHOULDER ARTHROSCOPY Left    THYROID SURGERY Bilateral    TUMOR REMOVAL Right    neck    Allergies: Allergies as of 04/05/2023 - Review Complete 04/05/2023  Allergen Reaction Noted   Oxycodone-acetaminophen Hives, Rash, Shortness Of Breath, and Swelling 07/05/2013   Strawberry flavoring agent (non-screening) Shortness Of Breath and Rash 03/27/2019   Aloe vera Hives 07/05/2013   Amlodipine  04/09/2017   Hydrocodone Hives 04/14/2015   Latex  04/14/2015   Morphine and codeine Other (See Comments) 04/14/2015   Penicillins Hives 04/14/2015   Meperidine Rash 07/05/2013    Medications: Outpatient Encounter Medications as of 04/05/2023  Medication Sig   allopurinol (ZYLOPRIM) 300 MG  tablet Take 300 mg by mouth daily.   aspirin EC 81 MG tablet Take 81 mg by mouth daily.   baclofen (LIORESAL) 10 MG tablet Take 0.5 tablets (5 mg total) by mouth 2 (two) times daily as needed for muscle spasms.   celecoxib (CELEBREX) 100 MG capsule Take by mouth.   colchicine 0.6 MG tablet Take 0.6 mg by mouth as needed.    ergocalciferol (VITAMIN D2) 1.25 MG (50000 UT) capsule Take by mouth.   gabapentin (NEURONTIN) 300 MG capsule Take 300 mg by mouth 2  (two) times daily.   isosorbide mononitrate (IMDUR) 30 MG 24 hr tablet Take 30 mg by mouth daily.   levothyroxine (SYNTHROID) 125 MCG tablet Take 125 mcg by mouth daily before breakfast.    losartan (COZAAR) 50 MG tablet Hold until followup with outpatient doctor since cardiologist started you on Aldactone.   metoprolol tartrate (LOPRESSOR) 25 MG tablet Take 12.5 mg by mouth 2 (two) times daily.   torsemide (DEMADEX) 10 MG tablet Take 10 mg by mouth daily as needed.   traMADol (ULTRAM) 50 MG tablet Take 1-2 tablets (50-100 mg total) by mouth every 4 (four) hours as needed for moderate pain.   [DISCONTINUED] ibuprofen (ADVIL) 800 MG tablet    [DISCONTINUED] KLOR-CON M20 20 MEQ tablet Take 20 mEq by mouth 2 (two) times daily.   [DISCONTINUED] rosuvastatin (CRESTOR) 10 MG tablet Take 10 mg by mouth daily.   [DISCONTINUED] spironolactone (ALDACTONE) 25 MG tablet Take 1 tablet (25 mg total) by mouth daily.   No facility-administered encounter medications on file as of 04/05/2023.    Social History: Social History   Tobacco Use   Smoking status: Never   Smokeless tobacco: Never  Substance Use Topics   Alcohol use: No   Drug use: No    Family Medical History: Family History  Problem Relation Age of Onset   Breast cancer Sister     Physical Examination: @VITALWITHPAIN @  General: Patient is well developed, well nourished, calm, collected, and in no apparent distress. Attention to examination is appropriate.  Psychiatric: Patient is non-anxious.  Head:  Pupils equal, round, and reactive to light.  ENT:  Oral mucosa appears well hydrated.  Neck:   Decreased range of motion and prominent sternocleidomastoid  Respiratory: Patient is breathing without any difficulty.  Extremities: No edema.  Vascular: Palpable dorsal pedal pulses.  Skin:   On exposed skin, there are no abnormal skin lesions.  NEUROLOGICAL:     Awake, alert, oriented to person, place, and time.  Speech is clear  and fluent. Fund of knowledge is appropriate.   Cranial Nerves: Pupils equal round and reactive to light.  Facial tone is symmetric.    Decreased range of motion of her cervical spine.  Prominent SCM.  Strength: Side Biceps Triceps Deltoid Interossei Grip Wrist Ext. Wrist Flex.  R 5 5 5 5 5 5 5   L 5 5 5 5 5 5 5     Reflexes are 2+ and symmetric at the biceps, triceps, brachioradialis.Hoffman's is absent.  Bilateral upper and lower extremity sensation is intact to light touch.    Gait is normal.   No difficulty with tandem gait.   No evidence of dysmetria noted.  Medical Decision Making  Imaging:  03/26/23 MRI Cervical Spine: IMPRESSION: 1. Cervical spondylosis as outlined within the body of the report. 2. At C5-C6, a posterior disc osteophyte complex contributes to mild/moderate spinal canal stenosis (with mild flattening of the ventral spinal cord). Multifactorial bilateral neural  foraminal narrowing also present at this level (severe right, moderate/severe left). 3. No significant spinal canal stenosis at the remaining levels. 4. Additional sites of foraminal stenosis, greatest on the left at C2-C3 (moderate/severe), bilaterally at C3-C4 (moderate), on the right at C4-C5 (moderate) and on the right at C6-C7 (moderate). 5. Mild multilevel disc degeneration. 6. Multilevel facet arthropathy. 7. Slight grade 1 spondylolisthesis at C5-C6 and T2-T3.   I have personally reviewed the images and agree with the above interpretation.  Assessment and Plan: Ms. Spare is a pleasant 78 y.o. female is here today with a chief complaint of neck stiffness.  She has had many falls over the past year.  She does not feel as though she has a lot of neck pain, but the stiffness is such that she feels that she has quite a bit of limited range of motion.  She feels as though she cannot turn her neck all the way and that the muscles in the front of her neck are very tight.  Periodically she will have  numbness and tingling that radiates into her left arm and to digits 2-5 however she says that this is not consistent and is not her largest complaint.  She denies any issues with her bowel or bladder.  She has undergone occipital nerve block as well as trigger point ejections in her neck which the trigger point injections relieved some pain for short amount of time, but neither provided any sort of lasting relief.  She does complain of headaches that radiates up to the crown of her head.  On examination she has full strength however does have significant decreased range of motion of her cervical spine when looking from side-to-side.  She does have a prominent SCM as noted.  She has full strength.  No Hoffmann sign.  Imaging reviewed of MRI cervical spine at length with multilevel degenerative changes as well as multilevels of foraminal stenosis as well as some mild flattening of the ventral spinal cord noted as well as C5-6.  It was a pleasure to see patient in clinic today.  She does have some significant findings on her cervical MRI however her largest complaint continues to be neck stiffness and inability to move her neck from left to right as she wishes.  She is not sure this is after one of her falls that she has had, but is impacted her life for more.  She does not feel safe driving anymore because she cannot turn her head all the way to be able to see oncoming oncoming traffic.  We discussed physical therapy as well as a muscle relaxer to help her.  In addition, I will reach out to her neurologist about thoughts on potential Botox injections for possible cervical dystonia.  Will plan to see patient back in 6 to 8 weeks.  She is encouraged reach out to me however in the meantime for any questions she has.   Thank you for involving me in the care of this patient.   Joan Flores, PA-C Dept. of Neurosurgery

## 2023-04-05 ENCOUNTER — Encounter: Payer: Self-pay | Admitting: Physician Assistant

## 2023-04-05 ENCOUNTER — Ambulatory Visit: Admitting: Physician Assistant

## 2023-04-05 VITALS — BP 146/98 | Ht 70.0 in | Wt 257.0 lb

## 2023-04-05 DIAGNOSIS — R202 Paresthesia of skin: Secondary | ICD-10-CM

## 2023-04-05 DIAGNOSIS — R937 Abnormal findings on diagnostic imaging of other parts of musculoskeletal system: Secondary | ICD-10-CM

## 2023-04-05 DIAGNOSIS — R2689 Other abnormalities of gait and mobility: Secondary | ICD-10-CM | POA: Diagnosis not present

## 2023-04-05 DIAGNOSIS — R200 Anesthesia of skin: Secondary | ICD-10-CM

## 2023-04-05 DIAGNOSIS — M4802 Spinal stenosis, cervical region: Secondary | ICD-10-CM

## 2023-04-05 DIAGNOSIS — R296 Repeated falls: Secondary | ICD-10-CM

## 2023-04-05 DIAGNOSIS — M542 Cervicalgia: Secondary | ICD-10-CM

## 2023-04-05 MED ORDER — BACLOFEN 10 MG PO TABS: 5.0000 mg | ORAL_TABLET | Freq: Two times a day (BID) | ORAL | 0 refills | Status: DC | PRN

## 2023-04-27 ENCOUNTER — Other Ambulatory Visit: Payer: Self-pay | Admitting: Physician Assistant

## 2023-06-03 ENCOUNTER — Ambulatory Visit
Admission: RE | Admit: 2023-06-03 | Discharge: 2023-06-03 | Disposition: A | Discharge status: Home / Self Care | Source: Ambulatory Visit | Attending: Physician Assistant | Admitting: Physician Assistant

## 2023-06-03 ENCOUNTER — Ambulatory Visit: Admitting: Physician Assistant

## 2023-06-03 ENCOUNTER — Encounter: Payer: Self-pay | Admitting: Physician Assistant

## 2023-06-03 ENCOUNTER — Ambulatory Visit
Admission: RE | Admit: 2023-06-03 | Discharge: 2023-06-03 | Disposition: A | Discharge status: Home / Self Care | Attending: Physician Assistant | Admitting: Physician Assistant

## 2023-06-03 VITALS — BP 128/82 | Ht 70.0 in | Wt 265.0 lb

## 2023-06-03 DIAGNOSIS — G4486 Cervicogenic headache: Secondary | ICD-10-CM

## 2023-06-03 DIAGNOSIS — M542 Cervicalgia: Secondary | ICD-10-CM

## 2023-06-03 MED ORDER — TIZANIDINE HCL 4 MG PO TABS
4.0000 mg | ORAL_TABLET | Freq: Three times a day (TID) | ORAL | 1 refills | Status: DC | PRN
Start: 2023-06-03 — End: 2023-11-14

## 2023-06-03 NOTE — Progress Notes (Signed)
 Referring Physician:  Yehuda Helms, MD [address] [facility] [place],  [place] [address]  Primary Physician:  Yehuda Helms, MD  History of Present Illness:  Brianna Dalton is here today with a chief complaint of neck stiffness.  She has had many falls over the past year.  She does not feel as though she has a lot of neck pain, but the stiffness is such that she feels that she has quite a bit of limited range of motion.  She feels as though she cannot turn her neck all the way and that the muscles in the front of her neck are very tight.  Periodically she will have numbness and tingling that radiates into her left arm and to digits 2-5 however she says that this is not consistent and is not her largest complaint.  She denies any issues with her bowel or bladder.  She has undergone occipital nerve block as well as trigger point ejections in her neck which the trigger point injections relieved some pain for short amount of time, but neither provided any sort of lasting relief.  She does complain of headaches that radiates up to the crown of her head.  06/03/2023  Patient then follow-up for her continued neck stiffness.  She continues to feel as though her range of motion is limited in her neck and that she has quite a bit of difficulty turning her head from side-to-side.  She feels as though when she does the muscles in the front of her neck tighten up quite a bit.  She states that the reason she cannot turn her neck is not secondary to pain, and that fundamentally her range of motion is lost and her neck does not move that far.  Her other main complaint are progressing headaches.  She states that they are more constant and quite debilitating.  She states that in her go up the back of her neck and covers her head.  Her neck pain does not radiate down her arms very often, she states maybe once or twice a week.  She states when it does radiate it does not go past her deltoid.   However, she does feel as though her hands are weaker.  She is having a hard time carrying things.  She does not feel that her gait has changed.  She has some longstanding left knee pain following a knee replacement, but this is unchanged.  She denies any falls.  She does have some dizziness, but is unsure if this is related to her gabapentin and muscle relaxer.  No new saddle anesthesia.  She did have a recent right C5-6 ESI that helped some.  She also had temporary relief from her occipital neuralgia and trigger point injections.    Conservative measures:  Physical therapy: has not participated in PT  Multimodal medical therapy including regular antiinflammatories: Tramadol , Ibuprofen , Gabapentin  Injections: no epidural steroid injections 11/08/2022, 08/01/2022, 2/26/2024Occipital Nerve Block and Trigger Point Injections  Past Surgery: none  SHAWONNA DRENNER has no symptoms of cervical myelopathy.  The symptoms are causing a significant impact on the patient's life.   Review of Systems:  A 10 point review of systems is negative, except for the pertinent positives and negatives detailed in the HPI.  Past Medical History: Past Medical History:  Diagnosis Date   Angina pectoris syndrome (HCC)    Arterial hemorrhage    Lt ankle possible venous bleed   Arthritis  Hypertension    Hypothyroidism    Neuropathy    Varicose vein of leg     Past Surgical History: Past Surgical History:  Procedure Laterality Date   ABDOMINAL HYSTERECTOMY     BREAST BIOPSY Right 01/19/2013   stereo biopsy of two areas neg   CHOLECYSTECTOMY N/A 03/28/2019   Procedure: LAPAROSCOPIC CHOLECYSTECTOMY;  Surgeon: Emmalene Hare, MD;  Location: [facility];  Service: General;  Laterality: N/A;   JOINT REPLACEMENT     lt knee   KNEE ARTHROPLASTY Right 04/27/2015   Procedure: COMPUTER ASSISTED TOTAL KNEE ARTHROPLASTY;  Surgeon: Arlyne Lame, MD;  Location: [facility];  Service: Orthopedics;  Laterality: Right;   LEFT  HEART CATH AND CORONARY ANGIOGRAPHY N/A 04/10/2017   Procedure: LEFT HEART CATH AND CORONARY ANGIOGRAPHY;  Surgeon: Ronney Cola, MD;  Location: [facility];  Service: Cardiovascular;  Laterality: N/A;   SHOULDER ARTHROSCOPY Left    THYROID  SURGERY Bilateral    TUMOR REMOVAL Right    neck    Allergies: Allergies as of 06/03/2023 - Review Complete 04/05/2023  Allergen Reaction Noted   Oxycodone -acetaminophen  Hives, Rash, Shortness Of Breath, and Swelling 07/05/2013   Strawberry flavoring agent (non-screening) Shortness Of Breath and Rash 03/27/2019   Aloe vera Hives 07/05/2013   Amlodipine  04/09/2017   Hydrocodone Hives 04/14/2015   Latex  04/14/2015   Morphine  and codeine Other (See Comments) 04/14/2015   Penicillins Hives 04/14/2015   Meperidine Rash 07/05/2013    Medications: Outpatient Encounter Medications as of 06/03/2023  Medication Sig   allopurinol  (ZYLOPRIM ) 300 MG tablet Take 300 mg by mouth daily.   aspirin  EC 81 MG tablet Take 81 mg by mouth daily.   baclofen  (LIORESAL ) 10 MG tablet TAKE 0.5 TABLETS (5 MG TOTAL) BY MOUTH 2 (TWO) TIMES DAILY AS NEEDED FOR MUSCLE SPASMS.   celecoxib  (CELEBREX ) 100 MG capsule Take by mouth.   colchicine  0.6 MG tablet Take 0.6 mg by mouth as needed.    ergocalciferol (VITAMIN D2) 1.25 MG (50000 UT) capsule Take by mouth.   gabapentin (NEURONTIN) 300 MG capsule Take 300 mg by mouth 2 (two) times daily.   isosorbide mononitrate (IMDUR) 30 MG 24 hr tablet Take 30 mg by mouth daily.   levothyroxine  (SYNTHROID ) 125 MCG tablet Take 125 mcg by mouth daily before breakfast.    losartan  (COZAAR ) 50 MG tablet Hold until followup with outpatient doctor since cardiologist started you on Aldactone .   metoprolol  tartrate (LOPRESSOR ) 25 MG tablet Take 12.5 mg by mouth 2 (two) times daily.   torsemide  (DEMADEX ) 10 MG tablet Take 10 mg by mouth daily as needed.   traMADol  (ULTRAM ) 50 MG tablet Take 1-2 tablets (50-100 mg total) by mouth every 4  (four) hours as needed for moderate pain.   No facility-administered encounter medications on file as of 06/03/2023.    Social History: Social History   Tobacco Use   Smoking status: Never   Smokeless tobacco: Never  Substance Use Topics   Alcohol use: No   Drug use: No    Family Medical History: Family History  Problem Relation Age of Onset   Breast cancer Sister     Physical Examination: @VITALWITHPAIN @  General: Patient is well developed, well nourished, calm, collected, and in no apparent distress. Attention to examination is appropriate.  Psychiatric: Patient is non-anxious.  Head:  Pupils equal, round, and reactive to light.  ENT:  Oral mucosa appears well hydrated.  Neck:   Decreased range of motion  and prominent sternocleidomastoid  Respiratory: Patient is breathing without any difficulty.  Extremities: No edema.  Vascular: Palpable dorsal pedal pulses.  Skin:   On exposed skin, there are no abnormal skin lesions.  NEUROLOGICAL:     Awake, alert, oriented to person, place, and time.  Speech is clear and fluent. Fund of knowledge is appropriate.   Cranial Nerves: Pupils equal round and reactive to light.  Facial tone is symmetric.    Decreased range of motion of her cervical spine.  Prominent SCM.  Strength: Side Biceps Triceps Deltoid Interossei Grip Wrist Ext. Wrist Flex.  R 5 5 5  4+ 4 5 5   L 5 5 5  4+ 4+ 5 5    Reflexes are 2+ and symmetric at the biceps, triceps, brachioradialis.Hoffman's is absent.  Bilateral upper and lower extremity sensation is intact to light touch.    Gait is normal.   No difficulty with tandem gait.   No evidence of dysmetria noted.  Medical Decision Making  Imaging:  03/26/23 MRI Cervical Spine: IMPRESSION: 1. Cervical spondylosis as outlined within the body of the report. 2. At C5-C6, a posterior disc osteophyte complex contributes to mild/moderate spinal canal stenosis (with mild flattening of the ventral spinal  cord). Multifactorial bilateral neural foraminal narrowing also present at this level (severe right, moderate/severe left). 3. No significant spinal canal stenosis at the remaining levels. 4. Additional sites of foraminal stenosis, greatest on the left at C2-C3 (moderate/severe), bilaterally at C3-C4 (moderate), on the right at C4-C5 (moderate) and on the right at C6-C7 (moderate). 5. Mild multilevel disc degeneration. 6. Multilevel facet arthropathy. 7. Slight grade 1 spondylolisthesis at C5-C6 and T2-T3.   I have personally reviewed the images and agree with the above interpretation.  Assessment and Plan: Ms. Mikkelsen is a pleasant 78 y.o. female here for follow-up for her continued neck stiffness.  She continues to feel as though her range of motion is limited in her neck and that she has quite a bit of difficulty turning her head from side-to-side.  She feels as though when she does the muscles in the front of her neck tighten up quite a bit.  She states that the reason she cannot turn her neck is not secondary to pain, and that fundamentally her range of motion is lost and her neck does not move that far.  Her other main complaint are progressing headaches.  She states that they are more constant and quite debilitating.  She states that in her go up the back of her neck and covers her head.  Her neck pain does not radiate down her arms very often, she states maybe once or twice a week.  She states when it does radiate it does not go past her deltoid.  However, she does feel as though her hands are weaker.  She is having a hard time carrying things.  She does not feel that her gait has changed.  She has some longstanding left knee pain following a knee replacement, but this is unchanged.  She denies any falls.  She does have some dizziness, but is unsure if this is related to her gabapentin and muscle relaxer.  No new saddle anesthesia.  She did have a recent right C5-6 ESI that helped some.  She  also had temporary relief from her occipital neuralgia and trigger point injections.  On examination she has some mild intrinsic weakness as well as significant decreased range of motion of her cervical spine when looking from side-to-side.  She does have a prominent SCM as noted.   No Hoffmann sign.  Imaging reviewed of MRI cervical spine at length with multilevel degenerative changes as well as multilevels of foraminal stenosis as well as some mild flattening of the ventral spinal cord noted as well as C5-6.  It was a pleasure to see patient in clinic today.  As stated, patient does have some flattening of the ventral spinal cord with moderate spinal canal stenosis noted.  Thi very well could be causing cervicogenic headaches.  She does not feel as though the Robaxin is helping much with her muscle tightness.  I have switched this to tizanidine for patient to trial.  The risk and benefits of this medication were discussed at length.  She was advised to make sure she does not take this with other muscle relaxants.  Unfortunately, she never started physical therapy.  She states that they did not contact her.  A new referral was sent.  I advised patient that if her physical therapy is not set up in the next several days to please contact me so I can help facilitate further.  Difficult to say if patient has cervical dystonia.  What is the main cause of her symptoms.  She is also had relief from several different types of injections which also makes it harder to discern. Plan for flexion and extension films of her cervical spine today for further evaluation of listhesis.  Advised patient to see Dr. Felipe Horton in clinic here shortly to evaluate surgical options.  I will help coordinate this as well.  .  60 minutes was spent total time dedicated to the patient on the date of the face-to-face encounter including non-face-to-face time in face-to-face time.  This included preparing to see the patient, obtaining and reviewing  history, performing her examination, counseling the patient, ordering additional tests, documenting clinical formation, independently interpreting results, care coordination.   Thank you for involving me in the care of this patient.   Ludwig Safer, PA-C Dept. of Neurosurgery

## 2023-06-11 ENCOUNTER — Encounter (INDEPENDENT_AMBULATORY_CARE_PROVIDER_SITE_OTHER): Payer: Self-pay

## 2023-06-21 ENCOUNTER — Ambulatory Visit (INDEPENDENT_AMBULATORY_CARE_PROVIDER_SITE_OTHER): Admitting: Nurse Practitioner

## 2023-06-21 ENCOUNTER — Encounter (INDEPENDENT_AMBULATORY_CARE_PROVIDER_SITE_OTHER)

## 2023-06-24 ENCOUNTER — Ambulatory Visit: Admitting: Neurosurgery

## 2023-07-08 ENCOUNTER — Emergency Department

## 2023-07-08 ENCOUNTER — Other Ambulatory Visit: Payer: Self-pay

## 2023-07-08 ENCOUNTER — Observation Stay
Admission: EM | Admit: 2023-07-08 | Discharge: 2023-07-10 | Disposition: A | Discharge status: Home / Self Care | Attending: Obstetrics and Gynecology | Admitting: Obstetrics and Gynecology

## 2023-07-08 DIAGNOSIS — I350 Nonrheumatic aortic (valve) stenosis: Secondary | ICD-10-CM | POA: Diagnosis not present

## 2023-07-08 DIAGNOSIS — E785 Hyperlipidemia, unspecified: Secondary | ICD-10-CM | POA: Insufficient documentation

## 2023-07-08 DIAGNOSIS — Z79899 Other long term (current) drug therapy: Secondary | ICD-10-CM | POA: Insufficient documentation

## 2023-07-08 DIAGNOSIS — I10 Essential (primary) hypertension: Secondary | ICD-10-CM | POA: Diagnosis present

## 2023-07-08 DIAGNOSIS — I130 Hypertensive heart and chronic kidney disease with heart failure and stage 1 through stage 4 chronic kidney disease, or unspecified chronic kidney disease: Secondary | ICD-10-CM | POA: Diagnosis not present

## 2023-07-08 DIAGNOSIS — Z96653 Presence of artificial knee joint, bilateral: Secondary | ICD-10-CM | POA: Diagnosis not present

## 2023-07-08 DIAGNOSIS — E039 Hypothyroidism, unspecified: Secondary | ICD-10-CM | POA: Insufficient documentation

## 2023-07-08 DIAGNOSIS — R197 Diarrhea, unspecified: Secondary | ICD-10-CM | POA: Insufficient documentation

## 2023-07-08 DIAGNOSIS — R0789 Other chest pain: Principal | ICD-10-CM

## 2023-07-08 DIAGNOSIS — Z91040 Latex allergy status: Secondary | ICD-10-CM | POA: Insufficient documentation

## 2023-07-08 DIAGNOSIS — E669 Obesity, unspecified: Secondary | ICD-10-CM | POA: Insufficient documentation

## 2023-07-08 DIAGNOSIS — Z6839 Body mass index (BMI) 39.0-39.9, adult: Secondary | ICD-10-CM | POA: Insufficient documentation

## 2023-07-08 DIAGNOSIS — I5032 Chronic diastolic (congestive) heart failure: Secondary | ICD-10-CM | POA: Insufficient documentation

## 2023-07-08 DIAGNOSIS — Z7982 Long term (current) use of aspirin: Secondary | ICD-10-CM | POA: Diagnosis not present

## 2023-07-08 DIAGNOSIS — I890 Lymphedema, not elsewhere classified: Secondary | ICD-10-CM | POA: Insufficient documentation

## 2023-07-08 DIAGNOSIS — R079 Chest pain, unspecified: Secondary | ICD-10-CM | POA: Diagnosis not present

## 2023-07-08 DIAGNOSIS — N1831 Chronic kidney disease, stage 3a: Secondary | ICD-10-CM | POA: Diagnosis not present

## 2023-07-08 DIAGNOSIS — R531 Weakness: Secondary | ICD-10-CM

## 2023-07-08 LAB — CBC
HCT: 44 % (ref 36.0–46.0)
Hemoglobin: 14.4 g/dL (ref 12.0–15.0)
MCH: 29.2 pg (ref 26.0–34.0)
MCHC: 32.7 g/dL (ref 30.0–36.0)
MCV: 89.2 fL (ref 80.0–100.0)
Platelets: 181 10*3/uL (ref 150–400)
RBC: 4.93 MIL/uL (ref 3.87–5.11)
RDW: 13 % (ref 11.5–15.5)
WBC: 5.5 10*3/uL (ref 4.0–10.5)
nRBC: 0 % (ref 0.0–0.2)

## 2023-07-08 LAB — BASIC METABOLIC PANEL WITH GFR
Anion gap: 10 (ref 5–15)
BUN: 17 mg/dL (ref 8–23)
CO2: 23 mmol/L (ref 22–32)
Calcium: 9.2 mg/dL (ref 8.9–10.3)
Chloride: 107 mmol/L (ref 98–111)
Creatinine, Ser: 1.13 mg/dL — ABNORMAL HIGH (ref 0.44–1.00)
GFR, Estimated: 50 mL/min — ABNORMAL LOW (ref >60)
Glucose, Bld: 145 mg/dL — ABNORMAL HIGH (ref 70–99)
Potassium: 3.6 mmol/L (ref 3.5–5.1)
Sodium: 140 mmol/L (ref 135–145)

## 2023-07-08 LAB — LIPID PANEL
Cholesterol: 136 mg/dL (ref 0–200)
HDL: 52 mg/dL (ref >40)
LDL Cholesterol: 62 mg/dL (ref 0–99)
Total CHOL/HDL Ratio: 2.6 ratio
Triglycerides: 110 mg/dL (ref <150)
VLDL: 22 mg/dL (ref 0–40)

## 2023-07-08 LAB — BRAIN NATRIURETIC PEPTIDE: B Natriuretic Peptide: 118.9 pg/mL — ABNORMAL HIGH (ref 0.0–100.0)

## 2023-07-08 LAB — TROPONIN I (HIGH SENSITIVITY)
Troponin I (High Sensitivity): 26 ng/L — ABNORMAL HIGH (ref <18)
Troponin I (High Sensitivity): 32 ng/L — ABNORMAL HIGH (ref <18)

## 2023-07-08 MED ORDER — HYDRALAZINE HCL 20 MG/ML IJ SOLN: 5.0000 mg | INTRAMUSCULAR | Status: DC | PRN

## 2023-07-08 MED ORDER — ONDANSETRON HCL 4 MG/2ML IJ SOLN: 4.0000 mg | Freq: Three times a day (TID) | INTRAMUSCULAR | Status: DC | PRN

## 2023-07-08 MED ORDER — ACETAMINOPHEN 325 MG PO TABS: 650.0000 mg | ORAL_TABLET | Freq: Four times a day (QID) | ORAL | Status: DC | PRN

## 2023-07-08 MED ORDER — HEPARIN SODIUM (PORCINE) 5000 UNIT/ML IJ SOLN
5000.0000 [IU] | Freq: Three times a day (TID) | INTRAMUSCULAR | Status: DC
  Administered 2023-07-09 – 2023-07-10 (×5): 5000 [IU] via SUBCUTANEOUS
  Filled 2023-07-08 (×5): qty 1

## 2023-07-08 MED ORDER — NITROGLYCERIN 0.4 MG SL SUBL: 0.4000 mg | SUBLINGUAL_TABLET | SUBLINGUAL | Status: DC | PRN

## 2023-07-08 NOTE — H&P (Signed)
 History and Physical    Brianna Dalton:[id number] DOB: [birth year] DOA: 07/08/2023  Referring MD/NP/PA:   PCP: Yehuda Helms, MD   Patient coming from:  The patient is coming from home.     Chief Complaint: Chest pain  HPI: Brianna Dalton is a 78 y.o. female with medical history significant of dCHF, HTN, HLD, hypothyroidism, gout, CKD-3, bilateral carotid artery stenosis, lymphedema, varicose vein, neuropathy, obesity, aortic stenosis, who presents with chest pain.  Patient states she has chest pain and SOB for several days, associated with dizziness and lightheadedness.  Her chest pain is located substernal area, pressure-like, moderate, nonradiating, aggravated by exertion.  She has dry cough, no fever or chills.  Patient has generalized weakness for several weeks.  Patient has diarrhea in the past several days, with 3 watery diarrhea today.  She has nausea, no vomiting or abdominal pain.  No fever or chills.  No symptoms of UTI.  No fall or head injury.  No recent long distance traveling.  Data reviewed independently and ED Course: pt was found to have  trop  26 --> 32 --> 30, BNP  118, WBC 5.5, renal function close to baseline, temperature normal, blood pressure 162/80, heart rate 100 --> 80, heart rate 21, oxygen saturation 100% on room air.  Chest x-ray negative.  Patient is placed in telemetry bed for observation.  Dr. Beau Bound of cardiology is consulted.   EKG: I have personally reviewed.  Sinus rhythm, QTc 441, low voltage, early R wave progression.   Review of Systems:   General: no fevers, chills, no body weight gain,  has fatigue HEENT: no blurry vision, hearing changes or sore throat Respiratory: has dyspnea, coughing, no wheezing CV: has chest pain, no palpitations GI: has nausea, no vomiting, abdominal pain, has diarrhea, no constipation GU: no dysuria, burning on urination, increased urinary frequency, hematuria  Ext: no leg edema Neuro: no unilateral  weakness, numbness, or tingling, no vision change or hearing loss Skin: no rash, no skin tear. MSK: No muscle spasm, no deformity, no limitation of range of movement in spin Heme: No easy bruising.  Travel history: No recent long distant travel.   Allergy:  Allergies  Allergen Reactions   Oxycodone -Acetaminophen  Hives, Rash, Shortness Of Breath and Swelling   Strawberry Flavoring Agent (Non-Screening) Shortness Of Breath and Rash   Aloe Vera Hives   Amlodipine    Hydrocodone Hives   Latex     NEGATIVE BY IgE (<0.10)   Morphine  And Codeine Other (See Comments)    Nausea delusion    Penicillins Hives    Has patient had a PCN reaction causing immediate rash, facial/tongue/throat swelling, SOB or lightheadedness with hypotension: Yes Has patient had a PCN reaction causing severe rash involving mucus membranes or skin necrosis: No Has patient had a PCN reaction that required hospitalization: No Has patient had a PCN reaction occurring within the last 10 years: No If all of the above answers are NO, then may proceed with Cephalosporin use.   Meperidine Rash    Past Medical History:  Diagnosis Date   Angina pectoris syndrome (HCC)    Arterial hemorrhage    Lt ankle possible venous bleed   Arthritis    Hypertension    Hypothyroidism    Neuropathy    Varicose vein of leg     Past Surgical History:  Procedure Laterality Date   ABDOMINAL HYSTERECTOMY     BREAST BIOPSY Right 01/19/2013   stereo biopsy of two areas  neg   CHOLECYSTECTOMY N/A 03/28/2019   Procedure: LAPAROSCOPIC CHOLECYSTECTOMY;  Surgeon: Emmalene Hare, MD;  Location: [facility];  Service: General;  Laterality: N/A;   JOINT REPLACEMENT     lt knee   KNEE ARTHROPLASTY Right 04/27/2015   Procedure: COMPUTER ASSISTED TOTAL KNEE ARTHROPLASTY;  Surgeon: Arlyne Lame, MD;  Location: [facility];  Service: Orthopedics;  Laterality: Right;   LEFT HEART CATH AND CORONARY ANGIOGRAPHY N/A 04/10/2017   Procedure: LEFT HEART CATH  AND CORONARY ANGIOGRAPHY;  Surgeon: Ronney Cola, MD;  Location: [facility];  Service: Cardiovascular;  Laterality: N/A;   SHOULDER ARTHROSCOPY Left    THYROID  SURGERY Bilateral    TUMOR REMOVAL Right    neck    Social History:  reports that she has never smoked. She has never used smokeless tobacco. She reports that she does not drink alcohol and does not use drugs.  Family History:  Family History  Problem Relation Age of Onset   Breast cancer Sister      Prior to Admission medications   Medication Sig Start Date End Date Taking? Authorizing Provider  allopurinol  (ZYLOPRIM ) 300 MG tablet Take 300 mg by mouth daily. 03/11/17   [provider]  aspirin  EC 81 MG tablet Take 81 mg by mouth daily.    [provider]  colchicine  0.6 MG tablet Take 0.6 mg by mouth as needed.     [provider]  ergocalciferol (VITAMIN D2) 1.25 MG (50000 UT) capsule Take by mouth. 12/26/20   [provider]  gabapentin (NEURONTIN) 300 MG capsule Take 300 mg by mouth 2 (two) times daily. 09/05/21   [provider]  isosorbide mononitrate (IMDUR) 30 MG 24 hr tablet Take 30 mg by mouth daily.    [provider]  levothyroxine  (SYNTHROID ) 125 MCG tablet Take 125 mcg by mouth daily before breakfast.     [provider]  losartan  (COZAAR ) 50 MG tablet Hold until followup with outpatient doctor since cardiologist started you on Aldactone . 04/12/20   Garrison Kanner, MD  metoprolol  tartrate (LOPRESSOR ) 25 MG tablet Take 12.5 mg by mouth 2 (two) times daily.    [provider]  tiZANidine  (ZANAFLEX ) 4 MG tablet Take 1 tablet (4 mg total) by mouth every 8 (eight) hours as needed for muscle spasms. 06/03/23 06/02/24  Ludwig Safer, PA-C  torsemide  (DEMADEX ) 10 MG tablet Take 10 mg by mouth daily as needed.    [provider]  traMADol  (ULTRAM ) 50 MG tablet Take 1-2 tablets (50-100 mg total) by mouth every 4 (four) hours as needed for  moderate pain. 04/28/15   Arnulfo Bidding, PA    Physical Exam: Vitals:   07/08/23 1447 07/08/23 1708 07/08/23 1851 07/08/23 2148  BP: 106/82 99/73 104/78 (!) 162/80  Pulse: 100 85 78 80  Resp: 18 17 18  (!) 21  Temp: 98 F (36.7 C) 97.7 F (36.5 C) 97.9 F (36.6 C)   TempSrc:  Oral Oral   SpO2: 100% 99% 97% 100%  Weight: 122.5 kg     Height: 5' 10 (1.778 m)      General: Not in acute distress HEENT:       Eyes: PERRL, EOMI, no jaundice       ENT: No discharge from the ears and nose, no pharynx injection, no tonsillar enlargement.        Neck: No JVD, no bruit, no mass felt. Heme: No neck lymph node enlargement. Cardiac: S1/S2, RRR, 4/5 systolic murmurs,  No gallops or rubs. Respiratory: No rales, wheezing, rhonchi or rubs. GI: Soft, nondistended, nontender, no rebound pain, no organomegaly, BS present. GU: No hematuria Ext: No pitting leg edema bilaterally. 1+DP/PT pulse bilaterally. Musculoskeletal: No joint deformities, No joint redness or warmth, no limitation of ROM in spin. Skin: No rashes.  Neuro: Alert, oriented X3, cranial nerves II-XII grossly intact, moves all extremities normally.  Psych: Patient is not psychotic, no suicidal or hemocidal ideation.  Labs on Admission: I have personally reviewed following labs and imaging studies  CBC: Recent Labs  Lab 07/08/23 1448  WBC 5.5  HGB 14.4  HCT 44.0  MCV 89.2  PLT 181   Basic Metabolic Panel: Recent Labs  Lab 07/08/23 1448  NA 140  K 3.6  CL 107  CO2 23  GLUCOSE 145*  BUN 17  CREATININE 1.13*  CALCIUM  9.2   GFR: Estimated Creatinine Clearance: 59.3 mL/min (A) (by C-G formula based on SCr of 1.13 mg/dL (H)). Liver Function Tests: No results for input(s): AST, ALT, ALKPHOS, BILITOT, PROT, ALBUMIN in the last 168 hours. No results for input(s): LIPASE, AMYLASE in the last 168 hours. No results for input(s): AMMONIA in the last 168 hours. Coagulation Profile: Recent Labs  Lab  07/08/23 2345  INR 1.1   Cardiac Enzymes: No results for input(s): CKTOTAL, CKMB, CKMBINDEX, TROPONINI in the last 168 hours. BNP (last 3 results) No results for input(s): PROBNP in the last 8760 hours. HbA1C: No results for input(s): HGBA1C in the last 72 hours. CBG: No results for input(s): GLUCAP in the last 168 hours. Lipid Profile: Recent Labs    07/08/23 2118  CHOL 136  HDL 52  LDLCALC 62  TRIG 110  CHOLHDL 2.6   Thyroid  Function Tests: No results for input(s): TSH, T4TOTAL, FREET4, T3FREE, THYROIDAB in the last 72 hours. Anemia Panel: No results for input(s): VITAMINB12, FOLATE, FERRITIN, TIBC, IRON, RETICCTPCT in the last 72 hours. Urine analysis:    Component Value Date/Time   COLORURINE AMBER (A) 06/17/2018 1552   APPEARANCEUR CLOUDY (A) 06/17/2018 1552   APPEARANCEUR Clear 07/02/2013 1409   LABSPEC 1.030 06/17/2018 1552   LABSPEC 1.011 07/02/2013 1409   PHURINE 5.0 06/17/2018 1552   GLUCOSEU NEGATIVE 06/17/2018 1552   GLUCOSEU Negative 07/02/2013 1409   HGBUR NEGATIVE 06/17/2018 1552   BILIRUBINUR SMALL (A) 06/17/2018 1552   BILIRUBINUR Negative 07/02/2013 1409   KETONESUR NEGATIVE 06/17/2018 1552   PROTEINUR >=300 (A) 06/17/2018 1552   NITRITE NEGATIVE 06/17/2018 1552   LEUKOCYTESUR NEGATIVE 06/17/2018 1552   LEUKOCYTESUR Negative 07/02/2013 1409   Sepsis Labs: @LABRCNTIP (procalcitonin:4,lacticidven:4) )No results found for this or any previous visit (from the past 240 hours).   Radiological Exams on Admission:   Assessment/Plan Principal Problem:   Chest pain Active Problems:   HLD (hyperlipidemia)   Essential hypertension   Chronic diastolic CHF (congestive heart failure) (HCC)   Chronic kidney disease, stage 3a (HCC)   Adult hypothyroidism   Moderate aortic stenosis   Diarrhea   Obesity (BMI 30-39.9)   Assessment and Plan:  Chest pain: Troponin is mildly elevated 26 --> 32 --> 30.  Likely due to  demand ischemia secondary to aortic stenosis.  Patient reports lightheadedness dizziness, which is likely due to aortic stenosis.  Low suspicions for PE.  Consulted Dr. Beau Bound of cardiology.  - place to tele bed for observation - Trend Trop - prn tramadol  for pain - prn Nitroglycerin  -Continue Imdur and metoprolol  - Continue ASA 81 mg daily - Statin: Start  Lipitor 40 mg daily - Risk factor stratification: will check FLP and A1C  - 2d echo  HLD (hyperlipidemia) -Start Lipitor 40 mg daily today  Essential hypertension -IV hydralazine  as needed  Chronic diastolic CHF (congestive heart failure) (HCC): 2D echo on 07/27/2022 showed EF> 55%.  BNP 118.  Patient does not have leg edema.  Does not seem to have CHF exacerbation. -Hold torsemide  to avoid overdiuresis in the setting of aortic stenosis and diarrhea.  Chronic kidney disease, stage 3a (HCC): Renal function close to baseline.  Recent baseline continue 1.0 02/15/2023.  Her creatinine 7.13, GFR 50, BUN 17. -Follow up with BMP  Adult hypothyroidism -Synthroid   Moderate aortic stenosis: Patient has  4 out of 5 systolic murmur, reporting lightheadedness. - Follow-up 2D echo for further evaluation  Diarrhea -Check C. difficile and GI pathogen panel  Obesity (BMI 30-39.9): Patient has Obesity Class II, with body weight 122.5 Kg and BMI 38.74 kg/m2.  - Encourage losing weight - Exercise and healthy diet      DVT ppx: SQ Heparin     Code Status: Full code    Family Communication:   Yes, patient's brother   at bed side.    Disposition Plan:  Anticipate discharge back to previous environment  Consults called:  Dr. Beau Bound of cardiology is consulted.  Admission status and Level of care: Telemetry Cardiac:    for obs     Dispo: The patient is from: Home              Anticipated d/c is to: Home              Anticipated d/c date is: 1 day              Patient currently is not medically stable to d/c.    Severity of  Illness:  The appropriate patient status for this patient is OBSERVATION. Observation status is judged to be reasonable and necessary in order to provide the required intensity of service to ensure the patient's safety. The patient's presenting symptoms, physical exam findings, and initial radiographic and laboratory data in the context of their medical condition is felt to place them at decreased risk for further clinical deterioration. Furthermore, it is anticipated that the patient will be medically stable for discharge from the hospital within 2 midnights of admission.        Date of Service 07/09/2023    Fidencio Hue [facility]   If 7PM-7AM, please contact night-coverage [email or web address] 07/09/2023, 12:58 AM

## 2023-07-08 NOTE — ED Provider Notes (Signed)
 [facility] Provider Note    Event Date/Time   First MD Initiated Contact with Patient 07/08/23 2146     (approximate)   History   Chest Pain   HPI  Brianna Dalton is a 78 y.o. female presents to the emergency department today because of concerns for chest tightness, weakness.  Patient states that she is been feeling bad over the past couple of weeks.  She does have a history of heart murmur and states that her cardiologist feels like it is getting worse.  She has noticed decreased exercise tolerance.  Today she had worsening chest tightness in the center of her chest.  Started this morning.  She denies Brianna fevers.     Physical Exam   Triage Vital Signs: ED Triage Vitals  Encounter Vitals Group     BP 07/08/23 1447 106/82     Girls Systolic BP Percentile --      Girls Diastolic BP Percentile --      Boys Systolic BP Percentile --      Boys Diastolic BP Percentile --      Pulse Rate 07/08/23 1447 100     Resp 07/08/23 1447 18     Temp 07/08/23 1447 98 F (36.7 C)     Temp Source 07/08/23 1708 Oral     SpO2 07/08/23 1447 100 %     Weight 07/08/23 1447 270 lb (122.5 kg)     Height 07/08/23 1447 5' 10 (1.778 m)     Head Circumference --      Peak Flow --      Pain Score 07/08/23 1447 7     Pain Loc --      Pain Education --      Exclude from Growth Chart --     Most recent vital signs: Vitals:   07/08/23 1708 07/08/23 1851  BP: 99/73 104/78  Pulse: 85 78  Resp: 17 18  Temp: 97.7 F (36.5 C) 97.9 F (36.6 C)  SpO2: 99% 97%   General: Awake, alert, oriented. CV:  Good peripheral perfusion. Systolic murmur. Regular rate and rhythm. Resp:  Normal effort. Lungs clear. Abd:  No distention.  Other:  No lower extremity edema.   ED Results / Procedures / Treatments   Labs (all labs ordered are listed, but only abnormal results are displayed) Labs Reviewed  BASIC METABOLIC PANEL WITH GFR - Abnormal; Notable for the following  components:      Result Value   Glucose, Bld 145 (*)    Creatinine, Ser 1.13 (*)    GFR, Estimated 50 (*)    All other components within normal limits  BRAIN NATRIURETIC PEPTIDE - Abnormal; Notable for the following components:   B Natriuretic Peptide 118.9 (*)    All other components within normal limits  TROPONIN I (HIGH SENSITIVITY) - Abnormal; Notable for the following components:   Troponin I (High Sensitivity) 26 (*)    All other components within normal limits  CBC  TROPONIN I (HIGH SENSITIVITY)     EKG  I, Marylynn Soho, attending physician, personally viewed and interpreted this EKG  EKG Time: 1449 Rate: 98 Rhythm: normal sinus rhythm Axis: normal Intervals: qtc 441 QRS: narrow, q waves III, v1 ST changes: no st elevation Impression: abnormal ekg    RADIOLOGY I independently interpreted and visualized the CXR. My interpretation: No pneumonia Radiology interpretation:  IMPRESSION:  1. Stable chest, no acute process.     PROCEDURES:  Critical Care performed:  No    MEDICATIONS ORDERED IN ED: Medications - No data to display   IMPRESSION / MDM / ASSESSMENT AND PLAN / ED COURSE  I reviewed the triage vital signs and the nursing notes.                              Differential diagnosis includes, but is not limited to, ACS, anemia, electrolyte abnormality  Patient's presentation is most consistent with acute presentation with potential threat to life or bodily function.   The patient is on the cardiac monitor to evaluate for evidence of arrhythmia and/or significant heart rate changes.  Patient presented to the emergency department today because of concerns for chest tightness and weakness.  On exam patient is awake and alert.  Blood work showed slight initial troponin elevation.  EKG however without Brianna ST elevation.  Repeat troponin showed worsening levels.  Discussed with Dr. Rosalea Collin with the hospitalist service who will evaluate for admission.       FINAL CLINICAL IMPRESSION(S) / ED DIAGNOSES   Final diagnoses:  Chest tightness  Weakness       Note:  This document was prepared using Dragon voice recognition software and may include unintentional dictation errors.    Marylynn Soho, MD 07/08/23 [phone]

## 2023-07-08 NOTE — ED Notes (Signed)
 Congested... difficulty breathing X7 days.... Dizziness X1 month

## 2023-07-08 NOTE — ED Notes (Signed)
 Lab called for repeat troponin.

## 2023-07-08 NOTE — ED Triage Notes (Signed)
 Pt comes with cp that started this morning the patient mid sternal pain and tender. Pt states soreness and tightness. Pt  states hx of heart murmur. Pt not on thinners. Pt states sob.

## 2023-07-09 ENCOUNTER — Observation Stay
Admit: 2023-07-09 | Discharge: 2023-07-09 | Disposition: A | Discharge status: Home / Self Care | Attending: Internal Medicine | Admitting: Internal Medicine

## 2023-07-09 DIAGNOSIS — R197 Diarrhea, unspecified: Secondary | ICD-10-CM | POA: Diagnosis present

## 2023-07-09 DIAGNOSIS — R079 Chest pain, unspecified: Secondary | ICD-10-CM | POA: Diagnosis not present

## 2023-07-09 LAB — GASTROINTESTINAL PANEL BY PCR, STOOL (REPLACES STOOL CULTURE)

## 2023-07-09 LAB — ECHOCARDIOGRAM COMPLETE
AR max vel: 0.77 cm2
AV Area VTI: 0.83 cm2
AV Area mean vel: 0.71 cm2
AV Mean grad: 28.7 mmHg
AV Peak grad: 50.2 mmHg
Ao pk vel: 3.54 m/s
Area-P 1/2: 2.08 cm2
Height: 70 in
S' Lateral: 2.9 cm
Weight: 4320 [oz_av]

## 2023-07-09 LAB — TROPONIN I (HIGH SENSITIVITY): Troponin I (High Sensitivity): 30 ng/L — ABNORMAL HIGH (ref <18)

## 2023-07-09 LAB — CBC
HCT: 39.8 % (ref 36.0–46.0)
Hemoglobin: 13 g/dL (ref 12.0–15.0)
MCH: 29.1 pg (ref 26.0–34.0)
MCHC: 32.7 g/dL (ref 30.0–36.0)
MCV: 89 fL (ref 80.0–100.0)
Platelets: 190 10*3/uL (ref 150–400)
RBC: 4.47 MIL/uL (ref 3.87–5.11)
RDW: 13.1 % (ref 11.5–15.5)
WBC: 5.7 10*3/uL (ref 4.0–10.5)
nRBC: 0 % (ref 0.0–0.2)

## 2023-07-09 LAB — BASIC METABOLIC PANEL WITH GFR
Anion gap: 6 (ref 5–15)
BUN: 20 mg/dL (ref 8–23)
CO2: 29 mmol/L (ref 22–32)
Calcium: 8.8 mg/dL — ABNORMAL LOW (ref 8.9–10.3)
Chloride: 106 mmol/L (ref 98–111)
Creatinine, Ser: 0.98 mg/dL (ref 0.44–1.00)
GFR, Estimated: 59 mL/min — ABNORMAL LOW (ref >60)
Glucose, Bld: 101 mg/dL — ABNORMAL HIGH (ref 70–99)
Potassium: 4.3 mmol/L (ref 3.5–5.1)
Sodium: 141 mmol/L (ref 135–145)

## 2023-07-09 LAB — C DIFFICILE QUICK SCREEN W PCR REFLEX
C Diff antigen: NEGATIVE
C Diff interpretation: NOT DETECTED
C Diff toxin: NEGATIVE

## 2023-07-09 LAB — APTT: aPTT: 27 s (ref 24–36)

## 2023-07-09 LAB — HEMOGLOBIN A1C
Hgb A1c MFr Bld: 5.8 % — ABNORMAL HIGH (ref 4.8–5.6)
Mean Plasma Glucose: 119.76 mg/dL

## 2023-07-09 LAB — PROTIME-INR
INR: 1.1 (ref 0.8–1.2)
Prothrombin Time: 14 s (ref 11.4–15.2)

## 2023-07-09 MED ORDER — METOPROLOL TARTRATE 25 MG PO TABS
12.5000 mg | ORAL_TABLET | Freq: Two times a day (BID) | ORAL | Status: DC
  Administered 2023-07-09 – 2023-07-10 (×4): 12.5 mg via ORAL
  Filled 2023-07-09 (×4): qty 1

## 2023-07-09 MED ORDER — PERFLUTREN LIPID MICROSPHERE
1.0000 mL | INTRAVENOUS | Status: AC | PRN
  Administered 2023-07-09: 4 mL via INTRAVENOUS

## 2023-07-09 MED ORDER — BUTALBITAL-APAP-CAFFEINE 50-325-40 MG PO TABS
1.0000 | ORAL_TABLET | ORAL | Status: DC | PRN
  Administered 2023-07-10: 1 via ORAL
  Filled 2023-07-09: qty 1

## 2023-07-09 MED ORDER — TRAMADOL HCL 50 MG PO TABS: 50.0000 mg | ORAL_TABLET | ORAL | Status: DC | PRN

## 2023-07-09 MED ORDER — PSYLLIUM 95 % PO PACK
1.0000 | PACK | Freq: Every day | ORAL | Status: DC
  Filled 2023-07-09 (×3): qty 1

## 2023-07-09 MED ORDER — ASPIRIN 81 MG PO TBEC
81.0000 mg | DELAYED_RELEASE_TABLET | Freq: Every day | ORAL | Status: DC
  Administered 2023-07-09 – 2023-07-10 (×2): 81 mg via ORAL
  Filled 2023-07-09 (×2): qty 1

## 2023-07-09 MED ORDER — LEVOTHYROXINE SODIUM 25 MCG PO TABS
125.0000 ug | ORAL_TABLET | Freq: Every day | ORAL | Status: DC
  Administered 2023-07-09 – 2023-07-10 (×2): 125 ug via ORAL
  Filled 2023-07-09: qty 1
  Filled 2023-07-09: qty 5

## 2023-07-09 MED ORDER — ISOSORBIDE MONONITRATE ER 30 MG PO TB24
30.0000 mg | ORAL_TABLET | Freq: Every day | ORAL | Status: DC
  Administered 2023-07-09 – 2023-07-10 (×2): 30 mg via ORAL
  Filled 2023-07-09 (×2): qty 1

## 2023-07-09 MED ORDER — LOPERAMIDE HCL 2 MG PO CAPS: 2.0000 mg | ORAL_CAPSULE | ORAL | Status: DC | PRN

## 2023-07-09 MED ORDER — TORSEMIDE 20 MG PO TABS: 10.0000 mg | ORAL_TABLET | Freq: Every day | ORAL | Status: DC

## 2023-07-09 MED ORDER — LOSARTAN POTASSIUM 50 MG PO TABS
50.0000 mg | ORAL_TABLET | Freq: Every day | ORAL | Status: DC
  Administered 2023-07-09 – 2023-07-10 (×2): 50 mg via ORAL
  Filled 2023-07-09 (×2): qty 1

## 2023-07-09 MED ORDER — ATORVASTATIN CALCIUM 20 MG PO TABS
40.0000 mg | ORAL_TABLET | Freq: Every day | ORAL | Status: DC
  Administered 2023-07-09 – 2023-07-10 (×2): 40 mg via ORAL
  Filled 2023-07-09 (×2): qty 2

## 2023-07-09 MED ORDER — GABAPENTIN 300 MG PO CAPS
300.0000 mg | ORAL_CAPSULE | Freq: Two times a day (BID) | ORAL | Status: DC
  Administered 2023-07-09 – 2023-07-10 (×4): 300 mg via ORAL
  Filled 2023-07-09 (×4): qty 1

## 2023-07-09 MED ORDER — MECLIZINE HCL 25 MG PO TABS: 25.0000 mg | ORAL_TABLET | Freq: Three times a day (TID) | ORAL | Status: DC | PRN

## 2023-07-09 MED ORDER — TIZANIDINE HCL 4 MG PO TABS: 4.0000 mg | ORAL_TABLET | Freq: Three times a day (TID) | ORAL | Status: DC | PRN

## 2023-07-09 NOTE — Progress Notes (Signed)
 Echocardiogram 2D Echocardiogram has been performed.  Brianna Dalton 07/09/2023, 12:56 PM

## 2023-07-09 NOTE — Progress Notes (Signed)
 PROGRESS NOTE    Brianna Dalton  ZOX:[id number] DOB: Dec 10, 1945 DOA: 07/08/2023 PCP: Yehuda Helms, MD    Brief Narrative:  78 y.o. female with medical history significant of dCHF, HTN, HLD, hypothyroidism, gout, CKD-3, bilateral carotid artery stenosis, lymphedema, varicose vein, neuropathy, obesity, aortic stenosis, who presents with chest pain.   Patient states she has chest pain and SOB for several days, associated with dizziness and lightheadedness.  Her chest pain is located substernal area, pressure-like, moderate, nonradiating, aggravated by exertion.  She has dry cough, no fever or chills.  Patient has generalized weakness for several weeks.  Patient has diarrhea in the past several days, with 3 watery diarrhea today.  She has nausea, no vomiting or abdominal pain.  No fever or chills.  No symptoms of UTI.  No fall or head injury.  No recent long distance traveling.   Assessment & Plan:   Principal Problem:   Chest pain Active Problems:   HLD (hyperlipidemia)   Essential hypertension   Chronic diastolic CHF (congestive heart failure) (HCC)   Chronic kidney disease, stage 3a (HCC)   Adult hypothyroidism   Moderate aortic stenosis   Diarrhea   Obesity (BMI 30-39.9)   Chest pain Dizziness Decreased exercise tolerance Troponin minimal elevation.  Suspect demand ischemia in the setting of aortic stenosis.  2D echocardiogram ordered to evaluate severity of valvulopathy. Plan: Continue Imdur and metoprolol  Continue aspirin  Continue statin Check 2D echocardiogram Estell Heller following  HLD (hyperlipidemia) Lipitor 40   Essential hypertension Imdur Toprol  all as above   Chronic diastolic CHF (congestive heart failure) (HCC): 2D echo on 07/27/2022 showed EF> 55%.  BNP 118.  Patient does not have leg edema.   - Hold torsemide  for now pending echocardiogram.  Does not appear to be in exacerbation   Chronic kidney disease, stage 3a (HCC):  Renal function close to  baseline.  Recent baseline continue 1.0 02/15/2023.  Her creatinine 7.13, GFR 50, BUN 17.   Adult hypothyroidism Continue Synthroid    Moderate aortic stenosis: Patient has  4 out of 5 systolic murmur, reporting lightheadedness. Follow-up 2D echocardiogram   Diarrhea Chronic.  Noninfectious.  C. difficile and GI PCR negative.  Discontinue precautions.  Start daily psyllium.  As needed Imodium.   Obesity (BMI 30-39.9):  Patient has Obesity Class II, with body weight 122.5 Kg and BMI 38.74 kg/m2.  Complicating factor overall care and prognosis     DVT prophylaxis: SQ heparin  Code Status: Full Family Communication: None today Disposition Plan: Status is: Observation The patient will require care spanning > 2 midnights and should be moved to inpatient because: Chest pain, dizziness, aortic stenosis   Level of care: Telemetry Cardiac  Consultants:  Cardiology-[facility]  Procedures:  None  Antimicrobials: None   Subjective: Seen and examined.  Seen in bed.  Main complaint is dizziness and headache.  Objective: Vitals:   07/09/23 0930 07/09/23 1009 07/09/23 1115 07/09/23 1413  BP: 127/85   (!) 95/55  Pulse: 66  70 62  Resp: 16  19 15   Temp:  97.9 F (36.6 C)    TempSrc:  Oral    SpO2: 97%  98% 96%  Weight:      Height:       No intake or output data in the 24 hours ending 07/09/23 1542 Filed Weights   07/08/23 1447  Weight: 122.5 kg    Examination:  General exam: Appears calm and comfortable  Respiratory system: Clear to auscultation. Respiratory effort normal. Cardiovascular  system: S1-S2 RRR, 2/6 murmur, no pedal edema Gastrointestinal system: Soft, NT/ND, normal bowel sounds Central nervous system: Alert and oriented. No focal neurological deficits. Extremities: Symmetric 5 x 5 power. Skin: No rashes, lesions or ulcers Psychiatry: Judgement and insight appear normal. Mood & affect appropriate.     Data Reviewed: I have personally reviewed  following labs and imaging studies  CBC: Recent Labs  Lab 07/08/23 1448 07/09/23 0500  WBC 5.5 5.7  HGB 14.4 13.0  HCT 44.0 39.8  MCV 89.2 89.0  PLT 181 190   Basic Metabolic Panel: Recent Labs  Lab 07/08/23 1448 07/09/23 0500  NA 140 141  K 3.6 4.3  CL 107 106  CO2 23 29  GLUCOSE 145* 101*  BUN 17 20  CREATININE 1.13* 0.98  CALCIUM  9.2 8.8*   GFR: Estimated Creatinine Clearance: 68.4 mL/min (by C-G formula based on SCr of 0.98 mg/dL). Liver Function Tests: No results for input(s): AST, ALT, ALKPHOS, BILITOT, PROT, ALBUMIN in the last 168 hours. No results for input(s): LIPASE, AMYLASE in the last 168 hours. No results for input(s): AMMONIA in the last 168 hours. Coagulation Profile: Recent Labs  Lab 07/08/23 2345  INR 1.1   Cardiac Enzymes: No results for input(s): CKTOTAL, CKMB, CKMBINDEX, TROPONINI in the last 168 hours. BNP (last 3 results) No results for input(s): PROBNP in the last 8760 hours. HbA1C: Recent Labs    07/08/23 2345  HGBA1C 5.8*   CBG: No results for input(s): GLUCAP in the last 168 hours. Lipid Profile: Recent Labs    07/08/23 2118  CHOL 136  HDL 52  LDLCALC 62  TRIG 110  CHOLHDL 2.6   Thyroid  Function Tests: No results for input(s): TSH, T4TOTAL, FREET4, T3FREE, THYROIDAB in the last 72 hours. Anemia Panel: No results for input(s): VITAMINB12, FOLATE, FERRITIN, TIBC, IRON, RETICCTPCT in the last 72 hours. Sepsis Labs: No results for input(s): PROCALCITON, LATICACIDVEN in the last 168 hours.  Recent Results (from the past 240 hours)  C Difficile Quick Screen w PCR reflex     Status: None   Collection Time: 07/09/23 10:03 AM   Specimen: STOOL  Result Value Ref Range Status   C Diff antigen NEGATIVE NEGATIVE Final   C Diff toxin NEGATIVE NEGATIVE Final   C Diff interpretation No C. difficile detected.  Final    Comment: Performed at [facility], [address] [place], [place] [address]  Gastrointestinal Panel by PCR , Stool     Status: None   Collection Time: 07/09/23 10:03 AM   Specimen: Stool  Result Value Ref Range Status   Campylobacter species NOT DETECTED NOT DETECTED Final   Plesimonas shigelloides NOT DETECTED NOT DETECTED Final   Salmonella species NOT DETECTED NOT DETECTED Final   Yersinia enterocolitica NOT DETECTED NOT DETECTED Final   Vibrio species NOT DETECTED NOT DETECTED Final   Vibrio cholerae NOT DETECTED NOT DETECTED Final   Enteroaggregative E coli (EAEC) NOT DETECTED NOT DETECTED Final   Enteropathogenic E coli (EPEC) NOT DETECTED NOT DETECTED Final   Enterotoxigenic E coli (ETEC) NOT DETECTED NOT DETECTED Final   Shiga like toxin producing E coli (STEC) NOT DETECTED NOT DETECTED Final   Shigella/Enteroinvasive E coli (EIEC) NOT DETECTED NOT DETECTED Final   Cryptosporidium NOT DETECTED NOT DETECTED Final   Cyclospora cayetanensis NOT DETECTED NOT DETECTED Final   Entamoeba histolytica NOT DETECTED NOT DETECTED Final   Giardia lamblia NOT DETECTED NOT DETECTED Final   Adenovirus F40/41 NOT DETECTED NOT  DETECTED Final   Astrovirus NOT DETECTED NOT DETECTED Final   Norovirus GI/GII NOT DETECTED NOT DETECTED Final   Rotavirus A NOT DETECTED NOT DETECTED Final   Sapovirus (I, II, IV, and V) NOT DETECTED NOT DETECTED Final    Comment: Performed at [facility], [address], [place], [place] [address]         Radiology Studies: DG Chest 2 View Result Date: 07/08/2023 CLINICAL DATA:  Midsternal chest pain, tenderness EXAM: CHEST - 2 VIEW COMPARISON:  11/15/2022 FINDINGS: Frontal and lateral views of the chest demonstrate a stable cardiac silhouette. No acute airspace disease, effusion, or pneumothorax. No acute bony abnormalities. IMPRESSION: 1. Stable chest, no acute process. Electronically Signed   By: Bobbye Burrow M.D.   On: 07/08/2023 15:41        Scheduled Meds:  aspirin  EC  81 mg Oral Daily    atorvastatin   40 mg Oral Daily   gabapentin  300 mg Oral BID   heparin   5,000 Units Subcutaneous Q8H   isosorbide mononitrate  30 mg Oral Daily   levothyroxine   125 mcg Oral Q0600   losartan   50 mg Oral Daily   metoprolol  tartrate  12.5 mg Oral BID   psyllium  1 packet Oral Daily   Continuous Infusions:   LOS: 0 days      Tiajuana Fluke, MD [facility]   If 7PM-7AM, please contact night-coverage  07/09/2023, 3:42 PM

## 2023-07-09 NOTE — Plan of Care (Signed)
°  Problem: Education: °Goal: Knowledge of General Education information will improve °Description: Including pain rating scale, medication(s)/side effects and non-pharmacologic comfort measures °Outcome: Progressing °  °Problem: Clinical Measurements: °Goal: Cardiovascular complication will be avoided °Outcome: Progressing °  °Problem: Activity: °Goal: Risk for activity intolerance will decrease °Outcome: Progressing °  °

## 2023-07-09 NOTE — Consult Note (Signed)
 [facility] CLINIC CARDIOLOGY CONSULT NOTE       Patient ID: Brianna Dalton MRN: [id number] DOB/AGE: 1945/02/23 78 y.o.  Admit date: 07/08/2023 Referring Physician Dr. Mosetta Areola Primary Physician Claudius Cumins, Rosalynn Come, MD Primary Cardiologist Dr. Parks Bollman Reason for Consultation aortic stenosis, chest pain  HPI: Brianna Dalton is a 78 y.o. female  with a past medical history of chronic HFpEF, moderate aortic stenosis, hyperlipidemia, hypertension, obesity, peripheral edema, CKD stage III, bilateral carotid artery stenosis who presented to the ED on 07/08/2023 for chest pain, SOB, fatigue, lightheadedness.  Cardiology was consulted for further evaluation.   Patient presented to the ED with chest pain, mild shortness of breath, lightheadedness and fatigue. Work up in the ED notable for sodium 140, potassium 3.6, creatinine 1.13, hemoglobin 14.4, platelets 181. CXR with no acute cardiopulmonary disease.  BNP mildly elevated at 120.  Trops is minimally elevated and flat 26 > 32 > 20.  EKG with sinus rhythm rate 98 bpm without acute ischemic changes.  Resumed on home cardiac medications.  At the time of my evaluation this AM, patient was resting comfortably in hospital bed at a incline.  Discussed patient's symptoms in further detail.  Patient states she was having some chest tightness however reports her chest tightness has improved since admission.  Patient reports mild shortness of breath, lightheadedness and fatigue.  Endorses orthopnea.  Patient states she tries to walk about 10,000 steps every 3 days and when states she noticed that she has been more short of breath with her walking.  Patient states she feels better compared to yesterday.  [facility] 2019 (Dr. Cara Chancellor) The left ventricular systolic function is normal. LV end diastolic pressure is normal. The left ventricular ejection fraction is greater than 65% by visual estimate. There is no mitral valve regurgitation. There is no aortic valve  stenosis. There is no mitral valve stenosis and no mitral valve prolapse evident.  Review of systems complete and found to be negative unless listed above    Past Medical History:  Diagnosis Date   Angina pectoris syndrome (HCC)    Arterial hemorrhage    Lt ankle possible venous bleed   Arthritis    Hypertension    Hypothyroidism    Neuropathy    Varicose vein of leg     Past Surgical History:  Procedure Laterality Date   ABDOMINAL HYSTERECTOMY     BREAST BIOPSY Right 01/19/2013   stereo biopsy of two areas neg   CHOLECYSTECTOMY N/A 03/28/2019   Procedure: LAPAROSCOPIC CHOLECYSTECTOMY;  Surgeon: Emmalene Hare, MD;  Location: [facility] ORS;  Service: General;  Laterality: N/A;   JOINT REPLACEMENT     lt knee   KNEE ARTHROPLASTY Right 04/27/2015   Procedure: COMPUTER ASSISTED TOTAL KNEE ARTHROPLASTY;  Surgeon: Arlyne Lame, MD;  Location: [facility] ORS;  Service: Orthopedics;  Laterality: Right;   LEFT HEART CATH AND CORONARY ANGIOGRAPHY N/A 04/10/2017   Procedure: LEFT HEART CATH AND CORONARY ANGIOGRAPHY;  Surgeon: Ronney Cola, MD;  Location: [facility] INVASIVE CV LAB;  Service: Cardiovascular;  Laterality: N/A;   SHOULDER ARTHROSCOPY Left    THYROID  SURGERY Bilateral    TUMOR REMOVAL Right    neck    (Not in a hospital admission)  Social History   Socioeconomic History   Marital status: Divorced    Spouse name: Not on file   Number of children: Not on file   Years of education: Not on file   Highest education level: Not on file  Occupational History  Not on file  Tobacco Use   Smoking status: Never   Smokeless tobacco: Never  Substance and Sexual Activity   Alcohol use: No   Drug use: No   Sexual activity: Never  Other Topics Concern   Not on file  Social History Narrative   Not on file   Social Drivers of Health   Financial Resource Strain: Low Risk  (05/07/2023)   Received from [facility]   Overall Financial Resource Strain (CARDIA)    Difficulty  of Paying Living Expenses: Not very hard  Recent Concern: Financial Resource Strain - Medium Risk (04/04/2023)   Received from [facility]   Overall Financial Resource Strain (CARDIA)    Difficulty of Paying Living Expenses: Somewhat hard  Food Insecurity: Food Insecurity Present (05/07/2023)   Received from [facility]   Hunger Vital Sign    Within the past 12 months, you worried that your food would run out before you got the money to buy more.: Never true    Within the past 12 months, the food you bought just didn't last and you didn't have money to get more.: Sometimes true  Transportation Needs: No Transportation Needs (05/07/2023)   [facility]    In the past 12 months, has lack of transportation kept you from medical appointments or from getting medications?: No    Lack of Transportation (Non-Medical): No  Physical Activity: Not on file  Stress: Not on file  Social Connections: Unknown (06/05/2021)   Received [facility]   Social Network    Social Network: Not on file  Intimate Partner Violence: Unknown (04/27/2021)   Received from [facility]   HITS    Physically Hurt: Not on file    Insult or Talk Down To: Not on file    Threaten Physical Harm: Not on file    Scream or Curse: Not on file    Family History  Problem Relation Age of Onset   Breast cancer Sister      Vitals:   07/09/23 0200 07/09/23 0330 07/09/23 0400 07/09/23 0530  BP: 138/82 123/78 123/68 130/67  Pulse: (!) 56 (!) 51 (!) 56 64  Resp: 19 15 17 19   Temp:      TempSrc:      SpO2: 97% 96% 95% 96%  Weight:      Height:        PHYSICAL EXAM General: Well-appearing elderly female, well nourished, in no acute distress. HEENT: Normocephalic and atraumatic. Neck: No JVD.   Lungs: Normal respiratory effort on room air. Clear bilaterally to auscultation. No wheezes, crackles, rhonchi.  Heart: HRRR. Normal S1  and S2, + diastolic murmur.  Abdomen: Non-distended appearing.  Msk: Normal strength and tone for age. Extremities: Warm and well perfused. No clubbing, cyanosis. No edema.  Neuro: Alert and oriented X 3. Psych: Answers questions appropriately.   Labs: Basic Metabolic Panel: Recent Labs    07/08/23 1448  NA 140  K 3.6  CL 107  CO2 23  GLUCOSE 145*  BUN 17  CREATININE 1.13*  CALCIUM  9.2   Liver Function Tests: No results for input(s): AST, ALT, ALKPHOS, BILITOT, PROT, ALBUMIN in the last 72 hours. No results for input(s): LIPASE, AMYLASE in the last 72 hours. CBC: Recent Labs    07/08/23 1448 07/09/23 0500  WBC 5.5 5.7  HGB 14.4 13.0  HCT 44.0 39.8  MCV 89.2 89.0  PLT  181 190   Cardiac Enzymes: Recent Labs    07/08/23 1448 07/08/23 2118 07/08/23 2345  TROPONINIHS 26* 32* 30*   BNP: Recent Labs    07/08/23 1448  BNP 118.9*   D-Dimer: No results for input(s): DDIMER in the last 72 hours. Hemoglobin A1C: Recent Labs    07/08/23 2345  HGBA1C 5.8*   Fasting Lipid Panel: Recent Labs    07/08/23 2118  CHOL 136  HDL 52  LDLCALC 62  TRIG 110  CHOLHDL 2.6   Thyroid  Function Tests: No results for input(s): TSH, T4TOTAL, T3FREE, THYROIDAB in the last 72 hours.  Invalid input(s): FREET3 Anemia Panel: No results for input(s): VITAMINB12, FOLATE, FERRITIN, TIBC, IRON, RETICCTPCT in the last 72 hours.   Radiology: DG Chest 2 View Result Date: 07/08/2023 CLINICAL DATA:  Midsternal chest pain, tenderness EXAM: CHEST - 2 VIEW COMPARISON:  11/15/2022 FINDINGS: Frontal and lateral views of the chest demonstrate a stable cardiac silhouette. No acute airspace disease, effusion, or pneumothorax. No acute bony abnormalities. IMPRESSION: 1. Stable chest, no acute process. Electronically Signed   By: Bobbye Burrow M.D.   On: 07/08/2023 15:41    ECHO ordered  TELEMETRY reviewed by me 07/09/2023: Sinus rhythm, rate 60s  EKG  reviewed by me: sinus rhythm rate 98 bpm without acute ischemic changes  Data reviewed by me 07/09/2023: last 24h vitals tele labs imaging I/O ED provider note, admission H&P.  Principal Problem:   Chest pain Active Problems:   HLD (hyperlipidemia)   Adult hypothyroidism   Moderate aortic stenosis   Essential hypertension   Obesity (BMI 30-39.9)   Chronic diastolic CHF (congestive heart failure) (HCC)   Chronic kidney disease, stage 3a (HCC)   Diarrhea    ASSESSMENT AND PLAN:  Brianna Dalton is a 78 y.o. female  with a past medical history of chronic HFpEF, moderate aortic stenosis, hyperlipidemia, hypertension, obesity, peripheral edema, CKD stage III, bilateral carotid artery stenosis who presented to the ED on 07/08/2023 for chest pain.  Cardiology was consulted for further evaluation.   # Moderate Aortic Stenosis # Hypertension # Hyperlipidemia # Chronic HFpEF Patient presents with chest pain, mild SOB, fatigue, lightheadedness. BNP mildly elevated at 120.  Trops is minimally elevated and flat 26 > 32 > 20.  EKG with sinus rhythm rate 98 bpm without acute ischemic changes. Patient appears euvolemic on exam. -Echo ordered. Further recommendations pending results. -Depending on echo results may consider performing heart catheterization or CTA to further evaluate aortic stenosis.  -Continue aspirin  81 mg, atorvastatin  40 mg daily. -Continue Imdur 30 mg daily. -Continue losartan  50 mg daily. -Continue metoprolol  to tartrate 12.5 mg twice daily. -Hold home torsemide  for now. May resume for signs of fluid overload.  -Recommend patient evaluate for OSA, have sleep study done outpatient. -Minimally elevated and flat troponins is most consistent with demand/supply mismatch and not ACS   This patient's plan of care was discussed and created with Dr. Beau Bound and he is in agreement.  Signed: Creighton Doffing, PA-C  07/09/2023, 7:12 AM [facility]

## 2023-07-09 NOTE — ED Notes (Signed)
Informed RN bed assigned 

## 2023-07-10 ENCOUNTER — Encounter
Admission: EM | Disposition: A | Discharge status: Home / Self Care | Payer: Self-pay | Source: Home / Self Care | Attending: Emergency Medicine

## 2023-07-10 ENCOUNTER — Telehealth (HOSPITAL_COMMUNITY): Payer: Self-pay | Admitting: Pharmacy Technician

## 2023-07-10 ENCOUNTER — Other Ambulatory Visit (HOSPITAL_COMMUNITY): Payer: Self-pay

## 2023-07-10 DIAGNOSIS — R079 Chest pain, unspecified: Secondary | ICD-10-CM | POA: Diagnosis not present

## 2023-07-10 HISTORY — PX: LEFT HEART CATH AND CORONARY ANGIOGRAPHY: CATH118249

## 2023-07-10 LAB — BASIC METABOLIC PANEL WITH GFR
Anion gap: 8 (ref 5–15)
BUN: 20 mg/dL (ref 8–23)
CO2: 26 mmol/L (ref 22–32)
Calcium: 9.1 mg/dL (ref 8.9–10.3)
Chloride: 107 mmol/L (ref 98–111)
Creatinine, Ser: 0.87 mg/dL (ref 0.44–1.00)
GFR, Estimated: >60 mL/min (ref >60)
Glucose, Bld: 101 mg/dL — ABNORMAL HIGH (ref 70–99)
Potassium: 4 mmol/L (ref 3.5–5.1)
Sodium: 141 mmol/L (ref 135–145)

## 2023-07-10 LAB — CBC
HCT: 37.7 % (ref 36.0–46.0)
Hemoglobin: 12.1 g/dL (ref 12.0–15.0)
MCH: 28.5 pg (ref 26.0–34.0)
MCHC: 32.1 g/dL (ref 30.0–36.0)
MCV: 88.7 fL (ref 80.0–100.0)
Platelets: 166 10*3/uL (ref 150–400)
RBC: 4.25 MIL/uL (ref 3.87–5.11)
RDW: 13.1 % (ref 11.5–15.5)
WBC: 5.2 10*3/uL (ref 4.0–10.5)
nRBC: 0 % (ref 0.0–0.2)

## 2023-07-10 SURGERY — LEFT HEART CATH AND CORONARY ANGIOGRAPHY
Anesthesia: Moderate Sedation

## 2023-07-10 MED ORDER — HEPARIN (PORCINE) IN NACL 1000-0.9 UT/500ML-% IV SOLN
INTRAVENOUS | Status: AC
  Filled 2023-07-10: qty 1000

## 2023-07-10 MED ORDER — SODIUM CHLORIDE 0.9% FLUSH: 3.0000 mL | Freq: Two times a day (BID) | INTRAVENOUS | Status: DC

## 2023-07-10 MED ORDER — FENTANYL CITRATE (PF) 100 MCG/2ML IJ SOLN
INTRAMUSCULAR | Status: DC | PRN
  Administered 2023-07-10: 50 ug via INTRAVENOUS

## 2023-07-10 MED ORDER — IOHEXOL 300 MG/ML  SOLN
INTRAMUSCULAR | Status: DC | PRN
  Administered 2023-07-10: 50 mL

## 2023-07-10 MED ORDER — VERAPAMIL HCL 2.5 MG/ML IV SOLN
INTRAVENOUS | Status: DC | PRN
  Administered 2023-07-10: 2.5 mg via INTRA_ARTERIAL

## 2023-07-10 MED ORDER — LIDOCAINE HCL (PF) 1 % IJ SOLN
INTRAMUSCULAR | Status: DC | PRN
  Administered 2023-07-10: 2 mL

## 2023-07-10 MED ORDER — MIDAZOLAM HCL 2 MG/2ML IJ SOLN
INTRAMUSCULAR | Status: AC
  Filled 2023-07-10: qty 2

## 2023-07-10 MED ORDER — SODIUM CHLORIDE 0.9 % WEIGHT BASED INFUSION
1.0000 mL/kg/h | INTRAVENOUS | Status: DC
  Administered 2023-07-10: 1 mL/kg/h via INTRAVENOUS

## 2023-07-10 MED ORDER — SODIUM CHLORIDE 0.9% FLUSH
3.0000 mL | Freq: Two times a day (BID) | INTRAVENOUS | Status: DC
  Administered 2023-07-10: 3 mL via INTRAVENOUS

## 2023-07-10 MED ORDER — SODIUM CHLORIDE 0.9 % IV SOLN: 250.0000 mL | INTRAVENOUS | Status: DC | PRN

## 2023-07-10 MED ORDER — SPIRONOLACTONE 12.5 MG HALF TABLET
12.5000 mg | ORAL_TABLET | Freq: Every day | ORAL | Status: DC
  Filled 2023-07-10: qty 1

## 2023-07-10 MED ORDER — HEPARIN SODIUM (PORCINE) 1000 UNIT/ML IJ SOLN
INTRAMUSCULAR | Status: AC
  Filled 2023-07-10: qty 10

## 2023-07-10 MED ORDER — SODIUM CHLORIDE 0.9% FLUSH: 3.0000 mL | INTRAVENOUS | Status: DC | PRN

## 2023-07-10 MED ORDER — LIDOCAINE HCL 1 % IJ SOLN
INTRAMUSCULAR | Status: AC
  Filled 2023-07-10: qty 20

## 2023-07-10 MED ORDER — SODIUM CHLORIDE 0.9 % WEIGHT BASED INFUSION
3.0000 mL/kg/h | INTRAVENOUS | Status: DC
  Administered 2023-07-10: 3 mL/kg/h via INTRAVENOUS

## 2023-07-10 MED ORDER — LABETALOL HCL 5 MG/ML IV SOLN: 10.0000 mg | INTRAVENOUS | Status: DC | PRN

## 2023-07-10 MED ORDER — HEPARIN (PORCINE) IN NACL 1000-0.9 UT/500ML-% IV SOLN
INTRAVENOUS | Status: DC | PRN
  Administered 2023-07-10: 1000 mL

## 2023-07-10 MED ORDER — SODIUM CHLORIDE 0.9 % WEIGHT BASED INFUSION: 1.0000 mL/kg/h | INTRAVENOUS | Status: DC

## 2023-07-10 MED ORDER — HYDRALAZINE HCL 20 MG/ML IJ SOLN: 10.0000 mg | INTRAMUSCULAR | Status: DC | PRN

## 2023-07-10 MED ORDER — FENTANYL CITRATE (PF) 100 MCG/2ML IJ SOLN
INTRAMUSCULAR | Status: AC
Start: 2023-07-10 — End: 2023-07-10
  Filled 2023-07-10: qty 2

## 2023-07-10 MED ORDER — VERAPAMIL HCL 2.5 MG/ML IV SOLN
INTRAVENOUS | Status: AC
Start: 2023-07-10 — End: 2023-07-10
  Filled 2023-07-10: qty 2

## 2023-07-10 MED ORDER — HEPARIN SODIUM (PORCINE) 1000 UNIT/ML IJ SOLN
INTRAMUSCULAR | Status: DC | PRN
  Administered 2023-07-10: 5000 [IU] via INTRAVENOUS

## 2023-07-10 MED ORDER — MIDAZOLAM HCL 2 MG/2ML IJ SOLN
INTRAMUSCULAR | Status: DC | PRN
  Administered 2023-07-10: 1 mg via INTRAVENOUS

## 2023-07-10 SURGICAL SUPPLY — 11 items
CATH INFINITI 5 FR JL3.5 (CATHETERS) IMPLANT
CATH INFINITI 5FR JL4 (CATHETERS) IMPLANT
CATH INFINITI JR4 5F (CATHETERS) IMPLANT
DEVICE RAD TR BAND REGULAR (VASCULAR PRODUCTS) IMPLANT
DRAPE BRACHIAL (DRAPES) IMPLANT
GLIDESHEATH SLEND A-KIT 6F 22G (SHEATH) IMPLANT
GUIDEWIRE INQWIRE 1.5J.035X260 (WIRE) IMPLANT
PACK CARDIAC CATH (CUSTOM PROCEDURE TRAY) ×1 IMPLANT
PAD ELECT DEFIB RADIOL ZOLL (MISCELLANEOUS) IMPLANT
SET ATX-X65L (MISCELLANEOUS) IMPLANT
STATION PROTECTION PRESSURIZED (MISCELLANEOUS) IMPLANT

## 2023-07-10 NOTE — Plan of Care (Signed)

## 2023-07-10 NOTE — Progress Notes (Signed)
 Transition of Care [facility]) - Inpatient Brief Assessment   Patient Details  Name: Brianna Dalton MRN: [id number] Date of Birth: [birth year]  Transition of [facility] CM/SW Contact:    Gerren Hoffmeier C Tannya Gonet, RN Phone Number: 07/10/2023, 11:33 AM   Clinical Narrative: Transition of Care Department [facility]) has reviewed patient. We will continue to monitor patient advancement. If new patient transition needs arise, please place a TOC consult.   Transition of Care Asessment: Insurance and Status: Insurance coverage has been reviewed Patient has primary care physician: Yes   Prior level of function:: Independent Prior/Current Home Services: No current home services Social Drivers of Health Review: SDOH reviewed no interventions necessary Readmission risk has been reviewed: Yes Transition of care needs: no transition of care needs at this time

## 2023-07-10 NOTE — Evaluation (Signed)
 Physical Therapy Evaluation Patient Details Name: Brianna Dalton MRN: [id number] DOB: May 01, 1945 Today's Date: 07/10/2023  History of Present Illness  78 y.o. female with medical history significant of dCHF, HTN, HLD, hypothyroidism, gout, CKD-3, bilateral carotid artery stenosis, lymphedema, varicose vein, neuropathy, obesity, aortic stenosis, who presents with chest pain.     Patient states she has chest pain and SOB for several days, associated with dizziness and lightheadedness.  Clinical Impression  Pt very pleasant and interactive t/o session, eager to show that she can more relatively well.  She did not need assist with getting to/from sitting, standing or during circumambulation of the nurses' station w/o AD.  She had no LOBs, appropriate rise in HR (to 80s) with activity and only minimal c/o fatigue with no lightheadedness or unsteadiness.  Pt to have heart catheterization later today, plan to see her once post-procedure to insure she is still moving well - do not suspect need for ongoing PT per her performance at that point.        If plan is discharge home, recommend the following:     Can travel by private vehicle        Equipment Recommendations None recommended by PT  Recommendations for Other Services       Functional Status Assessment Patient has had a recent decline in their functional status and demonstrates the ability to make significant improvements in function in a reasonable and predictable amount of time.     Precautions / Restrictions Precautions Precautions: Fall (moderate) Restrictions Weight Bearing Restrictions Per Provider Order: No      Mobility  Bed Mobility Overal bed mobility: Modified Independent             General bed mobility comments: gets to sitting EOB w/o assist, relative ease    Transfers Overall transfer level: Modified independent Equipment used: None               General transfer comment: able to rise to standing  w/o issue, showed good overall confidence, no LOBs or unsteadiness, no lightheadedness    Ambulation/Gait Ambulation/Gait assistance: Modified independent (Device/Increase time) Gait Distance (Feet): 200 Feet Assistive device: None         General Gait Details: able to ambulate with consistent and confident cadence, community appropriate effort w/o AD or need for UE support.  Pt's HR up from 60s to 80s with the effort, SpO2 remains in the high 90s on room air.  No reports of fatigue or chest pain with the effort.  Stairs            Wheelchair Mobility     Tilt Bed    Modified Rankin (Stroke Patients Only)       Balance Overall balance assessment: Modified Independent                                           Pertinent Vitals/Pain Pain Assessment Pain Assessment: No/denies pain (no chest pain or lightheadeness t/o the session)    Home Living Family/patient expects to be discharged to:: Private residence Living Arrangements: Alone Available Help at Discharge: Friend(s);Available PRN/intermittently   Home Access: Level entry       Home Layout: One level Home Equipment: Cane - single point;Rolling Walker (2 wheels)      Prior Function Prior Level of Function : Independent/Modified Independent;Driving  Mobility Comments: friend drives at night but otherwise able to be active and independent around the community - ADLs Comments: reports doing all her home management, cooking, etc but does need rest breaks if she is doing a lot at once     Extremity/Trunk Assessment   Upper Extremity Assessment Upper Extremity Assessment: Overall WFL for tasks assessed    Lower Extremity Assessment Lower Extremity Assessment: Overall WFL for tasks assessed       Communication   Communication Communication: No apparent difficulties    Cognition Arousal: Alert Behavior During Therapy: WFL for tasks assessed/performed   PT -  Cognitive impairments: No apparent impairments                         Following commands: Intact       Cueing Cueing Techniques: Verbal cues     General Comments General comments (skin integrity, edema, etc.): Pt moves well with confidence and reports feeling close to her baseline mobility/function    Exercises     Assessment/Plan    PT Assessment Patient needs continued PT services  PT Problem List Decreased activity tolerance;Cardiopulmonary status limiting activity       PT Treatment Interventions Gait training;Therapeutic activities;Therapeutic exercise;Patient/family education;Functional mobility training    PT Goals (Current goals can be found in the Care Plan section)  Acute Rehab PT Goals Patient Stated Goal: get her heart situation sorted out PT Goal Formulation: With patient Time For Goal Achievement: 07/23/23 Potential to Achieve Goals: Good    Frequency Min 1X/week     Co-evaluation               AM-PAC PT 6 Clicks Mobility  Outcome Measure Help needed turning from your back to your side while in a flat bed without using bedrails?: None Help needed moving from lying on your back to sitting on the side of a flat bed without using bedrails?: None Help needed moving to and from a bed to a chair (including a wheelchair)?: None Help needed standing up from a chair using your arms (e.g., wheelchair or bedside chair)?: None Help needed to walk in hospital room?: None Help needed climbing 3-5 steps with a railing? : None 6 Click Score: 24    End of Session Equipment Utilized During Treatment: Gait belt Activity Tolerance: Patient tolerated treatment well Patient left: in chair;with call bell/phone within reach;with family/visitor present Nurse Communication: Mobility status PT Visit Diagnosis: Unsteadiness on feet (R26.81);Muscle weakness (generalized) (M62.81)    Time: 4098-1191 PT Time Calculation (min) (ACUTE ONLY): 19  min   Charges:   PT Evaluation $PT Eval Low Complexity: 1 Low PT Treatments $Gait Training: 8-22 mins PT General Charges $$ ACUTE PT VISIT: 1 Visit         Darice Edelman, DPT 07/10/2023, 10:00 AM

## 2023-07-10 NOTE — Progress Notes (Signed)
 Nutrition Brief Note  RD consulted for assessment of nutritional needs and status.   Wt Readings from Last 15 Encounters:  07/10/23 118.2 kg  06/03/23 120.2 kg  04/05/23 116.6 kg  11/15/22 122.5 kg  12/13/21 126.6 kg  10/24/21 132.5 kg  01/11/21 127 kg  11/15/20 122.5 kg  04/12/20 122.8 kg  02/03/20 125.6 kg  01/26/20 125.6 kg  01/18/20 126.1 kg  05/05/19 122.9 kg  04/15/19 122.7 kg  03/29/19 127.9 kg   Pt with medical history significant of dCHF, HTN, HLD, hypothyroidism, gout, CKD-3, bilateral carotid artery stenosis, lymphedema, varicose vein, neuropathy, obesity, aortic stenosis, who presents with chest pain.   Pt admitted with chest pain.   Per cardiology notes, pt scheduled for lt heart cath today. Pt is currently NPO for procedure.   Spoke with pt at bedside, who was pleasant and in good spirits today. She acknowledges NPO status and reports being hungry (don't worry, I'll be eating after my test). Pt reports she tries to be a healthy eater and is cognizant of her food choices, due to her cardiac issues. Pt mainly bakes her food and has friends who provide her with fresh fruits and vegetables.   Pt reports long stand esophageal issues, which she manages by being mindful of her intake and chewing foods slowly. She denies any nutritional needs or issues at this time.   Pt denies any weight loss. No wt loss noted over the past 3 months.   Nutrition-Focused physical exam completed. Findings are no fat depletion, no muscle depletion, and mild edema.   Medications reviewed.   Labs reviewed.    Body mass index is 37.39 kg/m. Patient meets criteria for obesity, class II based on current BMI. Obesity is a complex, chronic medical condition that is optimally managed by a multidisciplinary care team. Weight loss is not an ideal goal for an acute inpatient hospitalization. However, if further work-up for obesity is warranted, consider outpatient referral to '[facility]  [facility].    Current diet order is NPO (held for procedure), patient is consuming approximately n/a% of meals at this time. Labs and medications reviewed.   No nutrition interventions warranted at this time. If nutrition issues arise, please consult RD.   Herschel Lords, RD, LDN, CDCES Registered Dietitian III Certified Diabetes Care and Education Specialist If unable to reach this RD, please use RD Inpatient group chat on secure chat between hours of 8am-4 pm daily

## 2023-07-10 NOTE — Care Management Obs Status (Signed)
 MEDICARE OBSERVATION STATUS NOTIFICATION   Patient Details  Name: Brianna Dalton MRN: [id number] Date of Birth: 1945/04/07   Medicare Observation Status Notification Given:  Yes    Anise Kerns 07/10/2023, 10:47 AM

## 2023-07-10 NOTE — Progress Notes (Addendum)
 [facility] CLINIC CARDIOLOGY PROGRESS NOTE       Patient ID: Brianna Dalton MRN: [id number] DOB/AGE: [birth year] 78 y.o.  Admit date: 07/08/2023 Referring Physician Dr. Mosetta Areola Primary Physician Claudius Cumins, Rosalynn Come, MD Primary Cardiologist Dr. Parks Bollman Reason for Consultation aortic stenosis, chest pain  HPI: Brianna Dalton is a 78 y.o. female  with a past medical history of chronic HFpEF, moderate aortic stenosis, hyperlipidemia, hypertension, obesity, peripheral edema, CKD stage III, bilateral carotid artery stenosis who presented to the ED on 07/08/2023 for chest pain, SOB, fatigue, lightheadedness.  Cardiology was consulted for further evaluation.   Interval History: -Patient seen and examined this AM and laying comfortably in hospital bed. Patient states she feels well this AM. Echo this admission revealed severe AS. Plan for LHC with Dr. Marco Severs at 12:30 PM to further evaluate AS. Patient is agreeable. Patient has remained NPO. -Patients BP and HR stable this AM. Overnight Tele showed no significant events.  -Patient remains on room air with stable SpO2.  -Left radial incision site is clean and dry with no evidence of significant swelling, bruising, or active bleeding.    LHC 2019 (Dr. Cara Chancellor) The left ventricular systolic function is normal. LV end diastolic pressure is normal. The left ventricular ejection fraction is greater than 65% by visual estimate. There is no mitral valve regurgitation. There is no aortic valve stenosis. There is no mitral valve stenosis and no mitral valve prolapse evident.  Review of systems complete and found to be negative unless listed above    Past Medical History:  Diagnosis Date   Angina pectoris syndrome (HCC)    Arterial hemorrhage    Lt ankle possible venous bleed   Arthritis    Hypertension    Hypothyroidism    Neuropathy    Varicose vein of leg     Past Surgical History:  Procedure Laterality Date   ABDOMINAL HYSTERECTOMY      BREAST BIOPSY Right 01/19/2013   stereo biopsy of two areas neg   CHOLECYSTECTOMY N/A 03/28/2019   Procedure: LAPAROSCOPIC CHOLECYSTECTOMY;  Surgeon: Emmalene Hare, MD;  Location: [facility] ORS;  Service: General;  Laterality: N/A;   JOINT REPLACEMENT     lt knee   KNEE ARTHROPLASTY Right 04/27/2015   Procedure: COMPUTER ASSISTED TOTAL KNEE ARTHROPLASTY;  Surgeon: Arlyne Lame, MD;  Location: [facility] ORS;  Service: Orthopedics;  Laterality: Right;   LEFT HEART CATH AND CORONARY ANGIOGRAPHY N/A 04/10/2017   Procedure: LEFT HEART CATH AND CORONARY ANGIOGRAPHY;  Surgeon: Ronney Cola, MD;  Location: [facility] INVASIVE CV LAB;  Service: Cardiovascular;  Laterality: N/A;   SHOULDER ARTHROSCOPY Left    THYROID  SURGERY Bilateral    TUMOR REMOVAL Right    neck    Medications Prior to Admission  Medication Sig Dispense Refill Last Dose/Taking   allopurinol  (ZYLOPRIM ) 300 MG tablet Take 300 mg by mouth daily as needed.  11 Taking As Needed   aspirin  EC 81 MG tablet Take 81 mg by mouth daily.   Taking   colchicine  0.6 MG tablet Take 0.6 mg by mouth as needed.    Taking As Needed   gabapentin (NEURONTIN) 300 MG capsule Take 300 mg by mouth 2 (two) times daily.   Taking   isosorbide mononitrate (IMDUR) 30 MG 24 hr tablet Take 30 mg by mouth daily.   Taking   levothyroxine  (SYNTHROID ) 125 MCG tablet Take 125 mcg by mouth daily before breakfast.    Taking   losartan  (COZAAR ) 50 MG tablet Hold  until followup with outpatient doctor since cardiologist started you on Aldactone . (Patient taking differently: Take 50 mg by mouth daily. Hold until followup with outpatient doctor since cardiologist started you on Aldactone .)   Taking Differently   metoprolol  tartrate (LOPRESSOR ) 25 MG tablet Take 12.5 mg by mouth 2 (two) times daily.   Taking   rosuvastatin (CRESTOR) 10 MG tablet Take 10 mg by mouth daily.   Taking   tiZANidine  (ZANAFLEX ) 4 MG tablet Take 1 tablet (4 mg total) by mouth every 8 (eight) hours as needed for  muscle spasms. 90 tablet 1 Taking As Needed   torsemide  (DEMADEX ) 10 MG tablet Take 10 mg by mouth daily as needed.   Taking As Needed   traMADol  (ULTRAM ) 50 MG tablet Take 1-2 tablets (50-100 mg total) by mouth every 4 (four) hours as needed for moderate pain. 30 tablet 0 Taking As Needed   ergocalciferol (VITAMIN D2) 1.25 MG (50000 UT) capsule Take by mouth. (Patient not taking: Reported on 07/09/2023)   Not Taking   Social History   Socioeconomic History   Marital status: Divorced    Spouse name: Not on file   Number of children: Not on file   Years of education: Not on file   Highest education level: Not on file  Occupational History   Not on file  Tobacco Use   Smoking status: Never   Smokeless tobacco: Never  Substance and Sexual Activity   Alcohol use: No   Drug use: No   Sexual activity: Never  Other Topics Concern   Not on file  Social History Narrative   Not on file   Social Drivers of Health   Financial Resource Strain: Low Risk  (05/07/2023)   Received from [facility]   Overall Financial Resource Strain (CARDIA)    Difficulty of Paying Living Expenses: Not very hard  Recent Concern: Financial Resource Strain - Medium Risk (04/04/2023)   Received from [facility]   Overall Financial Resource Strain (CARDIA)    Difficulty of Paying Living Expenses: Somewhat hard  Food Insecurity: No Food Insecurity (07/09/2023)   Hunger Vital Sign    Worried About Running Out of Food in the Last Year: Never true    Ran Out of Food in the Last Year: Never true  Recent Concern: Food Insecurity - Food Insecurity Present (05/07/2023)   Received from [facility]   Hunger Vital Sign    Within the past 12 months, you worried that your food would run out before you got the money to buy more.: Never true    Within the past 12 months, the food you bought just didn't last and you didn't have money to get more.: Sometimes true   Transportation Needs: No Transportation Needs (07/09/2023)   PRAPARE - Administrator, Civil Service (Medical): No    Lack of Transportation (Non-Medical): No  Physical Activity: Not on file  Stress: Not on file  Social Connections: Moderately Integrated (07/09/2023)   Social Connection and Isolation Panel    Frequency of Communication with Friends and Family: More than three times a week    Frequency of Social Gatherings with Friends and Family: Once a week    Attends Religious Services: More than 4 times per year    Active Member of [employer] or Organizations: Yes    Attends Engineer, structural: More than 4 times per year    Marital Status: Divorced  Intimate Partner Violence: Not  At Risk (07/09/2023)   Humiliation, Afraid, Rape, and Kick questionnaire    Fear of Current or Ex-Partner: No    Emotionally Abused: No    Physically Abused: No    Sexually Abused: No    Family History  Problem Relation Age of Onset   Breast cancer Sister      Vitals:   07/10/23 1251 07/10/23 1256 07/10/23 1313 07/10/23 1330  BP: 114/72 118/71 114/69 117/80  Pulse: (!) 0 (!) 51 (!) 49 (!) 50  Resp: (!) 9 15 14 10   Temp:   97.6 F (36.4 C)   TempSrc:   Axillary   SpO2: 100%  99% 98%  Weight:      Height:        PHYSICAL EXAM General: Well-appearing elderly female, well nourished, in no acute distress. HEENT: Normocephalic and atraumatic. Neck: No JVD.   Lungs: Normal respiratory effort on room air. Clear bilaterally to auscultation. No wheezes, crackles, rhonchi.  Heart: HRRR. Normal S1 and S2, + diastolic murmur.  Abdomen: Non-distended appearing.  Msk: Normal strength and tone for age. Left wrist incision site without active bleeding or significant tenderness or bruising.  Extremities: Warm and well perfused. No clubbing, cyanosis. No edema.  Neuro: Alert and oriented X 3. Psych: Answers questions appropriately.   Labs: Basic Metabolic Panel: Recent Labs     07/09/23 0500 07/10/23 0356  NA 141 141  K 4.3 4.0  CL 106 107  CO2 29 26  GLUCOSE 101* 101*  BUN 20 20  CREATININE 0.98 0.87  CALCIUM  8.8* 9.1   Liver Function Tests: No results for input(s): AST, ALT, ALKPHOS, BILITOT, PROT, ALBUMIN in the last 72 hours. No results for input(s): LIPASE, AMYLASE in the last 72 hours. CBC: Recent Labs    07/09/23 0500 07/10/23 0356  WBC 5.7 5.2  HGB 13.0 12.1  HCT 39.8 37.7  MCV 89.0 88.7  PLT 190 166   Cardiac Enzymes: Recent Labs    07/08/23 1448 07/08/23 2118 07/08/23 2345  TROPONINIHS 26* 32* 30*   BNP: Recent Labs    07/08/23 1448  BNP 118.9*   D-Dimer: No results for input(s): DDIMER in the last 72 hours. Hemoglobin A1C: Recent Labs    07/08/23 2345  HGBA1C 5.8*   Fasting Lipid Panel: Recent Labs    07/08/23 2118  CHOL 136  HDL 52  LDLCALC 62  TRIG 110  CHOLHDL 2.6   Thyroid  Function Tests: No results for input(s): TSH, T4TOTAL, T3FREE, THYROIDAB in the last 72 hours.  Invalid input(s): FREET3 Anemia Panel: No results for input(s): VITAMINB12, FOLATE, FERRITIN, TIBC, IRON, RETICCTPCT in the last 72 hours.   Radiology: CARDIAC CATHETERIZATION Result Date: 07/10/2023   Ost 1st Diag lesion is 60% stenosed.   Recommend Aspirin  81mg  daily for moderate CAD. 1.  Moderate one-vessel branch vessel disease 2.  Continue workup for aortic valve replacement   ECHOCARDIOGRAM COMPLETE Result Date: 07/09/2023    ECHOCARDIOGRAM REPORT   Patient Name:   LUISA LOUK Date of Exam: 07/09/2023 Medical Rec #:  [id number]       Height:       70.0 in Accession #:    [id number]      Weight:       270.0 lb Date of Birth:  1946/01/05       BSA:          2.371 m Patient Age:    78 years        BP:  129/70 mmHg Patient Gender: F               HR:           60 bpm. Exam Location:  [facility] Procedure: 2D Echo, Cardiac Doppler, Color Doppler and Intracardiac            Opacification Agent (Both  Spectral and Color Flow Doppler were            utilized during procedure). Indications:     Chest Pain R07.9  History:         Patient has no prior history of Echocardiogram examinations.                  CHF, CKD, stage 3, Arrythmias:Bradycardia, Signs/Symptoms:Chest                  Pain and Shortness of Breath; Risk Factors:Hypertension.  Sonographer:     Terrilee Few RCS Referring Phys:  [id number] XILIN NIU Diagnosing Phys: Antonette Batters MD IMPRESSIONS  1. Severe AS Valve area o.8cm2.  2. Left ventricular ejection fraction, by estimation, is 65 to 70%. The left ventricle has normal function. The left ventricle has no regional wall motion abnormalities. Left ventricular diastolic parameters are consistent with Grade I diastolic dysfunction (impaired relaxation).  3. Right ventricular systolic function is normal. The right ventricular size is normal.  4. The mitral valve is normal in structure. Mild mitral valve regurgitation.  5. The aortic valve is normal in structure. Aortic valve regurgitation is mild. FINDINGS  Left Ventricle: Left ventricular ejection fraction, by estimation, is 65 to 70%. The left ventricle has normal function. The left ventricle has no regional wall motion abnormalities. Definity contrast agent was given IV to delineate the left ventricular  endocardial borders. The global longitudinal strain is normal despite suboptimal segment tracking. The left ventricular internal cavity size was normal in size. There is borderline left ventricular hypertrophy. Left ventricular diastolic parameters are consistent with Grade I diastolic dysfunction (impaired relaxation). Right Ventricle: The right ventricular size is normal. No increase in right ventricular wall thickness. Right ventricular systolic function is normal. Left Atrium: Left atrial size was normal in size. Right Atrium: Right atrial size was normal in size. Pericardium: There is no evidence of pericardial effusion. Mitral Valve: The  mitral valve is normal in structure. Mild mitral valve regurgitation. Tricuspid Valve: The tricuspid valve is normal in structure. Tricuspid valve regurgitation is mild. Aortic Valve: The aortic valve is normal in structure. Aortic valve regurgitation is mild. Aortic valve mean gradient measures 28.7 mmHg. Aortic valve peak gradient measures 50.2 mmHg. Aortic valve area, by VTI measures 0.83 cm. Pulmonic Valve: The pulmonic valve was normal in structure. Pulmonic valve regurgitation is not visualized. Aorta: The ascending aorta was not well visualized. IAS/Shunts: No atrial level shunt detected by color flow Doppler. Additional Comments: Severe AS Valve area o.8cm2. 3D was performed not requiring image post processing on an independent workstation and was indeterminate.  LEFT VENTRICLE PLAX 2D LVIDd:         4.60 cm   Diastology LVIDs:         2.90 cm   LV e' medial:    5.55 cm/s LV PW:         1.10 cm   LV E/e' medial:  11.3 LV IVS:        1.10 cm   LV e' lateral:   9.25 cm/s LVOT diam:     1.90 cm  LV E/e' lateral: 6.8 LV SV:         65 LV SV Index:   27 LVOT Area:     2.84 cm  RIGHT VENTRICLE             IVC RV S prime:     13.40 cm/s  IVC diam: 2.00 cm TAPSE (M-mode): 1.7 cm LEFT ATRIUM             Index        RIGHT ATRIUM           Index LA diam:        3.50 cm 1.48 cm/m   RA Area:     14.00 cm LA Vol (A2C):   49.1 ml 20.69 ml/m  RA Volume:   30.70 ml  12.95 ml/m LA Vol (A4C):   48.0 ml 20.24 ml/m LA Biplane Vol: 48.3 ml 20.37 ml/m  AORTIC VALVE AV Area (Vmax):    0.77 cm AV Area (Vmean):   0.71 cm AV Area (VTI):     0.83 cm AV Vmax:           354.33 cm/s AV Vmean:          249.333 cm/s AV VTI:            0.778 m AV Peak Grad:      50.2 mmHg AV Mean Grad:      28.7 mmHg LVOT Vmax:         96.80 cm/s LVOT Vmean:        62.200 cm/s LVOT VTI:          0.228 m LVOT/AV VTI ratio: 0.29  AORTA Ao Root diam: 3.50 cm Ao Asc diam:  3.60 cm MITRAL VALVE               TRICUSPID VALVE MV Area (PHT): 2.08 cm     TR Peak grad:   12.4 mmHg MV Decel Time: 364 msec    TR Vmax:        176.00 cm/s MV E velocity: 62.90 cm/s MV A velocity: 77.60 cm/s  SHUNTS MV E/A ratio:  0.81        Systemic VTI:  0.23 m                            Systemic Diam: 1.90 cm Antonette Batters MD Electronically signed by Antonette Batters MD Signature Date/Time: 07/09/2023/11:02:22 PM    Final    DG Chest 2 View Result Date: 07/08/2023 CLINICAL DATA:  Midsternal chest pain, tenderness EXAM: CHEST - 2 VIEW COMPARISON:  11/15/2022 FINDINGS: Frontal and lateral views of the chest demonstrate a stable cardiac silhouette. No acute airspace disease, effusion, or pneumothorax. No acute bony abnormalities. IMPRESSION: 1. Stable chest, no acute process. Electronically Signed   By: Bobbye Burrow M.D.   On: 07/08/2023 15:41    ECHO as above  TELEMETRY reviewed by me 07/10/2023: Sinus rhythm, rate 60s  EKG reviewed by me: sinus rhythm rate 98 bpm without acute ischemic changes  Data reviewed by me 07/10/2023: last 24h vitals tele labs imaging I/O hospitalist progress note  Principal Problem:   Chest pain Active Problems:   HLD (hyperlipidemia)   Adult hypothyroidism   Moderate aortic stenosis   Essential hypertension   Obesity (BMI 30-39.9)   Chronic diastolic CHF (congestive heart failure) (HCC)   Chronic kidney disease, stage 3a (HCC)   Diarrhea  ASSESSMENT AND PLAN:  Brianna Dalton is a 78 y.o. female  with a past medical history of chronic HFpEF, moderate aortic stenosis, hyperlipidemia, hypertension, obesity, peripheral edema, CKD stage III, bilateral carotid artery stenosis who presented to the ED on 07/08/2023 for chest pain.  Cardiology was consulted for further evaluation.   # Moderate Aortic Stenosis # Hypertension # Hyperlipidemia # Chronic HFpEF Patient presents with chest pain, mild SOB, fatigue, lightheadedness. BNP mildly elevated at 120.  Trops is minimally elevated and flat 26 > 32 > 20.  EKG with sinus rhythm  rate 98 bpm without acute ischemic changes. Patient appears euvolemic on exam. Echo this admission revealed pEF, severe aortic stenosis with AS valve area of 0.8cm2.  LHC with Dr. Philippa Bray 06/18 to further evaluate aortic stenosis. LHC revealed Ost 1st Diag lesion is 60% stenosed.  -Continue aspirin  81 mg, atorvastatin  40 mg daily. -Continue Imdur 30 mg daily. -Continue losartan  50 mg daily. -Continue metoprolol  to tartrate 12.5 mg twice daily. -Ordered spironolactone  12.5 mg daily.  -Hold off on starting SGLT2i due to high copay. (Copay is $338.55 due to a $250.00 deductible) -Recommend patient evaluate for OSA, have sleep study done outpatient. -Minimally elevated and flat troponins is most consistent with demand/supply mismatch and not ACS  -Patient will get scheduled to see Dr. Marco Severs outpatient for CT TAVR.   The patient was brought to the cardiac cath lab and underwent left heart catheterization with Dr. Philippa Bray on 06/18. The patient tolerated with procedure well without complications.  The left wrist access site was examined and found to be without significant erythema, tenderness to palpation, or apparent aneurysm. Hospital course was overall uneventful, on day of discharge the patient was ambulatory and eager to go home.    Ok for discharge today from a cardiac perspective. Follow up in clinic with Dr. Beau Bound on 06/26 at 8:30AM  This patient's plan of care was discussed and created with Dr. Beau Bound and he is in agreement.  Signed: Luellen Howson, PA-C  07/10/2023, 1:46 PM [facility] Cardiology

## 2023-07-10 NOTE — Discharge Summary (Signed)
 Brianna Dalton ZOX:[id number] DOB: 05/02/1945 DOA: 07/08/2023  PCP: Yehuda Helms, MD  Admit date: 07/08/2023 Discharge date: 07/10/2023  Time spent: 35 minutes  Recommendations for Outpatient Follow-up:  Pcp f/u Cardiology f/u 1 week     Discharge Diagnoses:  Principal Problem:   Chest pain Active Problems:   HLD (hyperlipidemia)   Essential hypertension   Chronic diastolic CHF (congestive heart failure) (HCC)   Chronic kidney disease, stage 3a (HCC)   Adult hypothyroidism   Moderate aortic stenosis   Diarrhea   Obesity (BMI 30-39.9)   Discharge Condition: stable  Diet recommendation: heart healthy  Filed Weights   07/08/23 1447 07/10/23 0500  Weight: 122.5 kg 118.2 kg    History of present illness:  From admission h and p Brianna Dalton is a 78 y.o. female with medical history significant of dCHF, HTN, HLD, hypothyroidism, gout, CKD-3, bilateral carotid artery stenosis, lymphedema, varicose vein, neuropathy, obesity, aortic stenosis, who presents with chest pain.   Patient states she has chest pain and SOB for several days, associated with dizziness and lightheadedness.  Her chest pain is located substernal area, pressure-like, moderate, nonradiating, aggravated by exertion.  She has dry cough, no fever or chills.  Patient has generalized weakness for several weeks.  Patient has diarrhea in the past several days, with 3 watery diarrhea today.  She has nausea, no vomiting or abdominal pain.  No fever or chills.  No symptoms of UTI.  No fall or head injury.  No recent long distance traveling.  Hospital Course:  Patient presents with chest pain and exertional fatigue. Troponins mildly elevated. LHC revealed 60% stenosis of ost 1st diag, continued aspirin  monotherapy advised by cardiology. TTE shows moderate aortic stenosis, cardiology advises outpatient TAVR for further workup. Evaluated by PT, no home health or other PT needs identified. Feels well. Discharged home with  plans for 1 week cardiology f/u.  Procedures: LHC   Consultations: cardiology  Discharge Exam: Vitals:   07/10/23 1400 07/10/23 1415  BP: 104/66 108/63  Pulse: (!) 56 (!) 58  Resp: 20 15  Temp:    SpO2: 96% 95%    General: NAD Cardiovascular: RRR, moderate harsh holosystolic murmur Respiratory: CTAB   Discharge Instructions   Discharge Instructions     Diet - low sodium heart healthy   Complete by: As directed    Increase activity slowly   Complete by: As directed       Allergies as of 07/10/2023       Reactions   Oxycodone -acetaminophen  Hives, Rash, Shortness Of Breath, Swelling   Strawberry Flavoring Agent (non-screening) Shortness Of Breath, Rash   Aloe Vera Hives   Amlodipine    Hydrocodone Hives   Latex    NEGATIVE BY IgE (<0.10)   Morphine  And Codeine Other (See Comments)   Nausea delusion   Penicillins Hives   Has patient had a PCN reaction causing immediate rash, facial/tongue/throat swelling, SOB or lightheadedness with hypotension: Yes Has patient had a PCN reaction causing severe rash involving mucus membranes or skin necrosis: No Has patient had a PCN reaction that required hospitalization: No Has patient had a PCN reaction occurring within the last 10 years: No If all of the above answers are NO, then may proceed with Cephalosporin use.   Meperidine Rash        Medication List     TAKE these medications    allopurinol  300 MG tablet Commonly known as: ZYLOPRIM  Take 300 mg by mouth daily as needed.  aspirin  EC 81 MG tablet Take 81 mg by mouth daily.   colchicine  0.6 MG tablet Take 0.6 mg by mouth as needed.   ergocalciferol 1.25 MG (50000 UT) capsule Commonly known as: VITAMIN D2 Take by mouth.   gabapentin 300 MG capsule Commonly known as: NEURONTIN Take 300 mg by mouth 2 (two) times daily.   isosorbide mononitrate 30 MG 24 hr tablet Commonly known as: IMDUR Take 30 mg by mouth daily.   levothyroxine  125 MCG  tablet Commonly known as: SYNTHROID  Take 125 mcg by mouth daily before breakfast.   losartan  50 MG tablet Commonly known as: COZAAR  Hold until followup with outpatient doctor since cardiologist started you on Aldactone . What changed:  how much to take how to take this when to take this   metoprolol  tartrate 25 MG tablet Commonly known as: LOPRESSOR  Take 12.5 mg by mouth 2 (two) times daily.   rosuvastatin 10 MG tablet Commonly known as: CRESTOR Take 10 mg by mouth daily.   tiZANidine  4 MG tablet Commonly known as: Zanaflex  Take 1 tablet (4 mg total) by mouth every 8 (eight) hours as needed for muscle spasms.   torsemide  10 MG tablet Commonly known as: DEMADEX  Take 10 mg by mouth daily as needed.   traMADol  50 MG tablet Commonly known as: ULTRAM  Take 1-2 tablets (50-100 mg total) by mouth every 4 (four) hours as needed for moderate pain.       Allergies  Allergen Reactions   Oxycodone -Acetaminophen  Hives, Rash, Shortness Of Breath and Swelling   Strawberry Flavoring Agent (Non-Screening) Shortness Of Breath and Rash   Aloe Vera Hives   Amlodipine    Hydrocodone Hives   Latex     NEGATIVE BY IgE (<0.10)   Morphine  And Codeine Other (See Comments)    Nausea delusion    Penicillins Hives    Has patient had a PCN reaction causing immediate rash, facial/tongue/throat swelling, SOB or lightheadedness with hypotension: Yes Has patient had a PCN reaction causing severe rash involving mucus membranes or skin necrosis: No Has patient had a PCN reaction that required hospitalization: No Has patient had a PCN reaction occurring within the last 10 years: No If all of the above answers are NO, then may proceed with Cephalosporin use.   Meperidine Rash    Follow-up Information     Antonette Batters, MD. Go in 1 week(s).   Specialties: Cardiology, Internal Medicine Contact information: [address] [place] [address] [phone]         Yehuda Helms, MD Follow up.   Specialty: Internal Medicine Contact information: [address] [facility] [place] [address] [phone]                  The results of significant diagnostics from this hospitalization (including imaging, microbiology, ancillary and laboratory) are listed below for reference.    Significant Diagnostic Studies: CARDIAC CATHETERIZATION Result Date: 07/10/2023   Ost 1st Diag lesion is 60% stenosed.   Recommend Aspirin  81mg  daily for moderate CAD. 1.  Moderate one-vessel branch vessel disease 2.  Continue workup for aortic valve replacement   ECHOCARDIOGRAM COMPLETE Result Date: 07/09/2023    ECHOCARDIOGRAM REPORT   Patient Name:   Brianna Dalton Date of Exam: 07/09/2023 Medical Rec #:  [id number]       Height:       70.0 in Accession #:    [id number]      Weight:  270.0 lb Date of Birth:  November 16, 1945       BSA:          2.371 m Patient Age:    89 years        BP:           129/70 mmHg Patient Gender: F               HR:           60 bpm. Exam Location:  [facility] Procedure: 2D Echo, Cardiac Doppler, Color Doppler and Intracardiac            Opacification Agent (Both Spectral and Color Flow Doppler were            utilized during procedure). Indications:     Chest Pain R07.9  History:         Patient has no prior history of Echocardiogram examinations.                  CHF, CKD, stage 3, Arrythmias:Bradycardia, Signs/Symptoms:Chest                  Pain and Shortness of Breath; Risk Factors:Hypertension.  Sonographer:     Terrilee Few RCS Referring Phys:  [id number] XILIN NIU Diagnosing Phys: Antonette Batters MD IMPRESSIONS  1. Severe AS Valve area o.8cm2.  2. Left ventricular ejection fraction, by estimation, is 65 to 70%. The left ventricle has normal function. The left ventricle has no regional wall motion abnormalities. Left ventricular diastolic parameters are consistent with Grade I diastolic dysfunction (impaired relaxation).  3. Right  ventricular systolic function is normal. The right ventricular size is normal.  4. The mitral valve is normal in structure. Mild mitral valve regurgitation.  5. The aortic valve is normal in structure. Aortic valve regurgitation is mild. FINDINGS  Left Ventricle: Left ventricular ejection fraction, by estimation, is 65 to 70%. The left ventricle has normal function. The left ventricle has no regional wall motion abnormalities. Definity contrast agent was given IV to delineate the left ventricular  endocardial borders. The global longitudinal strain is normal despite suboptimal segment tracking. The left ventricular internal cavity size was normal in size. There is borderline left ventricular hypertrophy. Left ventricular diastolic parameters are consistent with Grade I diastolic dysfunction (impaired relaxation). Right Ventricle: The right ventricular size is normal. No increase in right ventricular wall thickness. Right ventricular systolic function is normal. Left Atrium: Left atrial size was normal in size. Right Atrium: Right atrial size was normal in size. Pericardium: There is no evidence of pericardial effusion. Mitral Valve: The mitral valve is normal in structure. Mild mitral valve regurgitation. Tricuspid Valve: The tricuspid valve is normal in structure. Tricuspid valve regurgitation is mild. Aortic Valve: The aortic valve is normal in structure. Aortic valve regurgitation is mild. Aortic valve mean gradient measures 28.7 mmHg. Aortic valve peak gradient measures 50.2 mmHg. Aortic valve area, by VTI measures 0.83 cm. Pulmonic Valve: The pulmonic valve was normal in structure. Pulmonic valve regurgitation is not visualized. Aorta: The ascending aorta was not well visualized. IAS/Shunts: No atrial level shunt detected by color flow Doppler. Additional Comments: Severe AS Valve area o.8cm2. 3D was performed not requiring image post processing on an independent workstation and was indeterminate.  LEFT  VENTRICLE PLAX 2D LVIDd:         4.60 cm   Diastology LVIDs:         2.90 cm   LV e' medial:    5.55  cm/s LV PW:         1.10 cm   LV E/e' medial:  11.3 LV IVS:        1.10 cm   LV e' lateral:   9.25 cm/s LVOT diam:     1.90 cm   LV E/e' lateral: 6.8 LV SV:         65 LV SV Index:   27 LVOT Area:     2.84 cm  RIGHT VENTRICLE             IVC RV S prime:     13.40 cm/s  IVC diam: 2.00 cm TAPSE (M-mode): 1.7 cm LEFT ATRIUM             Index        RIGHT ATRIUM           Index LA diam:        3.50 cm 1.48 cm/m   RA Area:     14.00 cm LA Vol (A2C):   49.1 ml 20.69 ml/m  RA Volume:   30.70 ml  12.95 ml/m LA Vol (A4C):   48.0 ml 20.24 ml/m LA Biplane Vol: 48.3 ml 20.37 ml/m  AORTIC VALVE AV Area (Vmax):    0.77 cm AV Area (Vmean):   0.71 cm AV Area (VTI):     0.83 cm AV Vmax:           354.33 cm/s AV Vmean:          249.333 cm/s AV VTI:            0.778 m AV Peak Grad:      50.2 mmHg AV Mean Grad:      28.7 mmHg LVOT Vmax:         96.80 cm/s LVOT Vmean:        62.200 cm/s LVOT VTI:          0.228 m LVOT/AV VTI ratio: 0.29  AORTA Ao Root diam: 3.50 cm Ao Asc diam:  3.60 cm MITRAL VALVE               TRICUSPID VALVE MV Area (PHT): 2.08 cm    TR Peak grad:   12.4 mmHg MV Decel Time: 364 msec    TR Vmax:        176.00 cm/s MV E velocity: 62.90 cm/s MV A velocity: 77.60 cm/s  SHUNTS MV E/A ratio:  0.81        Systemic VTI:  0.23 m                            Systemic Diam: 1.90 cm Antonette Batters MD Electronically signed by Antonette Batters MD Signature Date/Time: 07/09/2023/11:02:22 PM    Final    DG Chest 2 View Result Date: 07/08/2023 CLINICAL DATA:  Midsternal chest pain, tenderness EXAM: CHEST - 2 VIEW COMPARISON:  11/15/2022 FINDINGS: Frontal and lateral views of the chest demonstrate a stable cardiac silhouette. No acute airspace disease, effusion, or pneumothorax. No acute bony abnormalities. IMPRESSION: 1. Stable chest, no acute process. Electronically Signed   By: Bobbye Burrow M.D.   On: 07/08/2023  15:41    Microbiology: Recent Results (from the past 240 hours)  C Difficile Quick Screen w PCR reflex     Status: None   Collection Time: 07/09/23 10:03 AM   Specimen: STOOL  Result Value Ref Range Status   C Diff antigen NEGATIVE NEGATIVE Final  C Diff toxin NEGATIVE NEGATIVE Final   C Diff interpretation No C. difficile detected.  Final    Comment: Performed at [facility], [address] [place], [place] [address]  Gastrointestinal Panel by PCR , Stool     Status: None   Collection Time: 07/09/23 10:03 AM   Specimen: Stool  Result Value Ref Range Status   Campylobacter species NOT DETECTED NOT DETECTED Final   Plesimonas shigelloides NOT DETECTED NOT DETECTED Final   Salmonella species NOT DETECTED NOT DETECTED Final   Yersinia enterocolitica NOT DETECTED NOT DETECTED Final   Vibrio species NOT DETECTED NOT DETECTED Final   Vibrio cholerae NOT DETECTED NOT DETECTED Final   Enteroaggregative E coli (EAEC) NOT DETECTED NOT DETECTED Final   Enteropathogenic E coli (EPEC) NOT DETECTED NOT DETECTED Final   Enterotoxigenic E coli (ETEC) NOT DETECTED NOT DETECTED Final   Shiga like toxin producing E coli (STEC) NOT DETECTED NOT DETECTED Final   Shigella/Enteroinvasive E coli (EIEC) NOT DETECTED NOT DETECTED Final   Cryptosporidium NOT DETECTED NOT DETECTED Final   Cyclospora cayetanensis NOT DETECTED NOT DETECTED Final   Entamoeba histolytica NOT DETECTED NOT DETECTED Final   Giardia lamblia NOT DETECTED NOT DETECTED Final   Adenovirus F40/41 NOT DETECTED NOT DETECTED Final   Astrovirus NOT DETECTED NOT DETECTED Final   Norovirus GI/GII NOT DETECTED NOT DETECTED Final   Rotavirus A NOT DETECTED NOT DETECTED Final   Sapovirus (I, II, IV, and V) NOT DETECTED NOT DETECTED Final    Comment: Performed at [facility], [address] [place], [place] [address]     Labs: Basic Metabolic Panel: Recent Labs  Lab 07/08/23 1448 07/09/23 0500 07/10/23 0356  NA  140 141 141  K 3.6 4.3 4.0  CL 107 106 107  CO2 23 29 26   GLUCOSE 145* 101* 101*  BUN 17 20 20   CREATININE 1.13* 0.98 0.87  CALCIUM  9.2 8.8* 9.1   Liver Function Tests: No results for input(s): AST, ALT, ALKPHOS, BILITOT, PROT, ALBUMIN in the last 168 hours. No results for input(s): LIPASE, AMYLASE in the last 168 hours. No results for input(s): AMMONIA in the last 168 hours. CBC: Recent Labs  Lab 07/08/23 1448 07/09/23 0500 07/10/23 0356  WBC 5.5 5.7 5.2  HGB 14.4 13.0 12.1  HCT 44.0 39.8 37.7  MCV 89.2 89.0 88.7  PLT 181 190 166   Cardiac Enzymes: No results for input(s): CKTOTAL, CKMB, CKMBINDEX, TROPONINI in the last 168 hours. BNP: BNP (last 3 results) Recent Labs    07/08/23 1448  BNP 118.9*    ProBNP (last 3 results) No results for input(s): PROBNP in the last 8760 hours.  CBG: No results for input(s): GLUCAP in the last 168 hours.     Signed:  Raymonde Calico MD.  [facility] 07/10/2023, 2:27 PM

## 2023-07-10 NOTE — Telephone Encounter (Signed)
 Patient Product/process development scientist completed.    The patient is insured through [employer]. Patient has Medicare and is not eligible for a copay card, but may be able to apply for patient assistance or Medicare RX Payment Plan (Patient Must reach out to their plan, if eligible for payment plan), if available.    Ran test claim for Farxiga 10 mg and the current 30 day co-pay is $338.55 due to a $250.00 deductible.   This test claim was processed through [facility]- copay amounts may vary at other pharmacies due to pharmacy/plan contracts, or as the patient moves through the different stages of their insurance plan.     Morgan Arab, CPHT Pharmacy Technician III Certified Patient Advocate [facility] Patient Advocate Team Direct Number: [phone]  Fax: [phone]

## 2023-07-10 NOTE — Progress Notes (Signed)
 OT Cancellation Note  Patient Details Name: Brianna Dalton MRN: [id number] DOB: 1945/03/15   Cancelled Treatment:    Reason Eval/Treat Not Completed: OT screened, no needs identified, will sign off. Pt seen ambulating in the hallway with PT, per PT pt with no acute OT needs. OT spoke with pt who denies any ADL difficulties and denies acute OT needs. Edu on shower chair use vs sponge bathing until she is less fatigued. Pt verbalized understanding. OT to sign off in house and can be consulted again if further needs arise after heart cath.  Kathalene Sporer E Arshi Duarte 07/10/2023, 9:48 AM

## 2023-07-19 ENCOUNTER — Other Ambulatory Visit: Payer: Self-pay | Admitting: Internal Medicine

## 2023-07-19 DIAGNOSIS — I350 Nonrheumatic aortic (valve) stenosis: Secondary | ICD-10-CM

## 2023-07-19 DIAGNOSIS — I700 Atherosclerosis of aorta: Secondary | ICD-10-CM

## 2023-07-22 ENCOUNTER — Emergency Department
Admission: EM | Admit: 2023-07-22 | Discharge: 2023-07-22 | Disposition: A | Discharge status: Home / Self Care | Attending: Emergency Medicine | Admitting: Emergency Medicine

## 2023-07-22 ENCOUNTER — Other Ambulatory Visit: Payer: Self-pay

## 2023-07-22 ENCOUNTER — Emergency Department

## 2023-07-22 DIAGNOSIS — R42 Dizziness and giddiness: Secondary | ICD-10-CM | POA: Insufficient documentation

## 2023-07-22 DIAGNOSIS — W19XXXA Unspecified fall, initial encounter: Secondary | ICD-10-CM

## 2023-07-22 DIAGNOSIS — I5030 Unspecified diastolic (congestive) heart failure: Secondary | ICD-10-CM | POA: Diagnosis not present

## 2023-07-22 DIAGNOSIS — W010XXA Fall on same level from slipping, tripping and stumbling without subsequent striking against object, initial encounter: Secondary | ICD-10-CM | POA: Insufficient documentation

## 2023-07-22 DIAGNOSIS — I130 Hypertensive heart and chronic kidney disease with heart failure and stage 1 through stage 4 chronic kidney disease, or unspecified chronic kidney disease: Secondary | ICD-10-CM | POA: Insufficient documentation

## 2023-07-22 DIAGNOSIS — R55 Syncope and collapse: Secondary | ICD-10-CM

## 2023-07-22 DIAGNOSIS — N189 Chronic kidney disease, unspecified: Secondary | ICD-10-CM | POA: Insufficient documentation

## 2023-07-22 DIAGNOSIS — S022XXA Fracture of nasal bones, initial encounter for closed fracture: Secondary | ICD-10-CM | POA: Insufficient documentation

## 2023-07-22 DIAGNOSIS — E039 Hypothyroidism, unspecified: Secondary | ICD-10-CM | POA: Insufficient documentation

## 2023-07-22 DIAGNOSIS — S0992XA Unspecified injury of nose, initial encounter: Secondary | ICD-10-CM | POA: Diagnosis present

## 2023-07-22 HISTORY — DX: Atherosclerotic heart disease of native coronary artery without angina pectoris: I25.10

## 2023-07-22 LAB — CBC
HCT: 44.2 % (ref 36.0–46.0)
Hemoglobin: 14.4 g/dL (ref 12.0–15.0)
MCH: 29.1 pg (ref 26.0–34.0)
MCHC: 32.6 g/dL (ref 30.0–36.0)
MCV: 89.3 fL (ref 80.0–100.0)
Platelets: 185 10*3/uL (ref 150–400)
RBC: 4.95 MIL/uL (ref 3.87–5.11)
RDW: 13.2 % (ref 11.5–15.5)
WBC: 6.3 10*3/uL (ref 4.0–10.5)
nRBC: 0 % (ref 0.0–0.2)

## 2023-07-22 LAB — BASIC METABOLIC PANEL WITH GFR
Anion gap: 10 (ref 5–15)
BUN: 13 mg/dL (ref 8–23)
CO2: 27 mmol/L (ref 22–32)
Calcium: 9.3 mg/dL (ref 8.9–10.3)
Chloride: 104 mmol/L (ref 98–111)
Creatinine, Ser: 1.04 mg/dL — ABNORMAL HIGH (ref 0.44–1.00)
GFR, Estimated: 55 mL/min — ABNORMAL LOW (ref >60)
Glucose, Bld: 142 mg/dL — ABNORMAL HIGH (ref 70–99)
Potassium: 4.2 mmol/L (ref 3.5–5.1)
Sodium: 141 mmol/L (ref 135–145)

## 2023-07-22 LAB — TROPONIN I (HIGH SENSITIVITY): Troponin I (High Sensitivity): 11 ng/L (ref <18)

## 2023-07-22 NOTE — ED Triage Notes (Signed)
 Pt to ED for fall onto face after stumbling. Did feel lightheaded right after fall. Had heart cath 1 week ago. Has upcoming valve replacement surgery (unsure which valve). Has swelling to R nose/asymmetry to bridge of nose.

## 2023-07-22 NOTE — ED Provider Notes (Signed)
 [facility] Provider Note    Event Date/Time   First MD Initiated Contact with Patient 07/22/23 1253     (approximate)   History   Chief Complaint Fall and Dizziness   HPI  Brianna Dalton is a 78 y.o. female with past medical history of hypertension, hyperlipidemia, diastolic CHF, aortic stenosis, CKD, hypothyroidism, and gout who presents to the ED complaining of fall.  Patient reports that she had just stood up off of the couch when she began to feel very dizzy and lightheaded.  She did not lose consciousness, but felt weak enough that she fell to the ground and hit her head on the floor.  She denies losing consciousness, but has had significant pain around her forehead, nose, and neck since the fall.  She denies any associated chest pain or shortness of breath with the episode, has not had any ongoing lightheadedness since the initial fall.  She does not take a blood thinner.     Physical Exam   Triage Vital Signs: ED Triage Vitals  Encounter Vitals Group     BP 07/22/23 1238 125/77     Girls Systolic BP Percentile --      Girls Diastolic BP Percentile --      Boys Systolic BP Percentile --      Boys Diastolic BP Percentile --      Pulse Rate 07/22/23 1238 81     Resp 07/22/23 1238 19     Temp 07/22/23 1238 98.3 F (36.8 C)     Temp Source 07/22/23 1238 Oral     SpO2 07/22/23 1238 98 %     Weight 07/22/23 1239 246 lb (111.6 kg)     Height 07/22/23 1239 5' 10 (1.778 m)     Head Circumference --      Peak Flow --      Pain Score --      Pain Loc --      Pain Education --      Exclude from Growth Chart --     Most recent vital signs: Vitals:   07/22/23 1238  BP: 125/77  Pulse: 81  Resp: 19  Temp: 98.3 F (36.8 C)  SpO2: 98%    Constitutional: Alert and oriented. Eyes: Conjunctivae are normal. Head: Right frontal scalp abrasion as well as abrasion to the bridge of the nose, no obvious deformity noted. Nose: No  congestion/rhinnorhea. Mouth/Throat: Mucous membranes are moist.  Neck: Midline cervical spine tenderness to palpation noted. Cardiovascular: Normal rate, regular rhythm. Grossly normal heart sounds.  2+ radial pulses bilaterally. Respiratory: Normal respiratory effort.  No retractions. Lungs CTAB. Gastrointestinal: Soft and nontender. No distention. Musculoskeletal: Diffuse tenderness to palpation of left knee with no obvious deformity.  No upper extremity bony tenderness to palpation. Neurologic:  Normal speech and language. No gross focal neurologic deficits are appreciated.    ED Results / Procedures / Treatments   Labs (all labs ordered are listed, but only abnormal results are displayed) Labs Reviewed  BASIC METABOLIC PANEL WITH GFR - Abnormal; Notable for the following components:      Result Value   Glucose, Bld 142 (*)    Creatinine, Ser 1.04 (*)    GFR, Estimated 55 (*)    All other components within normal limits  CBC  TROPONIN I (HIGH SENSITIVITY)     EKG  ED ECG REPORT I, Carlin Palin, the attending physician, personally viewed and interpreted this ECG.   Date: 07/22/2023  EKG  Time: 12:42  Rate: 89  Rhythm: normal sinus rhythm  Axis: Normal  Intervals:none  ST&T Change: None  RADIOLOGY CT head reviewed and interpreted by me with no hemorrhage or midline shift.  PROCEDURES:  Critical Care performed: No  Procedures   MEDICATIONS ORDERED IN ED: Medications - No data to display   IMPRESSION / MDM / ASSESSMENT AND PLAN / ED COURSE  I reviewed the triage vital signs and the nursing notes.                              78 y.o. female with past medical history of hypertension, hyperlipidemia, diastolic CHF, hypothyroidism, aortic stenosis, CKD, and gout who presents to the ED following episode of dizziness and lightheadedness leading to a fall where she struck her head.  Patient's presentation is most consistent with acute presentation with potential  threat to life or bodily function.  Differential diagnosis includes, but is not limited to, intracranial injury, cervical spine injury, arrhythmia, orthostatic hypotension, vasovagal episode, anemia, electrolyte abnormality, AKI.  Patient nontoxic-appearing and in no acute distress, vital signs are unremarkable.  EKG shows no evidence of arrhythmia or ischemia and I have low suspicion for cardiac etiology leading to her fall.  Episode sounds most consistent with orthostatic hypotension as patient had just stood up and began to feel lightheaded.  We will observe on cardiac monitor and screening labs, also check CT head, cervical spine, and maxillofacial.  CT head and cervical spine are negative for acute process, CT maxillofacial shows nasal bone fracture but is otherwise unremarkable.  Labs show stable CKD without significant anemia, leukocytosis, or electrolyte abnormality.  Troponin within normal limits and I doubt cardiac etiology for her episode.  She continues to feel well here in the ED and is ambulatory without difficulty.  She is appropriate for discharge home with outpatient follow-up, was counseled to return to the ED for new or worsening symptoms.  Patient and family agree with plan.      FINAL CLINICAL IMPRESSION(S) / ED DIAGNOSES   Final diagnoses:  Near syncope  Fall, initial encounter  Closed fracture of nasal bone, initial encounter     Rx / DC Orders   ED Discharge Orders     None        Note:  This document was prepared using Dragon voice recognition software and may include unintentional dictation errors.   Willo Dunnings, MD 07/22/23 [phone]

## 2023-08-05 ENCOUNTER — Ambulatory Visit

## 2023-08-06 ENCOUNTER — Other Ambulatory Visit (HOSPITAL_COMMUNITY): Payer: Self-pay | Admitting: Internal Medicine

## 2023-08-06 ENCOUNTER — Ambulatory Visit (HOSPITAL_COMMUNITY)
Admission: RE | Admit: 2023-08-06 | Discharge: 2023-08-06 | Discharge status: Home / Self Care | Source: Ambulatory Visit | Attending: Internal Medicine | Admitting: Internal Medicine

## 2023-08-06 DIAGNOSIS — I7121 Aneurysm of the ascending aorta, without rupture: Secondary | ICD-10-CM | POA: Insufficient documentation

## 2023-08-06 DIAGNOSIS — K5730 Diverticulosis of large intestine without perforation or abscess without bleeding: Secondary | ICD-10-CM | POA: Diagnosis not present

## 2023-08-06 DIAGNOSIS — I350 Nonrheumatic aortic (valve) stenosis: Secondary | ICD-10-CM

## 2023-08-06 DIAGNOSIS — I700 Atherosclerosis of aorta: Secondary | ICD-10-CM | POA: Diagnosis not present

## 2023-08-06 DIAGNOSIS — I517 Cardiomegaly: Secondary | ICD-10-CM | POA: Insufficient documentation

## 2023-08-06 DIAGNOSIS — I272 Pulmonary hypertension, unspecified: Secondary | ICD-10-CM | POA: Insufficient documentation

## 2023-08-06 MED ORDER — IOHEXOL 350 MG/ML SOLN
100.0000 mL | Freq: Once | INTRAVENOUS | Status: AC | PRN
  Administered 2023-08-06: 100 mL via INTRAVENOUS

## 2023-10-08 ENCOUNTER — Other Ambulatory Visit: Payer: Self-pay | Admitting: Physician Assistant

## 2023-11-12 ENCOUNTER — Other Ambulatory Visit: Payer: Self-pay

## 2023-11-12 ENCOUNTER — Emergency Department

## 2023-11-12 DIAGNOSIS — I110 Hypertensive heart disease with heart failure: Secondary | ICD-10-CM | POA: Insufficient documentation

## 2023-11-12 DIAGNOSIS — I509 Heart failure, unspecified: Secondary | ICD-10-CM | POA: Insufficient documentation

## 2023-11-12 DIAGNOSIS — K922 Gastrointestinal hemorrhage, unspecified: Secondary | ICD-10-CM | POA: Diagnosis present

## 2023-11-12 DIAGNOSIS — K2980 Duodenitis without bleeding: Secondary | ICD-10-CM | POA: Insufficient documentation

## 2023-11-12 DIAGNOSIS — Z96651 Presence of right artificial knee joint: Secondary | ICD-10-CM | POA: Insufficient documentation

## 2023-11-12 DIAGNOSIS — D696 Thrombocytopenia, unspecified: Secondary | ICD-10-CM | POA: Diagnosis not present

## 2023-11-12 DIAGNOSIS — N179 Acute kidney failure, unspecified: Secondary | ICD-10-CM | POA: Diagnosis not present

## 2023-11-12 DIAGNOSIS — E039 Hypothyroidism, unspecified: Secondary | ICD-10-CM | POA: Insufficient documentation

## 2023-11-12 DIAGNOSIS — I2510 Atherosclerotic heart disease of native coronary artery without angina pectoris: Secondary | ICD-10-CM | POA: Insufficient documentation

## 2023-11-12 DIAGNOSIS — Z79899 Other long term (current) drug therapy: Secondary | ICD-10-CM | POA: Diagnosis not present

## 2023-11-12 DIAGNOSIS — E785 Hyperlipidemia, unspecified: Secondary | ICD-10-CM | POA: Insufficient documentation

## 2023-11-12 DIAGNOSIS — K921 Melena: Secondary | ICD-10-CM | POA: Diagnosis not present

## 2023-11-12 DIAGNOSIS — Z951 Presence of aortocoronary bypass graft: Secondary | ICD-10-CM | POA: Insufficient documentation

## 2023-11-12 DIAGNOSIS — I4581 Long QT syndrome: Secondary | ICD-10-CM | POA: Insufficient documentation

## 2023-11-12 DIAGNOSIS — E876 Hypokalemia: Secondary | ICD-10-CM | POA: Diagnosis not present

## 2023-11-12 DIAGNOSIS — R55 Syncope and collapse: Secondary | ICD-10-CM | POA: Insufficient documentation

## 2023-11-12 DIAGNOSIS — Z7982 Long term (current) use of aspirin: Secondary | ICD-10-CM | POA: Diagnosis not present

## 2023-11-12 LAB — CBC
HCT: 43 % (ref 36.0–46.0)
Hemoglobin: 14 g/dL (ref 12.0–15.0)
MCH: 29.4 pg (ref 26.0–34.0)
MCHC: 32.6 g/dL (ref 30.0–36.0)
MCV: 90.1 fL (ref 80.0–100.0)
Platelets: 149 K/uL — ABNORMAL LOW (ref 150–400)
RBC: 4.77 MIL/uL (ref 3.87–5.11)
RDW: 13.9 % (ref 11.5–15.5)
WBC: 7.6 K/uL (ref 4.0–10.5)
nRBC: 0 % (ref 0.0–0.2)

## 2023-11-12 LAB — COMPREHENSIVE METABOLIC PANEL WITH GFR
ALT: 21 U/L (ref 0–44)
AST: 37 U/L (ref 15–41)
Albumin: 4.1 g/dL (ref 3.5–5.0)
Alkaline Phosphatase: 78 U/L (ref 38–126)
Anion gap: 13 (ref 5–15)
BUN: 16 mg/dL (ref 8–23)
CO2: 25 mmol/L (ref 22–32)
Calcium: 9.1 mg/dL (ref 8.9–10.3)
Chloride: 99 mmol/L (ref 98–111)
Creatinine, Ser: 1.49 mg/dL — ABNORMAL HIGH (ref 0.44–1.00)
GFR, Estimated: 36 mL/min — ABNORMAL LOW (ref >60)
Glucose, Bld: 187 mg/dL — ABNORMAL HIGH (ref 70–99)
Potassium: 3.2 mmol/L — ABNORMAL LOW (ref 3.5–5.1)
Sodium: 137 mmol/L (ref 135–145)
Total Bilirubin: 0.7 mg/dL (ref 0.0–1.2)
Total Protein: 6.6 g/dL (ref 6.5–8.1)

## 2023-11-12 LAB — TROPONIN I (HIGH SENSITIVITY): Troponin I (High Sensitivity): 16 ng/L (ref <18)

## 2023-11-12 NOTE — ED Triage Notes (Signed)
 Pt reports an episode of black stools tonight, pt reports after she became lightheaded/dizzy and began to have chest pain. Pt is no longer on blood thinners.

## 2023-11-13 ENCOUNTER — Observation Stay: Admit: 2023-11-13 | Discharge: 2023-11-13 | Disposition: A | Attending: Family Medicine

## 2023-11-13 ENCOUNTER — Observation Stay
Admission: EM | Admit: 2023-11-13 | Discharge: 2023-11-14 | Disposition: A | Discharge status: Home / Self Care | Attending: Gastroenterology | Admitting: Gastroenterology

## 2023-11-13 ENCOUNTER — Other Ambulatory Visit: Payer: Self-pay

## 2023-11-13 ENCOUNTER — Emergency Department

## 2023-11-13 DIAGNOSIS — R55 Syncope and collapse: Secondary | ICD-10-CM

## 2023-11-13 DIAGNOSIS — E876 Hypokalemia: Secondary | ICD-10-CM | POA: Diagnosis not present

## 2023-11-13 DIAGNOSIS — E785 Hyperlipidemia, unspecified: Secondary | ICD-10-CM

## 2023-11-13 DIAGNOSIS — R9431 Abnormal electrocardiogram [ECG] [EKG]: Principal | ICD-10-CM

## 2023-11-13 DIAGNOSIS — N179 Acute kidney failure, unspecified: Secondary | ICD-10-CM

## 2023-11-13 DIAGNOSIS — R002 Palpitations: Secondary | ICD-10-CM

## 2023-11-13 DIAGNOSIS — D696 Thrombocytopenia, unspecified: Secondary | ICD-10-CM | POA: Insufficient documentation

## 2023-11-13 DIAGNOSIS — E039 Hypothyroidism, unspecified: Secondary | ICD-10-CM

## 2023-11-13 DIAGNOSIS — R197 Diarrhea, unspecified: Secondary | ICD-10-CM

## 2023-11-13 DIAGNOSIS — I2510 Atherosclerotic heart disease of native coronary artery without angina pectoris: Secondary | ICD-10-CM

## 2023-11-13 DIAGNOSIS — K921 Melena: Principal | ICD-10-CM

## 2023-11-13 DIAGNOSIS — R195 Other fecal abnormalities: Secondary | ICD-10-CM

## 2023-11-13 LAB — CBC
HCT: 41.7 % (ref 36.0–46.0)
Hemoglobin: 13.6 g/dL (ref 12.0–15.0)
MCH: 29.3 pg (ref 26.0–34.0)
MCHC: 32.6 g/dL (ref 30.0–36.0)
MCV: 89.9 fL (ref 80.0–100.0)
Platelets: 142 K/uL — ABNORMAL LOW (ref 150–400)
RBC: 4.64 MIL/uL (ref 3.87–5.11)
RDW: 14.2 % (ref 11.5–15.5)
WBC: 7.8 K/uL (ref 4.0–10.5)
nRBC: 0 % (ref 0.0–0.2)

## 2023-11-13 LAB — HEMOGLOBIN AND HEMATOCRIT, BLOOD
HCT: 40.2 % (ref 36.0–46.0)
HCT: 38 % (ref 36.0–46.0)
Hemoglobin: 13 g/dL (ref 12.0–15.0)
Hemoglobin: 12.7 g/dL (ref 12.0–15.0)

## 2023-11-13 LAB — ECHOCARDIOGRAM COMPLETE
AR max vel: 1.41 cm2
AV Area VTI: 1.42 cm2
AV Area mean vel: 1.57 cm2
AV Mean grad: 16 mmHg
AV Peak grad: 30.5 mmHg
Ao pk vel: 2.76 m/s
Area-P 1/2: 2.45 cm2
Calc EF: 80.3 %
MV VTI: 2.47 cm2
S' Lateral: 2.3 cm
Single Plane A2C EF: 83.1 %
Single Plane A4C EF: 76.7 %

## 2023-11-13 LAB — BASIC METABOLIC PANEL WITH GFR
Anion gap: 10 (ref 5–15)
BUN: 14 mg/dL (ref 8–23)
CO2: 26 mmol/L (ref 22–32)
Calcium: 8.8 mg/dL — ABNORMAL LOW (ref 8.9–10.3)
Chloride: 103 mmol/L (ref 98–111)
Creatinine, Ser: 1.1 mg/dL — ABNORMAL HIGH (ref 0.44–1.00)
GFR, Estimated: 52 mL/min — ABNORMAL LOW (ref >60)
Glucose, Bld: 102 mg/dL — ABNORMAL HIGH (ref 70–99)
Potassium: 3.9 mmol/L (ref 3.5–5.1)
Sodium: 139 mmol/L (ref 135–145)

## 2023-11-13 LAB — TSH: TSH: 221 u[IU]/mL — ABNORMAL HIGH (ref 0.350–4.500)

## 2023-11-13 LAB — CBG MONITORING, ED: Glucose-Capillary: 76 mg/dL (ref 70–99)

## 2023-11-13 LAB — T4, FREE: Free T4: <0.25 ng/dL — ABNORMAL LOW (ref 0.61–1.12)

## 2023-11-13 LAB — MAGNESIUM: Magnesium: 2 mg/dL (ref 1.7–2.4)

## 2023-11-13 LAB — TROPONIN I (HIGH SENSITIVITY): Troponin I (High Sensitivity): 16 ng/L (ref <18)

## 2023-11-13 MED ORDER — LEVOTHYROXINE SODIUM 50 MCG PO TABS
125.0000 ug | ORAL_TABLET | Freq: Every day | ORAL | Status: DC
  Administered 2023-11-13: 125 ug via ORAL
  Filled 2023-11-13: qty 3

## 2023-11-13 MED ORDER — TRAMADOL HCL 50 MG PO TABS: 50.0000 mg | ORAL_TABLET | ORAL | Status: DC | PRN

## 2023-11-13 MED ORDER — ACETAMINOPHEN 650 MG RE SUPP: 650.0000 mg | Freq: Four times a day (QID) | RECTAL | Status: DC | PRN

## 2023-11-13 MED ORDER — ISOSORBIDE MONONITRATE ER 30 MG PO TB24
30.0000 mg | ORAL_TABLET | Freq: Every day | ORAL | Status: DC
  Administered 2023-11-13 – 2023-11-14 (×2): 30 mg via ORAL
  Filled 2023-11-13 (×2): qty 1

## 2023-11-13 MED ORDER — SODIUM CHLORIDE 0.9 % IV BOLUS
1000.0000 mL | Freq: Once | INTRAVENOUS | Status: AC
  Administered 2023-11-13: 1000 mL via INTRAVENOUS

## 2023-11-13 MED ORDER — SODIUM CHLORIDE 0.9% FLUSH
3.0000 mL | Freq: Two times a day (BID) | INTRAVENOUS | Status: DC
  Administered 2023-11-13 – 2023-11-14 (×3): 3 mL via INTRAVENOUS

## 2023-11-13 MED ORDER — ALLOPURINOL 300 MG PO TABS
300.0000 mg | ORAL_TABLET | Freq: Every day | ORAL | Status: DC
  Administered 2023-11-13: 300 mg via ORAL
  Filled 2023-11-13: qty 1

## 2023-11-13 MED ORDER — ACETAMINOPHEN 500 MG PO TABS
1000.0000 mg | ORAL_TABLET | Freq: Once | ORAL | Status: AC
  Administered 2023-11-13: 1000 mg via ORAL
  Filled 2023-11-13: qty 2

## 2023-11-13 MED ORDER — GABAPENTIN 300 MG PO CAPS
300.0000 mg | ORAL_CAPSULE | Freq: Two times a day (BID) | ORAL | Status: DC
  Administered 2023-11-13: 300 mg via ORAL
  Filled 2023-11-13: qty 1

## 2023-11-13 MED ORDER — TRAZODONE HCL 50 MG PO TABS: 25.0000 mg | ORAL_TABLET | Freq: Every evening | ORAL | Status: DC | PRN

## 2023-11-13 MED ORDER — PANTOPRAZOLE SODIUM 40 MG IV SOLR
40.0000 mg | Freq: Once | INTRAVENOUS | Status: AC
  Administered 2023-11-13: 40 mg via INTRAVENOUS
  Filled 2023-11-13: qty 10

## 2023-11-13 MED ORDER — POTASSIUM CHLORIDE IN NACL 20-0.9 MEQ/L-% IV SOLN
INTRAVENOUS | Status: DC
  Filled 2023-11-13 (×3): qty 1000

## 2023-11-13 MED ORDER — METOPROLOL TARTRATE 25 MG PO TABS
12.5000 mg | ORAL_TABLET | Freq: Two times a day (BID) | ORAL | Status: DC
  Filled 2023-11-13: qty 1

## 2023-11-13 MED ORDER — IOHEXOL 300 MG/ML  SOLN
75.0000 mL | Freq: Once | INTRAMUSCULAR | Status: AC | PRN
  Administered 2023-11-13: 75 mL via INTRAVENOUS

## 2023-11-13 MED ORDER — ACETAMINOPHEN 325 MG PO TABS
650.0000 mg | ORAL_TABLET | Freq: Four times a day (QID) | ORAL | Status: DC | PRN
  Administered 2023-11-13: 650 mg via ORAL
  Filled 2023-11-13: qty 2

## 2023-11-13 MED ORDER — POTASSIUM CHLORIDE CRYS ER 20 MEQ PO TBCR
40.0000 meq | EXTENDED_RELEASE_TABLET | Freq: Once | ORAL | Status: AC
  Administered 2023-11-13: 40 meq via ORAL
  Filled 2023-11-13: qty 2

## 2023-11-13 MED ORDER — ASPIRIN 81 MG PO TBEC: 81.0000 mg | DELAYED_RELEASE_TABLET | Freq: Every day | ORAL | Status: DC

## 2023-11-13 MED ORDER — MAGNESIUM HYDROXIDE 400 MG/5ML PO SUSP: 30.0000 mL | Freq: Every day | ORAL | Status: DC | PRN

## 2023-11-13 MED ORDER — COLCHICINE 0.6 MG PO TABS
0.6000 mg | ORAL_TABLET | ORAL | Status: DC | PRN
Start: 2023-11-13 — End: 2023-11-13

## 2023-11-13 MED ORDER — ROSUVASTATIN CALCIUM 10 MG PO TABS
10.0000 mg | ORAL_TABLET | Freq: Every day | ORAL | Status: DC
  Administered 2023-11-13 – 2023-11-14 (×2): 10 mg via ORAL
  Filled 2023-11-13 (×2): qty 1

## 2023-11-13 MED ORDER — LEVOTHYROXINE SODIUM 50 MCG PO TABS
100.0000 ug | ORAL_TABLET | Freq: Every day | ORAL | Status: DC
  Administered 2023-11-14: 100 ug via ORAL
  Filled 2023-11-13: qty 2

## 2023-11-13 MED ORDER — PERFLUTREN LIPID MICROSPHERE
1.0000 mL | INTRAVENOUS | Status: AC | PRN
  Administered 2023-11-13: 2 mL via INTRAVENOUS

## 2023-11-13 MED ORDER — METOCLOPRAMIDE HCL 5 MG/ML IJ SOLN: 5.0000 mg | Freq: Four times a day (QID) | INTRAMUSCULAR | Status: DC | PRN

## 2023-11-13 MED ORDER — PANTOPRAZOLE SODIUM 40 MG IV SOLR
40.0000 mg | Freq: Two times a day (BID) | INTRAVENOUS | Status: DC
  Administered 2023-11-13 – 2023-11-14 (×3): 40 mg via INTRAVENOUS
  Filled 2023-11-13 (×3): qty 10

## 2023-11-13 MED ORDER — ENOXAPARIN SODIUM 60 MG/0.6ML IJ SOSY: 0.5000 mg/kg | PREFILLED_SYRINGE | INTRAMUSCULAR | Status: DC

## 2023-11-13 NOTE — Hospital Course (Signed)
 78 y.o. Caucasian female with medical history significant for coronary artery disease, essential hypertension, aortic stenosis status post TAVR, and hypothyroidism, who presented to the emergency room with acute onset of presyncope and melena.  She went out for dinner and had an emergency to have a bowel movement.  When she was in the bathroom she had a large mixed solid and liquid black stools.  She later felt lightheaded when she got up and briefly dyspneic.  She was concerned about GI bleeding and therefore came to the ER.  No nausea or vomiting.  No bright red bleeding per rectum.  No fever or chills.  She denied abdominal pain.  No chest pain or palpitations.  No cough or wheezing or dyspnea.  No dysuria, oliguria or hematuria or flank pain.   ED Course: When she came to the ER, BP was 121/76 and later 150/86 heart rate of 63 and later 48 with otherwise normal vital signs.  Labs revealed hypokalemia 3.2 and blood glucose of 187 with creatinine 1.49 with otherwise unremarkable CMP.  High sensitive troponin I was 16 twice and CBC showed mild thrombocytopenia 149.  Hemoglobin and hematocrit were 14/43 compared to 14.4 and 44.2 on 07/22/2023. EKG as reviewed by me : EKG showed normal sinus rhythm at rate of 61 with slightly prolonged QT interval and Q waves inferiorly. Imaging: Abdominal and pelvic CT scan showed diverticulosis without diverticulitis and changes of hiatal hernia.  2 view chest x-ray showed no acute cardiopulmonary disease.   The patient was given 1 L bolus of IV normal saline, 40 mg of IV Protonix  and 1 g of p.o. Tylenol .  She will be admitted to a medical telemetry observation bed for further evaluation and management.  10/22.  Hold aspirin .  Continue IV Protonix .  GI consultation.  Since she couple bites of graham crackers this morning at 8 AM endoscopy will be tomorrow.  Serial hemoglobins. 10/23.  EGD showing duodenitis.  Switch to Protonix  orally once a day upon going home.  Hold  aspirin  upon going home.  No further black stools.  Not orthostatic.  Felt well upon going home.

## 2023-11-13 NOTE — Progress Notes (Signed)
 Anticoagulation monitoring(Lovenox ):  78 yo female ordered Lovenox  40 mg Q24h    Filed Weights   11/12/23 2033  Weight: 121.1 kg (267 lb)   BMI 38.3    Lab Results  Component Value Date   CREATININE 1.49 (H) 11/12/2023   CREATININE 1.04 (H) 07/22/2023   CREATININE 0.87 07/10/2023   Estimated Creatinine Clearance: 44.7 mL/min (A) (by C-G formula based on SCr of 1.49 mg/dL (H)). Hemoglobin & Hematocrit     Component Value Date/Time   HGB 14.0 11/12/2023 2036   HGB 15.4 12/11/2011 1700   HCT 43.0 11/12/2023 2036   HCT 45.2 12/11/2011 1700     Per Protocol for Patient with estCrcl > 30 ml/min and BMI > 30, will transition to Lovenox  60 mg Q24h.

## 2023-11-13 NOTE — ED Notes (Signed)
 Pt repositioned in bed.

## 2023-11-13 NOTE — ED Notes (Signed)
 Responded to call bell and assisted pt to the bathroom, pt reports no further needs at this time.

## 2023-11-13 NOTE — ED Notes (Signed)
Echo tech in room now.

## 2023-11-13 NOTE — Assessment & Plan Note (Addendum)
 Improved with IV fluid.  Creatinine 1.49 on presentation and down to 0.97.

## 2023-11-13 NOTE — Assessment & Plan Note (Signed)
 Patient does not take metoprolol  at home.  With bradycardia we will hold off on beta-blockers.

## 2023-11-13 NOTE — Progress Notes (Signed)
 Progress Note   Patient: Brianna Dalton FMW:[id number] DOB: Dec 17, 1945 DOA: 11/13/2023     0 DOS: the patient was seen and examined on 11/13/2023   Brief hospital course: 78 y.o. Caucasian female with medical history significant for coronary artery disease, essential hypertension, aortic stenosis status post TAVR, and hypothyroidism, who presented to the emergency room with acute onset of presyncope and melena.  She went out for dinner and had an emergency to have a bowel movement.  When she was in the bathroom she had a large mixed solid and liquid black stools.  She later felt lightheaded when she got up and briefly dyspneic.  She was concerned about GI bleeding and therefore came to the ER.  No nausea or vomiting.  No bright red bleeding per rectum.  No fever or chills.  She denied abdominal pain.  No chest pain or palpitations.  No cough or wheezing or dyspnea.  No dysuria, oliguria or hematuria or flank pain.   ED Course: When she came to the ER, BP was 121/76 and later 150/86 heart rate of 63 and later 48 with otherwise normal vital signs.  Labs revealed hypokalemia 3.2 and blood glucose of 187 with creatinine 1.49 with otherwise unremarkable CMP.  High sensitive troponin I was 16 twice and CBC showed mild thrombocytopenia 149.  Hemoglobin and hematocrit were 14/43 compared to 14.4 and 44.2 on 07/22/2023. EKG as reviewed by me : EKG showed normal sinus rhythm at rate of 61 with slightly prolonged QT interval and Q waves inferiorly. Imaging: Abdominal and pelvic CT scan showed diverticulosis without diverticulitis and changes of hiatal hernia.  2 view chest x-ray showed no acute cardiopulmonary disease.   The patient was given 1 L bolus of IV normal saline, 40 mg of IV Protonix  and 1 g of p.o. Tylenol .  She will be admitted to a medical telemetry observation bed for further evaluation and management.  10/22.  Hold aspirin .  Continue IV Protonix .  GI consultation.  Since she couple bites of graham  crackers this morning at 8 AM endoscopy will be tomorrow.  Serial hemoglobins.  Assessment and Plan: * Melena Hold aspirin .  Serial hemoglobins.  IV fluid hydration.  Case discussed with gastroenterology.  Will evaluate today for endoscopy tomorrow since she ate graham crackers this morning.  AKI (acute kidney injury) Improved with IV fluid  Hypokalemia Replaced in IV fluid  QT prolongation Patient does not take metoprolol  at home.  With bradycardia we will hold off and monitor.  Dyslipidemia Continue statin therapy.  Coronary artery disease Continue Imdur  and statin therapy and hold off aspirin .  Hypothyroidism Patient not taking any thyroid  medication.  With TSH very high need to restart.  Near syncope Likely from melena.  Could also be from dehydration.  IV fluid hydration.  Watch serial hemoglobins.         Subjective: Patient was out yesterday and developed violent diarrhea with black stool.  She had to the use while outside the hospital.  She felt lightheaded.  She does take aspirin  on a daily basis.  Feels weak.  Patient ate a few bites of graham cracker this morning.  Physical Exam: Vitals:   11/13/23 1030 11/13/23 1036 11/13/23 1223 11/13/23 1225  BP: (!) 122/96  (!) 173/74 (!) 162/95  Pulse: (!) 53  (!) 50 63  Resp: 19  18   Temp:  97.7 F (36.5 C) 98.4 F (36.9 C)   TempSrc:      SpO2: 100%  Weight:      Height:       Physical Exam HENT:     Head: Normocephalic.  Eyes:     General: Lids are normal.     Conjunctiva/sclera: Conjunctivae normal.  Cardiovascular:     Rate and Rhythm: Normal rate and regular rhythm.     Heart sounds: Normal heart sounds, S1 normal and S2 normal.  Pulmonary:     Breath sounds: No decreased breath sounds, wheezing, rhonchi or rales.  Abdominal:     Palpations: Abdomen is soft.     Tenderness: There is abdominal tenderness in the epigastric area.  Musculoskeletal:     Right lower leg: No swelling.     Left lower  leg: No swelling.  Skin:    General: Skin is warm.     Findings: No rash.  Neurological:     Mental Status: She is alert and oriented to person, place, and time.     Data Reviewed: Creatinine 1.1 with a GFR of 52, hemoglobin 13  Family Communication: Husband at bedside  Disposition: Status is: Observation With black tar-like stools will watch to see what the next bowel movement is.  Endoscopy for tomorrow morning since she ate a few graham cracker bites today.  Planned Discharge Destination: Home    Time spent: 28 minutes  Author: Charlie Patterson, MD 11/13/2023 1:16 PM  For on call review [email or web address].

## 2023-11-13 NOTE — Assessment & Plan Note (Signed)
 Hold aspirin .  Serial hemoglobins.  IV fluid hydration.  Case discussed with gastroenterology.  Will evaluate today for endoscopy tomorrow since she ate graham crackers this morning.

## 2023-11-13 NOTE — Assessment & Plan Note (Addendum)
 Likely from melena.  Could also be from dehydration.  IV fluid hydration.  Watch serial hemoglobins.

## 2023-11-13 NOTE — H&P (Addendum)
 [place]   PATIENT NAME: Brianna Dalton    MR#:  [id number]  DATE OF BIRTH:  [birth year]  DATE OF ADMISSION:  11/13/2023  PRIMARY CARE PHYSICIAN: Auston Reyes BIRCH, MD   Patient is coming from: Home  REQUESTING/REFERRING PHYSICIAN: Cyrena Mylar, MD  CHIEF COMPLAINT:   Chief Complaint  Patient presents with   Near Syncope   Rectal Bleeding    HISTORY OF PRESENT ILLNESS:  Brianna Dalton is a 78 y.o. Caucasian female with medical history significant for coronary artery disease, essential hypertension, aortic stenosis status post TAVR, and hypothyroidism, who presented to the emergency room with acute onset of presyncope and melena.  She went out for dinner and had an emergency to have a bowel movement.  When she was in the bathroom she had a large mixed solid and liquid black stools.  She later felt lightheaded when she got up and briefly dyspneic.  She was concerned about GI bleeding and therefore came to the ER.  No nausea or vomiting.  No bright red bleeding per rectum.  No fever or chills.  She denied abdominal pain.  No chest pain or palpitations.  No cough or wheezing or dyspnea.  No dysuria, oliguria or hematuria or flank pain.  ED Course: When she came to the ER, BP was 121/76 and later 150/86 heart rate of 63 and later 48 with otherwise normal vital signs.  Labs revealed hypokalemia 3.2 and blood glucose of 187 with creatinine 1.49 with otherwise unremarkable CMP.  High sensitive troponin I was 16 twice and CBC showed mild thrombocytopenia 149.  Hemoglobin and hematocrit were 14/43 compared to 14.4 and 44.2 on 07/22/2023. EKG as reviewed by me : EKG showed normal sinus rhythm at rate of 61 with slightly prolonged QT interval and Q waves inferiorly. Imaging: Abdominal and pelvic CT scan showed diverticulosis without diverticulitis and changes of hiatal hernia.  2 view chest x-ray showed no acute cardiopulmonary disease.  The patient was given 1 L bolus of IV normal saline,  40 mg of IV Protonix  and 1 g of p.o. Tylenol .  She will be admitted to a medical telemetry observation bed for further evaluation and management. PAST MEDICAL HISTORY:   Past Medical History:  Diagnosis Date   Angina pectoris syndrome    Arterial hemorrhage    Lt ankle possible venous bleed   Arthritis    Coronary artery disease    Hypertension    Hypothyroidism    Neuropathy    Varicose vein of leg   -Aortic stenosis status post TAVR.  PAST SURGICAL HISTORY:   Past Surgical History:  Procedure Laterality Date   ABDOMINAL HYSTERECTOMY     BREAST BIOPSY Right 01/19/2013   stereo biopsy of two areas neg   CHOLECYSTECTOMY N/A 03/28/2019   Procedure: LAPAROSCOPIC CHOLECYSTECTOMY;  Surgeon: Desiderio Schanz, MD;  Location: [facility] ORS;  Service: General;  Laterality: N/A;   JOINT REPLACEMENT     lt knee   KNEE ARTHROPLASTY Right 04/27/2015   Procedure: COMPUTER ASSISTED TOTAL KNEE ARTHROPLASTY;  Surgeon: Lynwood SHAUNNA Hue, MD;  Location: [facility] ORS;  Service: Orthopedics;  Laterality: Right;   LEFT HEART CATH AND CORONARY ANGIOGRAPHY N/A 04/10/2017   Procedure: LEFT HEART CATH AND CORONARY ANGIOGRAPHY;  Surgeon: Bosie Vinie LABOR, MD;  Location: [facility];  Service: Cardiovascular;  Laterality: N/A;   LEFT HEART CATH AND CORONARY ANGIOGRAPHY N/A 07/10/2023   Procedure: LEFT HEART CATH AND CORONARY ANGIOGRAPHY;  Surgeon: Katina Albright,  MD;  Location: [facility];  Service: Cardiovascular;  Laterality: N/A;   SHOULDER ARTHROSCOPY Left    THYROID  SURGERY Bilateral    TUMOR REMOVAL Right    neck    SOCIAL HISTORY:   Social History   Tobacco Use   Smoking status: Never   Smokeless tobacco: Never  Substance Use Topics   Alcohol use: No    FAMILY HISTORY:   Family History  Problem Relation Age of Onset   Breast cancer Sister     DRUG ALLERGIES:   Allergies  Allergen Reactions   Oxycodone -Acetaminophen  Hives, Rash, Shortness Of Breath and Swelling   Strawberry  Flavoring Agent (Non-Screening) Shortness Of Breath and Rash   Aloe Vera Hives   Amlodipine    Hydrocodone Hives   Latex     NEGATIVE BY IgE (<0.10)   Morphine  And Codeine Other (See Comments)    Nausea delusion    Penicillins Hives    Has patient had a PCN reaction causing immediate rash, facial/tongue/throat swelling, SOB or lightheadedness with hypotension: Yes Has patient had a PCN reaction causing severe rash involving mucus membranes or skin necrosis: No Has patient had a PCN reaction that required hospitalization: No Has patient had a PCN reaction occurring within the last 10 years: No If all of the above answers are NO, then may proceed with Cephalosporin use.   Meperidine Rash    REVIEW OF SYSTEMS:   ROS As per history of present illness. All pertinent systems were reviewed above. Constitutional, HEENT, cardiovascular, respiratory, GI, GU, musculoskeletal, neuro, psychiatric, endocrine, integumentary and hematologic systems were reviewed and are otherwise negative/unremarkable except for positive findings mentioned above in the HPI.   MEDICATIONS AT HOME:   Prior to Admission medications   Medication Sig Start Date End Date Taking? Authorizing Provider  allopurinol  (ZYLOPRIM ) 300 MG tablet Take 300 mg by mouth daily as needed. 03/11/17   [provider]  aspirin  EC 81 MG tablet Take 81 mg by mouth daily.    [provider]  colchicine  0.6 MG tablet Take 0.6 mg by mouth as needed.     [provider]  ergocalciferol (VITAMIN D2) 1.25 MG (50000 UT) capsule Take by mouth. Patient not taking: Reported on 07/09/2023 12/26/20   [provider]  gabapentin  (NEURONTIN ) 300 MG capsule Take 300 mg by mouth 2 (two) times daily. 09/05/21   [provider]  isosorbide  mononitrate (IMDUR ) 30 MG 24 hr tablet Take 30 mg by mouth daily.    [provider]  levothyroxine  (SYNTHROID ) 125 MCG tablet Take 125 mcg by mouth daily before  breakfast.     [provider]  losartan  (COZAAR ) 50 MG tablet Hold until followup with outpatient doctor since cardiologist started you on Aldactone . Patient taking differently: Take 50 mg by mouth daily. Hold until followup with outpatient doctor since cardiologist started you on Aldactone . 04/12/20   Awanda City, MD  metoprolol  tartrate (LOPRESSOR ) 25 MG tablet Take 12.5 mg by mouth 2 (two) times daily.    [provider]  rosuvastatin (CRESTOR) 10 MG tablet Take 10 mg by mouth daily. 06/25/23   [provider]  tiZANidine  (ZANAFLEX ) 4 MG tablet Take 1 tablet (4 mg total) by mouth every 8 (eight) hours as needed for muscle spasms. 06/03/23 06/02/24  Ulis Bottcher, PA-C  torsemide  (DEMADEX ) 10 MG tablet Take 10 mg by mouth daily as needed.    [provider]  traMADol  (ULTRAM ) 50 MG tablet Take 1-2 tablets (50-100  mg total) by mouth every 4 (four) hours as needed for moderate pain. 04/28/15   Waddell Thom SAUNDERS, PA      VITAL SIGNS:  Blood pressure (!) 181/95, pulse (!) 48, temperature 97.9 F (36.6 C), temperature source Oral, resp. rate 13, height 5' 10 (1.778 m), weight 121.1 kg, SpO2 100%.  PHYSICAL EXAMINATION:  Physical Exam  GENERAL:  78 y.o.-year-old Caucasian female patient lying in the bed with no acute distress.  EYES: Pupils equal, round, reactive to light and accommodation. No scleral icterus. Extraocular muscles intact.  HEENT: Head atraumatic, normocephalic. Oropharynx and nasopharynx clear.  NECK:  Supple, no jugular venous distention. No thyroid  enlargement, no tenderness.  LUNGS: Normal breath sounds bilaterally, no wheezing, rales,rhonchi or crepitation. No use of accessory muscles of respiration.  CARDIOVASCULAR: Regular rate and rhythm, S1, S2 normal. No murmurs, rubs, or gallops.  ABDOMEN: Soft, nondistended, nontender. Bowel sounds present. No organomegaly or mass.  EXTREMITIES: No pedal edema, cyanosis, or clubbing.  NEUROLOGIC: Cranial  nerves II through XII are intact. Muscle strength 5/5 in all extremities. Sensation intact. Gait not checked.  PSYCHIATRIC: The patient is alert and oriented x 3.  Normal affect and good eye contact. SKIN: No obvious rash, lesion, or ulcer.   LABORATORY PANEL:   CBC Recent Labs  Lab 11/13/23 0553  WBC 7.8  HGB 13.6  HCT 41.7  PLT 142*   ------------------------------------------------------------------------------------------------------------------  Chemistries  Recent Labs  Lab 11/12/23 2036 11/13/23 0031 11/13/23 0553  NA 137  --  139  K 3.2*  --  3.9  CL 99  --  103  CO2 25  --  26  GLUCOSE 187*  --  102*  BUN 16  --  14  CREATININE 1.49*  --  1.10*  CALCIUM  9.1  --  8.8*  MG  --  2.0  --   AST 37  --   --   ALT 21  --   --   ALKPHOS 78  --   --   BILITOT 0.7  --   --    ------------------------------------------------------------------------------------------------------------------  Cardiac Enzymes No results for input(s): TROPONINI in the last 168 hours. ------------------------------------------------------------------------------------------------------------------  RADIOLOGY:  CT ABDOMEN PELVIS W CONTRAST Result Date: 11/13/2023 CLINICAL DATA:  Black tarry stools EXAM: CT ABDOMEN AND PELVIS WITH CONTRAST TECHNIQUE: Multidetector CT imaging of the abdomen and pelvis was performed using the standard protocol following bolus administration of intravenous contrast. RADIATION DOSE REDUCTION: This exam was performed according to the departmental dose-optimization program which includes automated exposure control, adjustment of the mA and/or kV according to patient size and/or use of iterative reconstruction technique. CONTRAST:  75mL OMNIPAQUE  IOHEXOL  300 MG/ML  SOLN COMPARISON:  08/06/2023 FINDINGS: Lower chest: Lung bases are clear.  Changes of prior TAVR are noted. Hepatobiliary: No focal liver abnormality is seen. Status post cholecystectomy. No biliary  dilatation. Pancreas: Unremarkable. No pancreatic ductal dilatation or surrounding inflammatory changes. Spleen: Normal in size without focal abnormality. Adrenals/Urinary Tract: Adrenal glands are within normal limits. Kidneys are well visualized bilaterally. No renal calculi or obstructive changes are seen. The bladder is decompressed. Stomach/Bowel: Scattered diverticular change of the colon is noted without evidence of diverticulitis. The appendix is within normal limits. Small bowel and stomach are within normal limits with the exception of a small sliding-type hiatal hernia. Vascular/Lymphatic: Aortic atherosclerosis. No enlarged abdominal or pelvic lymph nodes. Reproductive: Status post hysterectomy. No adnexal masses. Other: No abdominal wall hernia or abnormality. No abdominopelvic ascites. Musculoskeletal: No acute or  significant osseous findings. IMPRESSION: Diverticulosis without diverticulitis. Changes of hiatal hernia. Electronically Signed   By: Oneil Devonshire M.D.   On: 11/13/2023 01:28   DG Chest 2 View Result Date: 11/12/2023 EXAM: 2 VIEW(S) XRAY OF THE CHEST 11/12/2023 08:55:05 PM COMPARISON: 07/22/2023 CLINICAL HISTORY: cp. Near syncope, chest pain, pain travels up left side of neck. Recent heart valve replacement x 4 weeks ago FINDINGS: LUNGS AND PLEURA: No focal pulmonary opacity. No pulmonary edema. No pleural effusion. No pneumothorax. HEART AND MEDIASTINUM: Aortic atherosclerosis. Transcatheter aortic valve replacement noted. BONES AND SOFT TISSUES: Multilevel thoracic osteophytosis. IMPRESSION: 1. No acute process. Electronically signed by: Norman Gatlin MD 11/12/2023 09:13 PM EDT RP Workstation: [id number]      IMPRESSION AND PLAN:  Assessment and Plan: * Near syncope - This was associated with what sounds like melena however the patient had guaiac negative stools.  Her hemoglobin and hematocrits are normal without significant drop. - She will be admitted to observation medical  telemetry bed. - The patient will be admitted to an observation medical telemetry bed. - Will check orthostatics q 12 hours.  Will obtain repeat stool Hemoccult X.3 and defer GI consult for positive result especially given stable hemoglobin. - The patient will be gently hydrated with IV normal saline and monitored for arrhythmias. - Will follow serial hemoglobins and hematocrits. -Differential diagnoses would include neurally mediated syncope, cardiogenic, arrhythmias related,  orthostatic hypotension and less likely hypoglycemia.    AKI (acute kidney injury) - This could be related to volume depletion and dehydration and possible GI bleeding. - She will be hydrated with IV normal saline and will follow BMP. - Will avoid nephrotoxins.  Hypokalemia Potassium will be placed and magnesium  level rechecked.  Dyslipidemia - Continue statin therapy.  Coronary artery disease -Will continue Imdur  and statin therapy and hold off aspirin .  Hypothyroidism - Will continue Synthroid  and check TSH level.      DVT prophylaxis: SCDs.  Medical prophylaxis is held off given the possibility of GI bleeding. Advanced Care Planning:  Code Status: full code. Family Communication:  The plan of care was discussed in details with the patient (and family). I answered all questions. The patient agreed to proceed with the above mentioned plan. Further management will depend upon hospital course. Disposition Plan: Back to previous home environment Consults called: none. All the records are reviewed and case discussed with ED provider.  Status is: Observation  I certify that at the time of admission, it is my clinical judgment that the patient will require hospital care extending less than 2 midnights.                            Dispo: The patient is from: Home              Anticipated d/c is to: Home              Patient currently is not medically stable to d/c.              Difficult to place patient:  No  [place] DELENA Peaches M.D on 11/13/2023 at 6:56 AM  [facility]   From 7 PM-7 AM, contact night-coverage [email or web address]  CC: Primary care physician; Auston Reyes BIRCH, MD

## 2023-11-13 NOTE — Assessment & Plan Note (Addendum)
 Continue Imdur  and statin therapy and hold off aspirin .

## 2023-11-13 NOTE — Plan of Care (Signed)
   Problem: Education: Goal: Knowledge of condition and prescribed therapy will improve Outcome: Progressing   Problem: Cardiac: Goal: Will achieve and/or maintain adequate cardiac output Outcome: Progressing   Problem: Physical Regulation: Goal: Complications related to the disease process, condition or treatment will be avoided or minimized Outcome: Progressing

## 2023-11-13 NOTE — Consult Note (Signed)
 Rogelia Copping, MD [facility]  [address], [address] [place], [place] [address] Phone: [phone] Fax : [phone]  Consultation  Referring Provider:     Dr. Josette Primary Care Physician:  Auston Reyes BIRCH, MD Primary Gastroenterologist: Sampson         Reason for Consultation:     Upper GI bleed  Date of Admission:  11/13/2023 Date of Consultation:  11/13/2023        HPI:   Brianna Dalton is a 78 y.o. female who presented to the emergency room with acute onset of melena and presyncope.  The patient has a history of coronary artery disease, hypertension and aortic stenosis status post TAVR.  The patient also has a history of hypothyroidism.  The patient had reported to the emergency department that she had a large amount of liquid stools that were black. She was reported to have later felt lightheaded when she got up.  The patient was admitted with a hemoglobin of 14.0 that this morning was 13.6.   The patient reports that she had black stools twice yesterday and they were 1 right after another and then another 1 since then but nothing today.  She denies any abdominal pain.  The patient reports that she had a valvular surgery with repair at [facility] 5 weeks ago.  She denies taking any Advil  Aleve Motrin  BC's Goody powders or any other anti-inflammatories.  She denies any abdominal pain at the present time. The patient denies ever seeing a gastroenterologist in the past.  She denies having a colonoscopy or EGD.  She does report that she has had more loose bowel movements over the last month.  Past Medical History:  Diagnosis Date   Angina pectoris syndrome    Arterial hemorrhage    Lt ankle possible venous bleed   Arthritis    Coronary artery disease    Hypertension    Hypothyroidism    Neuropathy    Varicose vein of leg     Past Surgical History:  Procedure Laterality Date   ABDOMINAL HYSTERECTOMY     BREAST BIOPSY Right 01/19/2013   stereo biopsy of two areas neg    CHOLECYSTECTOMY N/A 03/28/2019   Procedure: LAPAROSCOPIC CHOLECYSTECTOMY;  Surgeon: Desiderio Schanz, MD;  Location: [facility] ORS;  Service: General;  Laterality: N/A;   JOINT REPLACEMENT     lt knee   KNEE ARTHROPLASTY Right 04/27/2015   Procedure: COMPUTER ASSISTED TOTAL KNEE ARTHROPLASTY;  Surgeon: Lynwood SHAUNNA Hue, MD;  Location: [facility] ORS;  Service: Orthopedics;  Laterality: Right;   LEFT HEART CATH AND CORONARY ANGIOGRAPHY N/A 04/10/2017   Procedure: LEFT HEART CATH AND CORONARY ANGIOGRAPHY;  Surgeon: Bosie Vinie LABOR, MD;  Location: [facility] INVASIVE CV LAB;  Service: Cardiovascular;  Laterality: N/A;   LEFT HEART CATH AND CORONARY ANGIOGRAPHY N/A 07/10/2023   Procedure: LEFT HEART CATH AND CORONARY ANGIOGRAPHY;  Surgeon: Katina Albright, MD;  Location: [facility] INVASIVE CV LAB;  Service: Cardiovascular;  Laterality: N/A;   SHOULDER ARTHROSCOPY Left    THYROID  SURGERY Bilateral    TUMOR REMOVAL Right    neck    Prior to Admission medications   Medication Sig Start Date End Date Taking? Authorizing Provider  aspirin  EC 81 MG tablet Take 81 mg by mouth daily.   Yes [provider]  baclofen  (LIORESAL ) 10 MG tablet Take 5 mg by mouth 2 (two) times daily. 07/24/23  Yes [provider]  isosorbide  mononitrate (IMDUR ) 30 MG 24 hr tablet Take  30 mg by mouth daily.   Yes [provider]  losartan  (COZAAR ) 50 MG tablet Hold until followup with outpatient doctor since cardiologist started you on Aldactone . Patient taking differently: Take 50 mg by mouth daily. Hold until followup with outpatient doctor since cardiologist started you on Aldactone . 04/12/20  Yes Awanda City, MD  oxyCODONE  (OXY IR/ROXICODONE ) 5 MG immediate release tablet Take 5-10 mg by mouth every 6 (six) hours as needed. 09/18/23  Yes [provider]  rosuvastatin (CRESTOR) 10 MG tablet Take 10 mg by mouth daily. 06/25/23  Yes [provider]  torsemide  (DEMADEX ) 10 MG tablet Take 10 mg by mouth daily as needed.   Yes  [provider]  allopurinol  (ZYLOPRIM ) 300 MG tablet Take 300 mg by mouth daily as needed. Patient not taking: Reported on 11/13/2023 03/11/17   [provider]  colchicine  0.6 MG tablet Take 0.6 mg by mouth as needed.  Patient not taking: Reported on 11/13/2023    [provider]  ergocalciferol (VITAMIN D2) 1.25 MG (50000 UT) capsule Take by mouth. Patient not taking: Reported on 07/09/2023 12/26/20   [provider]  gabapentin  (NEURONTIN ) 300 MG capsule Take 300 mg by mouth 2 (two) times daily. Patient not taking: Reported on 11/13/2023 09/05/21   [provider]  levothyroxine  (SYNTHROID ) 125 MCG tablet Take 125 mcg by mouth daily before breakfast.  Patient not taking: Reported on 11/13/2023    [provider]  metoprolol  tartrate (LOPRESSOR ) 25 MG tablet Take 12.5 mg by mouth 2 (two) times daily. Patient not taking: Reported on 11/13/2023    [provider]  tiZANidine  (ZANAFLEX ) 4 MG tablet Take 1 tablet (4 mg total) by mouth every 8 (eight) hours as needed for muscle spasms. Patient not taking: Reported on 11/13/2023 06/03/23 06/02/24  Ulis Bottcher, PA-C  traMADol  (ULTRAM ) 50 MG tablet Take 1-2 tablets (50-100 mg total) by mouth every 4 (four) hours as needed for moderate pain. Patient not taking: Reported on 11/13/2023 04/28/15   Waddell Thom SAUNDERS, PA    Family History  Problem Relation Age of Onset   Breast cancer Sister      Social History   Tobacco Use   Smoking status: Never   Smokeless tobacco: Never  Substance Use Topics   Alcohol use: No   Drug use: No    Allergies as of 11/12/2023 - Review Complete 11/12/2023  Allergen Reaction Noted   Oxycodone -acetaminophen  Hives, Rash, Shortness Of Breath, and Swelling 07/05/2013   Strawberry flavoring agent (non-screening) Shortness Of Breath and Rash 03/27/2019   Aloe vera Hives 07/05/2013   Amlodipine  04/09/2017   Hydrocodone Hives 04/14/2015   Latex  04/14/2015    Morphine  and codeine Other (See Comments) 04/14/2015   Penicillins Hives 04/14/2015   Meperidine Rash 07/05/2013    Review of Systems:    All systems reviewed and negative except where noted in HPI.   Physical Exam:  Vital signs in last 24 hours: Temp:  [97.7 F (36.5 C)-98.7 F (37.1 C)] 98.4 F (36.9 C) (10/22 1223) Pulse Rate:  [45-63] 63 (10/22 1225) Resp:  [12-19] 18 (10/22 1223) BP: (121-181)/(63-104) 162/95 (10/22 1225) SpO2:  [100 %] 100 % (10/22 1030) Weight:  [121.1 kg] 121.1 kg (10/21 2033) Last BM Date : 11/13/23 General:   Pleasant, cooperative in NAD Head:  Normocephalic and atraumatic. Eyes:   No icterus.   Conjunctiva pink. PERRLA. Ears:  Normal auditory acuity. Neck:  Supple; no masses or thyroidomegaly Lungs: Respirations even  and unlabored. Lungs clear to auscultation bilaterally.   No wheezes, crackles, or rhonchi.  Heart:  Regular rate and rhythm; positive murmur, clicks, rubs or gallops Abdomen:  Soft, nondistended, nontender. Normal bowel sounds. No appreciable masses or hepatomegaly.  No rebound or guarding.  Rectal:  Not performed. Msk:  Symmetrical without gross deformities.    Extremities:  Without edema, cyanosis or clubbing. Neurologic:  Alert and oriented x3;  grossly normal neurologically. Skin:  Intact without significant lesions or rashes. Cervical Nodes:  No significant cervical adenopathy. Psych:  Alert and cooperative. Normal affect.  LAB RESULTS: Recent Labs    11/12/23 2036 11/13/23 0553 11/13/23 1021  WBC 7.6 7.8  --   HGB 14.0 13.6 13.0  HCT 43.0 41.7 40.2  PLT 149* 142*  --    BMET Recent Labs    11/12/23 2036 11/13/23 0553  NA 137 139  K 3.2* 3.9  CL 99 103  CO2 25 26  GLUCOSE 187* 102*  BUN 16 14  CREATININE 1.49* 1.10*  CALCIUM  9.1 8.8*   LFT Recent Labs    11/12/23 2036  PROT 6.6  ALBUMIN 4.1  AST 37  ALT 21  ALKPHOS 78  BILITOT 0.7   PT/INR No results for input(s): LABPROT, INR in the last 72  hours.  STUDIES: ECHOCARDIOGRAM COMPLETE Result Date: 11/13/2023    ECHOCARDIOGRAM REPORT   Patient Name:   Brianna Dalton Date of Exam: 11/13/2023 Medical Rec #:  [id number]       Height:       70.0 in Accession #:    [id number]      Weight:       267.0 lb Date of Birth:  07-17-1945       BSA:          2.360 m Patient Age:    78 years        BP:           136/81 mmHg Patient Gender: F               HR:           54 bpm. Exam Location:  [facility] Procedure: 2D Echo, Color Doppler, Cardiac Doppler and Intracardiac            Opacification Agent (Both Spectral and Color Flow Doppler were            utilized during procedure). Indications:     Syncope R55  History:         Patient has prior history of Echocardiogram examinations, most                  recent 07/09/2023. Aortic Valve Disease. S/p TAVR in Sept 2025.  Sonographer:     Ashley McNeely-Sloane Referring Phys:  [id number] JAN A MANSY Diagnosing Phys: Keller Alluri IMPRESSIONS  1. Technically difficult study, definity  used.  2. Left ventricular ejection fraction, by estimation, is 65 to 70%. The left ventricle has normal function. The left ventricle has no regional wall motion abnormalities. There is mild left ventricular hypertrophy. Left ventricular diastolic parameters are indeterminate.  3. Right ventricular systolic function is normal. The right ventricular size is normal.  4. The mitral valve was not well visualized. Trivial mitral valve regurgitation.  5. TAVR appears well seated without any significant dysfunction. Peak velocity 2.8 m/s, mean gradient 16 mmHg, Dimensionless index 0.41. Aortic valve regurgitation is not visualized. FINDINGS  Left Ventricle: Left ventricular ejection fraction, by estimation, is 65 to  70%. The left ventricle has normal function. The left ventricle has no regional wall motion abnormalities. Definity  contrast agent was given IV to delineate the left ventricular  endocardial borders. The left ventricular internal cavity size was  normal in size. There is mild left ventricular hypertrophy. Left ventricular diastolic parameters are indeterminate. Right Ventricle: The right ventricular size is normal. No increase in right ventricular wall thickness. Right ventricular systolic function is normal. Left Atrium: Left atrial size was not well visualized. Right Atrium: Right atrial size was not well visualized. Pericardium: There is no evidence of pericardial effusion. Mitral Valve: The mitral valve was not well visualized. Trivial mitral valve regurgitation. MV peak gradient, 4.5 mmHg. The mean mitral valve gradient is 2.0 mmHg. Tricuspid Valve: The tricuspid valve is not well visualized. Tricuspid valve regurgitation is trivial. Aortic Valve: TAVR appears well seated without any significant dysfunction. Peak velocity 2.8 m/s, mean gradient 16 mmHg, Dimensionless index 0.41. Aortic valve regurgitation is not visualized. Aortic valve mean gradient measures 16.0 mmHg. Aortic valve peak gradient measures 30.5 mmHg. Aortic valve area, by VTI measures 1.42 cm. Pulmonic Valve: The pulmonic valve was not well visualized. Pulmonic valve regurgitation is not visualized. Aorta: The aortic root was not well visualized. IAS/Shunts: The interatrial septum was not well visualized.  LEFT VENTRICLE PLAX 2D LVIDd:         5.00 cm      Diastology LVIDs:         2.30 cm      LV e' medial:    5.11 cm/s LV PW:         1.50 cm      LV E/e' medial:  13.5 LV IVS:        1.60 cm      LV e' lateral:   7.62 cm/s LVOT diam:     2.10 cm      LV E/e' lateral: 9.1 LV SV:         95 LV SV Index:   40 LVOT Area:     3.46 cm  LV Volumes (MOD) LV vol d, MOD A2C: 110.0 ml LV vol d, MOD A4C: 134.0 ml LV vol s, MOD A2C: 18.6 ml LV vol s, MOD A4C: 31.2 ml LV SV MOD A2C:     91.4 ml LV SV MOD A4C:     134.0 ml LV SV MOD BP:      102.9 ml RIGHT VENTRICLE RV Basal diam:  4.20 cm RV Mid diam:    4.00 cm RV S prime:     15.10 cm/s TAPSE (M-mode): 3.9 cm LEFT ATRIUM             Index         RIGHT ATRIUM           Index LA Vol (A2C):   77.2 ml 32.71 ml/m  RA Area:     20.00 cm LA Vol (A4C):   70.8 ml 30.00 ml/m  RA Volume:   53.20 ml  22.54 ml/m LA Biplane Vol: 75.1 ml 31.82 ml/m  AORTIC VALVE                     PULMONIC VALVE AV Area (Vmax):    1.41 cm      PV Vmax:       0.65 m/s AV Area (Vmean):   1.57 cm      PV Vmean:      43.400 cm/s AV Area (VTI):  1.42 cm      PV VTI:        0.154 m AV Vmax:           276.00 cm/s   PV Peak grad:  1.7 mmHg AV Vmean:          187.000 cm/s  PV Mean grad:  1.0 mmHg AV VTI:            0.668 m AV Peak Grad:      30.5 mmHg AV Mean Grad:      16.0 mmHg LVOT Vmax:         112.00 cm/s LVOT Vmean:        84.500 cm/s LVOT VTI:          0.273 m LVOT/AV VTI ratio: 0.41  AORTA Ao Root diam: 4.00 cm MITRAL VALVE MV Area (PHT): 2.45 cm     SHUNTS MV Area VTI:   2.47 cm     Systemic VTI:  0.27 m MV Peak grad:  4.5 mmHg     Systemic Diam: 2.10 cm MV Mean grad:  2.0 mmHg MV Vmax:       1.06 m/s MV Vmean:      60.2 cm/s MV Decel Time: 310 msec MV E velocity: 69.00 cm/s MV A velocity: 106.00 cm/s MV E/A ratio:  0.65 Keller Paterson Electronically signed by Keller Paterson Signature Date/Time: 11/13/2023/12:09:10 PM    Final    CT ABDOMEN PELVIS W CONTRAST Result Date: 11/13/2023 CLINICAL DATA:  Black tarry stools EXAM: CT ABDOMEN AND PELVIS WITH CONTRAST TECHNIQUE: Multidetector CT imaging of the abdomen and pelvis was performed using the standard protocol following bolus administration of intravenous contrast. RADIATION DOSE REDUCTION: This exam was performed according to the departmental dose-optimization program which includes automated exposure control, adjustment of the mA and/or kV according to patient size and/or use of iterative reconstruction technique. CONTRAST:  75mL OMNIPAQUE  IOHEXOL  300 MG/ML  SOLN COMPARISON:  08/06/2023 FINDINGS: Lower chest: Lung bases are clear.  Changes of prior TAVR are noted. Hepatobiliary: No focal liver abnormality is seen.  Status post cholecystectomy. No biliary dilatation. Pancreas: Unremarkable. No pancreatic ductal dilatation or surrounding inflammatory changes. Spleen: Normal in size without focal abnormality. Adrenals/Urinary Tract: Adrenal glands are within normal limits. Kidneys are well visualized bilaterally. No renal calculi or obstructive changes are seen. The bladder is decompressed. Stomach/Bowel: Scattered diverticular change of the colon is noted without evidence of diverticulitis. The appendix is within normal limits. Small bowel and stomach are within normal limits with the exception of a small sliding-type hiatal hernia. Vascular/Lymphatic: Aortic atherosclerosis. No enlarged abdominal or pelvic lymph nodes. Reproductive: Status post hysterectomy. No adnexal masses. Other: No abdominal wall hernia or abnormality. No abdominopelvic ascites. Musculoskeletal: No acute or significant osseous findings. IMPRESSION: Diverticulosis without diverticulitis. Changes of hiatal hernia. Electronically Signed   By: Oneil Devonshire M.D.   On: 11/13/2023 01:28   DG Chest 2 View Result Date: 11/12/2023 EXAM: 2 VIEW(S) XRAY OF THE CHEST 11/12/2023 08:55:05 PM COMPARISON: 07/22/2023 CLINICAL HISTORY: cp. Near syncope, chest pain, pain travels up left side of neck. Recent heart valve replacement x 4 weeks ago FINDINGS: LUNGS AND PLEURA: No focal pulmonary opacity. No pulmonary edema. No pleural effusion. No pneumothorax. HEART AND MEDIASTINUM: Aortic atherosclerosis. Transcatheter aortic valve replacement noted. BONES AND SOFT TISSUES: Multilevel thoracic osteophytosis. IMPRESSION: 1. No acute process. Electronically signed by: Norman Gatlin MD 11/12/2023 09:13 PM EDT RP Workstation: [id number]      Impression / Plan:  Assessment: Principal Problem:   Near syncope Active Problems:   AKI (acute kidney injury)   Hypokalemia   Hypothyroidism   Coronary artery disease   Dyslipidemia   GRISEL BLUMENSTOCK is a 78 y.o. y/o  female with with melena prior to admission.  The patient had eaten this morning therefore an upper endoscopy will not be planned for today.  The patient's hemoglobin has been stable and there is no need for urgent intervention at this time.   Plan:  The patient will be set up for an upper endoscopy for tomorrow.  The patient has been explained the procedure and agrees to proceeding with the procedure.  It is likely the patient had a GI bleed that has stopped since she has had no further signs of black stools and her hemoglobin have been stable.  The patient has been explained the plan and agrees with it.  Thank you for involving me in the care of this patient.      LOS: 0 days   Rogelia Copping, MD, MD. Brianna Dalton 11/13/2023, 12:57 PM,  Pager [phone] 7am-5pm  Check AMION for 5pm -7am coverage and on weekends   Note: This dictation was prepared with Dragon dictation along with smaller phrase technology. Any transcriptional errors that result from this process are unintentional.

## 2023-11-13 NOTE — Assessment & Plan Note (Addendum)
 Will receive K-Phos today

## 2023-11-13 NOTE — Assessment & Plan Note (Addendum)
 Patient states that she has been taking her thyroid  medication.  TSH 221 and free T4 less than 0.25.  Case discussed with pharmacist and will increase her levothyroxine  to 175 mcg daily.  Recommend checking a thyroid  function tests in about 6 weeks.  Likely will have to increase levothyroxine  again.

## 2023-11-13 NOTE — ED Provider Notes (Signed)
 [facility] Provider Note    Event Date/Time   First MD Initiated Contact with Patient 11/13/23 0013     (approximate)   History   Near Syncope and Rectal Bleeding   HPI  Brianna Dalton is a 78 y.o. female   Past medical history of CAD, hypertension, severe aortic stenosis status post TAVR on antiplatelets here with dark stools.  Was in her regular state of health today went out for dinner and had a sudden urge to defecate, ran to the bathroom and had a large mixed solid/liquid black stool.  After she got up she felt lightheaded and briefly short of breath.  She was concerned given the dark quality of stool so came to the emergency department.  Denies chest pain, but does have mild abdominal pain diffusely.  Did not vomit does not feel nauseated.  Does not take blood thinner.  Does not drink alcohol.  Independent Historian contributed to assessment above: Family member at bedside corroborates information given above  External Medical Documents Reviewed: Discharge summary from TAVR in August      Physical Exam   Triage Vital Signs: ED Triage Vitals  Encounter Vitals Group     BP 11/12/23 2034 121/76     Girls Systolic BP Percentile --      Girls Diastolic BP Percentile --      Boys Systolic BP Percentile --      Boys Diastolic BP Percentile --      Pulse Rate 11/12/23 2034 63     Resp 11/12/23 2034 18     Temp 11/12/23 2034 98.7 F (37.1 C)     Temp src --      SpO2 11/12/23 2034 100 %     Weight 11/12/23 2033 267 lb (121.1 kg)     Height 11/12/23 2033 5' 10 (1.778 m)     Head Circumference --      Peak Flow --      Pain Score 11/12/23 2033 8     Pain Loc --      Pain Education --      Exclude from Growth Chart --     Most recent vital signs: Vitals:   11/13/23 0035 11/13/23 0100  BP: (!) 150/86 139/68  Pulse: (!) 48 (!) 57  Resp: 15 17  Temp: 98.6 F (37 C) 98.6 F (37 C)  SpO2: 100% 100%    General: Awake, no distress.   CV:  Good peripheral perfusion.  Resp:  Normal effort.  Abd:  No distention.  Other:  Mostly empty rectal vault on rectal exam, yellow mucousy stool guaiac negative.  Mild diffuse tenderness in the abdomen without rigidity or guarding.  Slightly hypertensive otherwise vital signs unremarkable.   ED Results / Procedures / Treatments   Labs (all labs ordered are listed, but only abnormal results are displayed) Labs Reviewed  CBC - Abnormal; Notable for the following components:      Result Value   Platelets 149 (*)    All other components within normal limits  COMPREHENSIVE METABOLIC PANEL WITH GFR - Abnormal; Notable for the following components:   Potassium 3.2 (*)    Glucose, Bld 187 (*)    Creatinine, Ser 1.49 (*)    GFR, Estimated 36 (*)    All other components within normal limits  MAGNESIUM   TSH  T4, FREE  TROPONIN I (HIGH SENSITIVITY)  TROPONIN I (HIGH SENSITIVITY)     I ordered and reviewed the above  labs they are notable for hemoglobin is 14.  Creatinine is elevated at 1.49 with a GFR of 36, worse than prior testing  EKG  ED ECG REPORT I, Ginnie Shams, the attending physician, personally viewed and interpreted this ECG.   Date: 11/13/2023  EKG Time: 2035  Rate: 61  Rhythm: sinus  Axis: nl  Intervals: Computer reading as long QTc though likely artifact from poor baseline, repeat ordered -better baseline on repeat, QTc is quite prolonged 500+  ST&T Change: No STEMI    RADIOLOGY I independently reviewed and interpreted CT abdomen and pelvis to see no obvious obstructive or inflammatory changes I also reviewed radiologist's formal read.   PROCEDURES:  Critical Care performed: No  Procedures   MEDICATIONS ORDERED IN ED: Medications  pantoprazole  (PROTONIX ) injection 40 mg (40 mg Intravenous Given 11/13/23 0056)  sodium chloride  0.9 % bolus 1,000 mL (0 mLs Intravenous Stopped 11/13/23 0215)  iohexol  (OMNIPAQUE ) 300 MG/ML solution 75 mL (75 mLs  Intravenous Contrast Given 11/13/23 0111)  potassium chloride SA (KLOR-CON M) CR tablet 40 mEq (40 mEq Oral Given 11/13/23 0149)  acetaminophen  (TYLENOL ) tablet 1,000 mg (1,000 mg Oral Given 11/13/23 0149)    External physician / consultants:  I spoke with hospital medicine for admission and regarding care plan for this patient.   IMPRESSION / MDM / ASSESSMENT AND PLAN / ED COURSE  I reviewed the triage vital signs and the nursing notes.                                Patient's presentation is most consistent with acute presentation with potential threat to life or bodily function.  Differential diagnosis includes, but is not limited to, upper GI bleeding, acute blood loss anemia, presyncope due to blood loss, vasovagal, dysrhythmia, ACS, intra-abdominal infection or obstruction   The patient is on the cardiac monitor to evaluate for evidence of arrhythmia and/or significant heart rate changes.  MDM:    In terms of her dark stools there is no evidence of melena on my exam guaiac negative yellow stools, and a normal H&H, GPS score for upper GI bleeding is negative however with her presyncopal symptoms though could be vasovagal in the setting of her sudden urge to defecate, she does have abnormalities including slight hypokalemia and a significantly prolonged QTc.  She has an AKI, given fluids.  She was put on cardiac monitoring.  Troponins checked for ACS, flat and stable and without chest pain doubt cardiac ischemia.    -- Patient stable without any more bowel movements or episodes of palpitations/lightheadedness.  Workup significant for normal CT, normal and stable trops, but a mild AKI, mild hypokalemia, and a prolonged QTc interval on EKG.  Given these findings and this 78 year old patient with near syncope and palpitations, I think it important that she stay in the hospital for further cardiac monitoring, repletion of electrolytes.        FINAL CLINICAL IMPRESSION(S) / ED  DIAGNOSES   Final diagnoses:  QT prolongation  Palpitations  Near syncope  Diarrhea, unspecified type  AKI (acute kidney injury)  Dark stools     Rx / DC Orders   ED Discharge Orders     None        Note:  This document was prepared using Dragon voice recognition software and may include unintentional dictation errors.    Shams Ginnie, MD 11/13/23 [phone]

## 2023-11-13 NOTE — Assessment & Plan Note (Addendum)
 Continue statin therapy

## 2023-11-14 ENCOUNTER — Observation Stay

## 2023-11-14 ENCOUNTER — Encounter: Payer: Self-pay | Admitting: Family Medicine

## 2023-11-14 ENCOUNTER — Encounter: Admission: EM | Disposition: A | Payer: Self-pay | Source: Home / Self Care | Attending: Emergency Medicine

## 2023-11-14 DIAGNOSIS — N179 Acute kidney failure, unspecified: Secondary | ICD-10-CM | POA: Diagnosis not present

## 2023-11-14 DIAGNOSIS — E876 Hypokalemia: Secondary | ICD-10-CM | POA: Diagnosis not present

## 2023-11-14 DIAGNOSIS — K2980 Duodenitis without bleeding: Secondary | ICD-10-CM | POA: Diagnosis not present

## 2023-11-14 DIAGNOSIS — D696 Thrombocytopenia, unspecified: Secondary | ICD-10-CM | POA: Insufficient documentation

## 2023-11-14 DIAGNOSIS — K921 Melena: Secondary | ICD-10-CM | POA: Diagnosis not present

## 2023-11-14 DIAGNOSIS — R9431 Abnormal electrocardiogram [ECG] [EKG]: Secondary | ICD-10-CM | POA: Diagnosis not present

## 2023-11-14 HISTORY — PX: ESOPHAGOGASTRODUODENOSCOPY: SHX5428

## 2023-11-14 LAB — CBC
HCT: 39.4 % (ref 36.0–46.0)
Hemoglobin: 12.7 g/dL (ref 12.0–15.0)
MCH: 29.3 pg (ref 26.0–34.0)
MCHC: 32.2 g/dL (ref 30.0–36.0)
MCV: 90.8 fL (ref 80.0–100.0)
Platelets: 123 K/uL — ABNORMAL LOW (ref 150–400)
RBC: 4.34 MIL/uL (ref 3.87–5.11)
RDW: 14.2 % (ref 11.5–15.5)
WBC: 5.1 K/uL (ref 4.0–10.5)
nRBC: 0 % (ref 0.0–0.2)

## 2023-11-14 LAB — BASIC METABOLIC PANEL WITH GFR
Anion gap: 7 (ref 5–15)
BUN: 8 mg/dL (ref 8–23)
CO2: 24 mmol/L (ref 22–32)
Calcium: 8.8 mg/dL — ABNORMAL LOW (ref 8.9–10.3)
Chloride: 110 mmol/L (ref 98–111)
Creatinine, Ser: 0.97 mg/dL (ref 0.44–1.00)
GFR, Estimated: >60 mL/min (ref >60)
Glucose, Bld: 86 mg/dL (ref 70–99)
Potassium: 4.3 mmol/L (ref 3.5–5.1)
Sodium: 141 mmol/L (ref 135–145)

## 2023-11-14 SURGERY — EGD (ESOPHAGOGASTRODUODENOSCOPY)
Anesthesia: General

## 2023-11-14 MED ORDER — LEVOTHYROXINE SODIUM 175 MCG PO TABS: 175.0000 ug | ORAL_TABLET | Freq: Every day | ORAL | 0 refills | Status: DC

## 2023-11-14 MED ORDER — TORSEMIDE 10 MG PO TABS
10.0000 mg | ORAL_TABLET | Freq: Every day | ORAL | Status: AC | PRN
Start: 2023-11-14 — End: ?

## 2023-11-14 MED ORDER — PANTOPRAZOLE SODIUM 40 MG PO TBEC: 40.0000 mg | DELAYED_RELEASE_TABLET | Freq: Every day | ORAL | 0 refills | Status: AC

## 2023-11-14 MED ORDER — PROPOFOL 500 MG/50ML IV EMUL
INTRAVENOUS | Status: DC | PRN
  Administered 2023-11-14: 150 ug/kg/min via INTRAVENOUS
  Administered 2023-11-14: 100 mg via INTRAVENOUS

## 2023-11-14 MED ORDER — SODIUM CHLORIDE 0.9 % IV SOLN: INTRAVENOUS | Status: DC

## 2023-11-14 MED ORDER — LIDOCAINE HCL (PF) 2 % IJ SOLN
INTRAMUSCULAR | Status: DC | PRN
  Administered 2023-11-14: 100 mg via INTRADERMAL

## 2023-11-14 MED ORDER — POTASSIUM CHLORIDE IN NACL 20-0.9 MEQ/L-% IV SOLN
INTRAVENOUS | Status: DC
  Filled 2023-11-14: qty 1000

## 2023-11-14 MED ORDER — LEVOTHYROXINE SODIUM 50 MCG PO TABS: 175.0000 ug | ORAL_TABLET | Freq: Every day | ORAL | Status: DC

## 2023-11-14 NOTE — Plan of Care (Signed)
  Problem: Education: Goal: Knowledge of condition and prescribed therapy will improve Outcome: Progressing   Problem: Education: Goal: Knowledge of General Education information will improve Description: Including pain rating scale, medication(s)/side effects and non-pharmacologic comfort measures Outcome: Progressing   Problem: Elimination: Goal: Will not experience complications related to urinary retention Outcome: Progressing   Problem: Pain Managment: Goal: General experience of comfort will improve and/or be controlled Outcome: Progressing

## 2023-11-14 NOTE — OR Nursing (Signed)
 1215 Notified by check in desk that pt sister wanted to speak with RN aware of pt results post EGD; pt was asked if family accompanied to Endo, stated sister was to be coming, but not present; pt sister requesting results of procedure, informed was unable to give results and made her aware MD off unit in meeting and to return later afternoon; encouraged her to speak with her sister, whom had already been return to room, about her results.

## 2023-11-14 NOTE — Plan of Care (Signed)
  Problem: Education: Goal: Knowledge of condition and prescribed therapy will improve Outcome: Completed/Met   Problem: Cardiac: Goal: Will achieve and/or maintain adequate cardiac output Outcome: Completed/Met   Problem: Physical Regulation: Goal: Complications related to the disease process, condition or treatment will be avoided or minimized Outcome: Completed/Met   Problem: Education: Goal: Knowledge of General Education information will improve Description: Including pain rating scale, medication(s)/side effects and non-pharmacologic comfort measures Outcome: Completed/Met   Problem: Health Behavior/Discharge Planning: Goal: Ability to manage health-related needs will improve Outcome: Completed/Met   Problem: Clinical Measurements: Goal: Ability to maintain clinical measurements within normal limits will improve Outcome: Completed/Met Goal: Will remain free from infection Outcome: Completed/Met Goal: Diagnostic test results will improve Outcome: Completed/Met Goal: Respiratory complications will improve Outcome: Completed/Met Goal: Cardiovascular complication will be avoided Outcome: Completed/Met   Problem: Coping: Goal: Level of anxiety will decrease Outcome: Completed/Met   Problem: Nutrition: Goal: Adequate nutrition will be maintained Outcome: Completed/Met   Problem: Activity: Goal: Risk for activity intolerance will decrease Outcome: Completed/Met   Problem: Elimination: Goal: Will not experience complications related to bowel motility Outcome: Completed/Met Goal: Will not experience complications related to urinary retention Outcome: Completed/Met   Problem: Pain Managment: Goal: General experience of comfort will improve and/or be controlled Outcome: Completed/Met   Problem: Safety: Goal: Ability to remain free from injury will improve Outcome: Completed/Met   Problem: Skin Integrity: Goal: Risk for impaired skin integrity will  decrease Outcome: Completed/Met

## 2023-11-14 NOTE — Anesthesia Preprocedure Evaluation (Signed)
 Anesthesia Evaluation  Patient identified by MRN, date of birth, ID band Patient awake    Reviewed: Allergy & Precautions, H&P , NPO status , Patient's Chart, lab work & pertinent test results  Airway Mallampati: III  TM Distance: >3 FB Neck ROM: limited    Dental  (+) Upper Dentures, Poor Dentition, Chipped, Missing   Pulmonary neg pulmonary ROS   Pulmonary exam normal        Cardiovascular hypertension, + angina  +CHF  Normal cardiovascular exam  Admitted with chest pain, slightly elevated troponin, thought secondary to demand ischemia.  Normal LHC in 2019. No SWMA on ECHO last year.  Per Cardiology, no further cardiac eval indicated, ok to proceed with surgery   Neuro/Psych  Neuromuscular disease  negative psych ROS   GI/Hepatic Neg liver ROS, hiatal hernia,GERD  Controlled,,  Endo/Other  Hypothyroidism    Renal/GU      Musculoskeletal   Abdominal   Peds  Hematology negative hematology ROS (+)   Anesthesia Other Findings Past Medical History: No date: Angina pectoris syndrome (HCC) No date: Arterial hemorrhage     Comment:  Lt ankle possible venous bleed No date: Arthritis No date: Hypertension No date: Hypothyroidism No date: Neuropathy No date: Varicose vein of leg  Past Surgical History: No date: ABDOMINAL HYSTERECTOMY 01/19/2013: BREAST BIOPSY; Right     Comment:  stereo biopsy of two areas neg No date: JOINT REPLACEMENT     Comment:  lt knee 04/27/2015: KNEE ARTHROPLASTY; Right     Comment:  Procedure: COMPUTER ASSISTED TOTAL KNEE ARTHROPLASTY;                Surgeon: Lynwood SHAUNNA Hue, MD;  Location: [facility] ORS;                Service: Orthopedics;  Laterality: Right; 04/10/2017: LEFT HEART CATH AND CORONARY ANGIOGRAPHY; N/A     Comment:  Procedure: LEFT HEART CATH AND CORONARY ANGIOGRAPHY;                Surgeon: Bosie Vinie LABOR, MD;  Location: [facility] INVASIVE CV              LAB;  Service: Cardiovascular;   Laterality: N/A; No date: SHOULDER ARTHROSCOPY; Left No date: THYROID  SURGERY; Bilateral No date: TUMOR REMOVAL; Right     Comment:  neck  BMI    Body Mass Index: 39.75 kg/m      Reproductive/Obstetrics negative OB ROS                              Anesthesia Physical Anesthesia Plan  ASA: 3  Anesthesia Plan: General   Post-op Pain Management: Minimal or no pain anticipated   Induction: Intravenous  PONV Risk Score and Plan: 2 and Propofol  infusion and TIVA  Airway Management Planned: Nasal Cannula  Additional Equipment: None  Intra-op Plan:   Post-operative Plan:   Informed Consent: I have reviewed the patients History and Physical, chart, labs and discussed the procedure including the risks, benefits and alternatives for the proposed anesthesia with the patient or authorized representative who has indicated his/her understanding and acceptance.     Dental advisory given  Plan Discussed with: CRNA and Surgeon  Anesthesia Plan Comments: (Discussed risks of anesthesia with patient, including possibility of difficulty with spontaneous ventilation under anesthesia necessitating airway intervention, PONV, and rare risks such as cardiac or respiratory or neurological events, and allergic reactions. Discussed the role of CRNA  in patient's perioperative care. Patient understands.)        Anesthesia Quick Evaluation

## 2023-11-14 NOTE — Transfer of Care (Signed)
 Immediate Anesthesia Transfer of Care Note  Patient: Brianna Dalton  Procedure(s) Performed: EGD (ESOPHAGOGASTRODUODENOSCOPY)  Patient Location: PACU  Anesthesia Type:General  Level of Consciousness: awake  Airway & Oxygen Therapy: Patient Spontanous Breathing  Post-op Assessment: Report given to RN and Post -op Vital signs reviewed and stable  Post vital signs: Reviewed and stable  Last Vitals:  Vitals Value Taken Time  BP 121/76 11/14/23 10:57  Temp 36.1 C 11/14/23 10:56  Pulse 57 11/14/23 10:57  Resp 17 11/14/23 10:57  SpO2 96 % 11/14/23 10:57  Vitals shown include unfiled device data.  Last Pain:  Vitals:   11/14/23 1056  TempSrc: Temporal  PainSc: 0-No pain         Complications: There were no known notable events for this encounter.

## 2023-11-14 NOTE — Op Note (Addendum)
 [facility] Patient Name: Brianna Dalton Procedure Date: 11/14/2023 10:29 AM MRN: [id number] Account #: [id number] Date of Birth: January 22, 1946 Admit Type: Inpatient Age: 78 Room: [facility] ENDO ROOM 4 Gender: Female Note Status: Finalized Instrument Name: Barnie GI Scope [id number] Procedure:             Upper GI endoscopy Indications:           Melena Providers:             Rogelia Copping MD, MD Referring MD:          Brianna BIRCH. Auston, MD (Referring MD) Medicines:             Propofol  per Anesthesia Complications:         No immediate complications. Procedure:             Pre-Anesthesia Assessment:                        - Prior to the procedure, a History and Physical was                         performed, and patient medications and allergies were                         reviewed. The patient's tolerance of previous                         anesthesia was also reviewed. The risks and benefits                         of the procedure and the sedation options and risks                         were discussed with the patient. All questions were                         answered, and informed consent was obtained. Prior                         Anticoagulants: The patient has taken no anticoagulant                         or antiplatelet agents. ASA Grade Assessment: II - A                         patient with mild systemic disease. After reviewing                         the risks and benefits, the patient was deemed in                         satisfactory condition to undergo the procedure.                        After obtaining informed consent, the endoscope was                         passed under direct vision. Throughout the procedure,  the patient's blood pressure, pulse, and oxygen                         saturations were monitored continuously. The Endoscope                         was introduced through the mouth, and advanced to  the                         second part of duodenum. The upper GI endoscopy was                         accomplished without difficulty. The patient tolerated                         the procedure well. Findings:      The examined esophagus was normal.      The entire examined stomach was normal.      Patchy mild inflammation was found in the duodenal bulb and in the first       portion of the duodenum. Impression:            - Normal esophagus.                        - Normal stomach.                        - Duodenitis.                        - No specimens collected. Recommendation:        - Return patient to hospital ward for ongoing care.                        - Resume previous diet.                        - Continue present medications. Procedure Code(s):     --- Professional ---                        [phone], Esophagogastroduodenoscopy, flexible,                         transoral; diagnostic, including collection of                         specimen(s) by brushing or washing, when performed                         (separate procedure) Diagnosis Code(s):     --- Professional ---                        K92.1, Melena (includes Hematochezia)                        K29.80, Duodenitis without bleeding CPT copyright 2022 [employer]. All rights reserved. The codes documented in this report are preliminary and upon coder review may  be revised to meet current compliance requirements. Rogelia Copping MD, MD 11/14/2023 10:56:50 AM This report has been  signed electronically. Number of Addenda: 0 Note Initiated On: 11/14/2023 10:29 AM Estimated Blood Loss:  Estimated blood loss: none.      [facility]

## 2023-11-14 NOTE — Assessment & Plan Note (Signed)
 Follow-up as outpatient.  Looks like he had a few low platelet counts as outpatient also.

## 2023-11-14 NOTE — Anesthesia Postprocedure Evaluation (Signed)
 Anesthesia Post Note  Patient: Brianna Dalton  Procedure(s) Performed: EGD (ESOPHAGOGASTRODUODENOSCOPY)  Patient location during evaluation: Endoscopy Anesthesia Type: General Level of consciousness: awake and alert Pain management: pain level controlled Vital Signs Assessment: post-procedure vital signs reviewed and stable Respiratory status: spontaneous breathing, nonlabored ventilation, respiratory function stable and patient connected to nasal cannula oxygen Cardiovascular status: blood pressure returned to baseline and stable Postop Assessment: no apparent nausea or vomiting Anesthetic complications: no   There were no known notable events for this encounter.   Last Vitals:  Vitals:   11/14/23 1107 11/14/23 1117  BP: 122/74 (!) 152/82  Pulse: (!) 50 (!) 49  Resp: (!) 7 18  Temp:    SpO2: 98% 100%    Last Pain:  Vitals:   11/14/23 1117  TempSrc:   PainSc: 0-No pain                 Debby Mines

## 2023-11-14 NOTE — Care Management Obs Status (Signed)
 MEDICARE OBSERVATION STATUS NOTIFICATION   Patient Details  Name: Brianna Dalton MRN: [id number] Date of Birth: 06-03-1945   Medicare Observation Status Notification Given:  Chaney BRANDY CHRISTIANE LELON, CMA 11/14/2023, 12:13 PM

## 2023-11-14 NOTE — Discharge Summary (Signed)
 Physician Discharge Summary   Patient: Brianna Dalton MRN: [id number] DOB: Aug 18, 1945  Admit date:     11/13/2023  Discharge date: 11/14/23  Discharge Physician: Charlie Patterson   PCP: Auston Reyes BIRCH, MD   Recommendations at discharge:   Follow-up PCP 5 days  Discharge Diagnoses: Principal Problem:   Melena Active Problems:   AKI (acute kidney injury)   Hypokalemia   Near syncope   Hypothyroidism   Coronary artery disease   Dyslipidemia   QT prolongation   Dark stools   Thrombocytopenia    Hospital Course: 78 y.o. Caucasian female with medical history significant for coronary artery disease, essential hypertension, aortic stenosis status post TAVR, and hypothyroidism, who presented to the emergency room with acute onset of presyncope and melena.  She went out for dinner and had an emergency to have a bowel movement.  When she was in the bathroom she had a large mixed solid and liquid black stools.  She later felt lightheaded when she got up and briefly dyspneic.  She was concerned about GI bleeding and therefore came to the ER.  No nausea or vomiting.  No bright red bleeding per rectum.  No fever or chills.  She denied abdominal pain.  No chest pain or palpitations.  No cough or wheezing or dyspnea.  No dysuria, oliguria or hematuria or flank pain.   ED Course: When she came to the ER, BP was 121/76 and later 150/86 heart rate of 63 and later 48 with otherwise normal vital signs.  Labs revealed hypokalemia 3.2 and blood glucose of 187 with creatinine 1.49 with otherwise unremarkable CMP.  High sensitive troponin I was 16 twice and CBC showed mild thrombocytopenia 149.  Hemoglobin and hematocrit were 14/43 compared to 14.4 and 44.2 on 07/22/2023. EKG as reviewed by me : EKG showed normal sinus rhythm at rate of 61 with slightly prolonged QT interval and Q waves inferiorly. Imaging: Abdominal and pelvic CT scan showed diverticulosis without diverticulitis and changes of hiatal  hernia.  2 view chest x-ray showed no acute cardiopulmonary disease.   The patient was given 1 L bolus of IV normal saline, 40 mg of IV Protonix  and 1 g of p.o. Tylenol .  She will be admitted to a medical telemetry observation bed for further evaluation and management.  10/22.  Hold aspirin .  Continue IV Protonix .  GI consultation.  Since she couple bites of graham crackers this morning at 8 AM endoscopy will be tomorrow.  Serial hemoglobins. 10/23.  EGD showing duodenitis.  Switch to Protonix  orally once a day upon going home.  Hold aspirin  upon going home.  No further black stools.  Not orthostatic.  Felt well upon going home.  Assessment and Plan: * Melena Hold aspirin .  Hemoglobin upon discharge 12.7.  Duodenitis seen on EGD.  Switch IV Protonix  over to oral once a day upon going home.  AKI (acute kidney injury) Improved with IV fluid.  Creatinine 1.49 on presentation and down to 0.97.  Hypokalemia Replaced  Near syncope Could be from melena or dehydration.  Felt well walking around prior to discharge.   Hypothyroidism Patient states that she has been taking her thyroid  medication.  TSH 221 and free T4 less than 0.25.  Case discussed with pharmacist and will increase her levothyroxine  to 175 mcg daily.  Recommend checking a thyroid  function tests in about 6 weeks.  Likely will have to increase levothyroxine  again.  Coronary artery disease Continue Imdur  and statin therapy and hold off aspirin .  Dyslipidemia Continue statin therapy.  QT prolongation Patient does not take metoprolol  at home.  With bradycardia we will hold off on beta-blockers.  Thrombocytopenia Follow-up as outpatient.  Looks like he had a few low platelet counts as outpatient also.         Consultants: Gastroenterology Procedures performed: EGD Disposition: Home Diet recommendation:  Cardiac diet DISCHARGE MEDICATION: Allergies as of 11/14/2023       Reactions   Oxycodone -acetaminophen  Hives,  Rash, Shortness Of Breath, Swelling   Strawberry Flavoring Agent (non-screening) Shortness Of Breath, Rash   Aloe Vera Hives   Amlodipine    Hydrocodone Hives   Latex    NEGATIVE BY IgE (<0.10)   Morphine  And Codeine Other (See Comments)   Nausea delusion   Penicillins Hives   Has patient had a PCN reaction causing immediate rash, facial/tongue/throat swelling, SOB or lightheadedness with hypotension: Yes Has patient had a PCN reaction causing severe rash involving mucus membranes or skin necrosis: No Has patient had a PCN reaction that required hospitalization: No Has patient had a PCN reaction occurring within the last 10 years: No If all of the above answers are NO, then may proceed with Cephalosporin use.   Meperidine Rash        Medication List     STOP taking these medications    allopurinol  300 MG tablet Commonly known as: ZYLOPRIM    aspirin  EC 81 MG tablet   baclofen  10 MG tablet Commonly known as: LIORESAL    colchicine  0.6 MG tablet   ergocalciferol 1.25 MG (50000 UT) capsule Commonly known as: VITAMIN D2   gabapentin  300 MG capsule Commonly known as: NEURONTIN    losartan  50 MG tablet Commonly known as: COZAAR    metoprolol  tartrate 25 MG tablet Commonly known as: LOPRESSOR    oxyCODONE  5 MG immediate release tablet Commonly known as: Oxy IR/ROXICODONE    tiZANidine  4 MG tablet Commonly known as: Zanaflex    traMADol  50 MG tablet Commonly known as: ULTRAM        TAKE these medications    isosorbide  mononitrate 30 MG 24 hr tablet Commonly known as: IMDUR  Take 30 mg by mouth daily.   levothyroxine  175 MCG tablet Commonly known as: SYNTHROID  Take 1 tablet (175 mcg total) by mouth daily at 6 (six) AM. Start taking on: November 15, 2023 What changed:  medication strength how much to take when to take this   pantoprazole  40 MG tablet Commonly known as: Protonix  Take 1 tablet (40 mg total) by mouth at bedtime.   rosuvastatin 10 MG  tablet Commonly known as: CRESTOR Take 10 mg by mouth daily.   torsemide  10 MG tablet Commonly known as: DEMADEX  Take 1 tablet (10 mg total) by mouth daily as needed.        Follow-up Information     Auston Reyes BIRCH, MD Follow up in 5 day(s).   Specialty: Internal Medicine Contact information: [address] [place] [address] [phone]                Discharge Exam: Fredricka Weights   11/12/23 2033  Weight: 121.1 kg   Physical Exam HENT:     Head: Normocephalic.  Eyes:     General: Lids are normal.     Conjunctiva/sclera: Conjunctivae normal.  Cardiovascular:     Rate and Rhythm: Normal rate and regular rhythm.     Heart sounds: Normal heart sounds, S1 normal and S2 normal.  Pulmonary:     Breath sounds: No decreased breath  sounds, wheezing, rhonchi or rales.  Abdominal:     Palpations: Abdomen is soft.     Tenderness: There is no abdominal tenderness.  Musculoskeletal:     Right lower leg: No swelling.     Left lower leg: No swelling.  Skin:    General: Skin is warm.     Findings: No rash.  Neurological:     Mental Status: She is alert and oriented to person, place, and time.      Condition at discharge: stable  The results of significant diagnostics from this hospitalization (including imaging, microbiology, ancillary and laboratory) are listed below for reference.   Imaging Studies: ECHOCARDIOGRAM COMPLETE Result Date: 11/13/2023    ECHOCARDIOGRAM REPORT   Patient Name:   KOLLYNS MICKELSON Date of Exam: 11/13/2023 Medical Rec #:  [id number]       Height:       70.0 in Accession #:    [id number]      Weight:       267.0 lb Date of Birth:  04-24-1945       BSA:          2.360 m Patient Age:    78 years        BP:           136/81 mmHg Patient Gender: F               HR:           54 bpm. Exam Location:  [facility] Procedure: 2D Echo, Color Doppler, Cardiac Doppler and Intracardiac            Opacification Agent (Both Spectral  and Color Flow Doppler were            utilized during procedure). Indications:     Syncope R55  History:         Patient has prior history of Echocardiogram examinations, most                  recent 07/09/2023. Aortic Valve Disease. S/p TAVR in Sept 2025.  Sonographer:     Ashley McNeely-Sloane Referring Phys:  [id number] JAN A MANSY Diagnosing Phys: Keller Alluri IMPRESSIONS  1. Technically difficult study, definity  used.  2. Left ventricular ejection fraction, by estimation, is 65 to 70%. The left ventricle has normal function. The left ventricle has no regional wall motion abnormalities. There is mild left ventricular hypertrophy. Left ventricular diastolic parameters are indeterminate.  3. Right ventricular systolic function is normal. The right ventricular size is normal.  4. The mitral valve was not well visualized. Trivial mitral valve regurgitation.  5. TAVR appears well seated without any significant dysfunction. Peak velocity 2.8 m/s, mean gradient 16 mmHg, Dimensionless index 0.41. Aortic valve regurgitation is not visualized. FINDINGS  Left Ventricle: Left ventricular ejection fraction, by estimation, is 65 to 70%. The left ventricle has normal function. The left ventricle has no regional wall motion abnormalities. Definity  contrast agent was given IV to delineate the left ventricular  endocardial borders. The left ventricular internal cavity size was normal in size. There is mild left ventricular hypertrophy. Left ventricular diastolic parameters are indeterminate. Right Ventricle: The right ventricular size is normal. No increase in right ventricular wall thickness. Right ventricular systolic function is normal. Left Atrium: Left atrial size was not well visualized. Right Atrium: Right atrial size was not well visualized. Pericardium: There is no evidence of pericardial effusion. Mitral Valve: The mitral valve was not well visualized. Trivial mitral valve regurgitation.  MV peak gradient, 4.5 mmHg. The  mean mitral valve gradient is 2.0 mmHg. Tricuspid Valve: The tricuspid valve is not well visualized. Tricuspid valve regurgitation is trivial. Aortic Valve: TAVR appears well seated without any significant dysfunction. Peak velocity 2.8 m/s, mean gradient 16 mmHg, Dimensionless index 0.41. Aortic valve regurgitation is not visualized. Aortic valve mean gradient measures 16.0 mmHg. Aortic valve peak gradient measures 30.5 mmHg. Aortic valve area, by VTI measures 1.42 cm. Pulmonic Valve: The pulmonic valve was not well visualized. Pulmonic valve regurgitation is not visualized. Aorta: The aortic root was not well visualized. IAS/Shunts: The interatrial septum was not well visualized.  LEFT VENTRICLE PLAX 2D LVIDd:         5.00 cm      Diastology LVIDs:         2.30 cm      LV e' medial:    5.11 cm/s LV PW:         1.50 cm      LV E/e' medial:  13.5 LV IVS:        1.60 cm      LV e' lateral:   7.62 cm/s LVOT diam:     2.10 cm      LV E/e' lateral: 9.1 LV SV:         95 LV SV Index:   40 LVOT Area:     3.46 cm  LV Volumes (MOD) LV vol d, MOD A2C: 110.0 ml LV vol d, MOD A4C: 134.0 ml LV vol s, MOD A2C: 18.6 ml LV vol s, MOD A4C: 31.2 ml LV SV MOD A2C:     91.4 ml LV SV MOD A4C:     134.0 ml LV SV MOD BP:      102.9 ml RIGHT VENTRICLE RV Basal diam:  4.20 cm RV Mid diam:    4.00 cm RV S prime:     15.10 cm/s TAPSE (M-mode): 3.9 cm LEFT ATRIUM             Index        RIGHT ATRIUM           Index LA Vol (A2C):   77.2 ml 32.71 ml/m  RA Area:     20.00 cm LA Vol (A4C):   70.8 ml 30.00 ml/m  RA Volume:   53.20 ml  22.54 ml/m LA Biplane Vol: 75.1 ml 31.82 ml/m  AORTIC VALVE                     PULMONIC VALVE AV Area (Vmax):    1.41 cm      PV Vmax:       0.65 m/s AV Area (Vmean):   1.57 cm      PV Vmean:      43.400 cm/s AV Area (VTI):     1.42 cm      PV VTI:        0.154 m AV Vmax:           276.00 cm/s   PV Peak grad:  1.7 mmHg AV Vmean:          187.000 cm/s  PV Mean grad:  1.0 mmHg AV VTI:            0.668 m AV  Peak Grad:      30.5 mmHg AV Mean Grad:      16.0 mmHg LVOT Vmax:         112.00 cm/s LVOT Vmean:  84.500 cm/s LVOT VTI:          0.273 m LVOT/AV VTI ratio: 0.41  AORTA Ao Root diam: 4.00 cm MITRAL VALVE MV Area (PHT): 2.45 cm     SHUNTS MV Area VTI:   2.47 cm     Systemic VTI:  0.27 m MV Peak grad:  4.5 mmHg     Systemic Diam: 2.10 cm MV Mean grad:  2.0 mmHg MV Vmax:       1.06 m/s MV Vmean:      60.2 cm/s MV Decel Time: 310 msec MV E velocity: 69.00 cm/s MV A velocity: 106.00 cm/s MV E/A ratio:  0.65 Keller Paterson Electronically signed by Keller Paterson Signature Date/Time: 11/13/2023/12:09:10 PM    Final    CT ABDOMEN PELVIS W CONTRAST Result Date: 11/13/2023 CLINICAL DATA:  Black tarry stools EXAM: CT ABDOMEN AND PELVIS WITH CONTRAST TECHNIQUE: Multidetector CT imaging of the abdomen and pelvis was performed using the standard protocol following bolus administration of intravenous contrast. RADIATION DOSE REDUCTION: This exam was performed according to the departmental dose-optimization program which includes automated exposure control, adjustment of the mA and/or kV according to patient size and/or use of iterative reconstruction technique. CONTRAST:  75mL OMNIPAQUE  IOHEXOL  300 MG/ML  SOLN COMPARISON:  08/06/2023 FINDINGS: Lower chest: Lung bases are clear.  Changes of prior TAVR are noted. Hepatobiliary: No focal liver abnormality is seen. Status post cholecystectomy. No biliary dilatation. Pancreas: Unremarkable. No pancreatic ductal dilatation or surrounding inflammatory changes. Spleen: Normal in size without focal abnormality. Adrenals/Urinary Tract: Adrenal glands are within normal limits. Kidneys are well visualized bilaterally. No renal calculi or obstructive changes are seen. The bladder is decompressed. Stomach/Bowel: Scattered diverticular change of the colon is noted without evidence of diverticulitis. The appendix is within normal limits. Small bowel and stomach are within normal  limits with the exception of a small sliding-type hiatal hernia. Vascular/Lymphatic: Aortic atherosclerosis. No enlarged abdominal or pelvic lymph nodes. Reproductive: Status post hysterectomy. No adnexal masses. Other: No abdominal wall hernia or abnormality. No abdominopelvic ascites. Musculoskeletal: No acute or significant osseous findings. IMPRESSION: Diverticulosis without diverticulitis. Changes of hiatal hernia. Electronically Signed   By: Oneil Devonshire M.D.   On: 11/13/2023 01:28   DG Chest 2 View Result Date: 11/12/2023 EXAM: 2 VIEW(S) XRAY OF THE CHEST 11/12/2023 08:55:05 PM COMPARISON: 07/22/2023 CLINICAL HISTORY: cp. Near syncope, chest pain, pain travels up left side of neck. Recent heart valve replacement x 4 weeks ago FINDINGS: LUNGS AND PLEURA: No focal pulmonary opacity. No pulmonary edema. No pleural effusion. No pneumothorax. HEART AND MEDIASTINUM: Aortic atherosclerosis. Transcatheter aortic valve replacement noted. BONES AND SOFT TISSUES: Multilevel thoracic osteophytosis. IMPRESSION: 1. No acute process. Electronically signed by: Norman Gatlin MD 11/12/2023 09:13 PM EDT RP Workstation: [id number]     Labs: CBC: Recent Labs  Lab 11/12/23 2036 11/13/23 0553 11/13/23 1021 11/13/23 1627 11/14/23 0401  WBC 7.6 7.8  --   --  5.1  HGB 14.0 13.6 13.0 12.7 12.7  HCT 43.0 41.7 40.2 38.0 39.4  MCV 90.1 89.9  --   --  90.8  PLT 149* 142*  --   --  123*   Basic Metabolic Panel: Recent Labs  Lab 11/12/23 2036 11/13/23 0031 11/13/23 0553 11/14/23 0401  NA 137  --  139 141  K 3.2*  --  3.9 4.3  CL 99  --  103 110  CO2 25  --  26 24  GLUCOSE 187*  --  102* 86  BUN 16  --  14 8  CREATININE 1.49*  --  1.10* 0.97  CALCIUM  9.1  --  8.8* 8.8*  MG  --  2.0  --   --    Liver Function Tests: Recent Labs  Lab 11/12/23 2036  AST 37  ALT 21  ALKPHOS 78  BILITOT 0.7  PROT 6.6  ALBUMIN 4.1   CBG: Recent Labs  Lab 11/13/23 0629  GLUCAP 76    Discharge time spent:  greater than 30 minutes.  Signed: Charlie Patterson, MD [facility] 11/14/2023

## 2023-12-15 ENCOUNTER — Other Ambulatory Visit: Payer: Self-pay | Admitting: Physician Assistant

## 2023-12-27 ENCOUNTER — Ambulatory Visit
Admission: RE | Admit: 2023-12-27 | Discharge: 2023-12-27 | Disposition: A | Source: Ambulatory Visit | Attending: Orthopedic Surgery | Admitting: Orthopedic Surgery

## 2023-12-27 ENCOUNTER — Other Ambulatory Visit: Payer: Self-pay | Admitting: Orthopedic Surgery

## 2023-12-27 DIAGNOSIS — R0602 Shortness of breath: Secondary | ICD-10-CM

## 2023-12-27 DIAGNOSIS — M79662 Pain in left lower leg: Secondary | ICD-10-CM

## 2023-12-27 NOTE — Progress Notes (Signed)
 History of Present Illness:  History of Present Illness Brianna Dalton is a 78 year old female who presents with persistent pain and weakness in the left knee following a fall.  In September 2025 she fell and sustained significant bruising and swelling of the left knee. Since then she has had persistent left knee pain and weakness that worsen in the evening, limit walking, and increase fall risk. She uses heat wraps and ice packs with partial relief.  She describes deep bone pressure, soreness, and a focal hard, very tender area in the left knee. Pain is worse with motion, especially bending. In the evening she develops numbness in the left leg and toes, plus discoloration and soreness around the ankle. She wears cushioned shoes to reduce irritation.  She has heart disease with valve replacement about 7-8 weeks ago and has had shortness of breath since October 2025. She is worried about a clot due to calf pain and leg swelling and has not had a recent lower extremity ultrasound.  She recently increased activity to walking 4-5 miles daily for weight loss and has lost over 30 pounds in about 2.5-3 months. Left leg weakness and evening fatigue limit activity and contribute to fall risk. She uses a cane outdoors but not in her apartment.  Current pain regimen is Tylenol  Extra Strength twice daily. A recent course of Celebrex  improved leg fatigue and pain. She is not taking Flexeril. She uses an ice machine for swelling and Voltaren gel with partial relief.  Current Outpatient Medications  Medication Sig Dispense Refill   acetaminophen  (TYLENOL ) 325 MG tablet Take 2 tablets (650 mg total) by mouth every 6 (six) hours as needed for Pain     aspirin  81 MG EC tablet Take 1 tablet (81 mg total) by mouth once daily for 90 days 90 tablet 0   baclofen  (LIORESAL ) 10 MG tablet Take 10 mg by mouth as needed     celecoxib  (CELEBREX ) 100 MG capsule Take 1 capsule (100 mg total) by mouth 2 (two) times daily  for 14 days 28 capsule 0   cyclobenzaprine (FLEXERIL) 10 MG tablet Take 1 tablet (10 mg total) by mouth at bedtime 30 tablet 2   isosorbide  mononitrate (IMDUR ) 30 MG ER tablet TAKE 1 TABLET BY MOUTH EVERY DAY 90 tablet 3   levothyroxine  (SYNTHROID ) 100 MCG tablet TAKE 1 TABLET (100 MCG TOTAL) BY MOUTH ONCE DAILY TAKE ON AN EMPTY STOMACH WITH A GLASS OF WATER AT LEAST 30-60 MINUTES BEFORE BREAKFAST. 90 tablet 3   losartan  (COZAAR ) 50 MG tablet TAKE 1 TABLET BY MOUTH EVERY DAY 90 tablet 3   rosuvastatin  (CRESTOR ) 10 MG tablet TAKE 1 TABLET BY MOUTH EVERY DAY 90 tablet 1   TORsemide  (DEMADEX ) 10 MG tablet TAKE 1 TABLET BY MOUTH EVERY DAY 90 tablet 1   triamcinolone  0.5 % cream Apply topically 2 (two) times daily 30 g 5   No current facility-administered medications for this visit.   Allergies  Allergen Reactions   Percocet [Oxycodone -Acetaminophen ] Hives, Shortness Of Breath, Swelling and Rash   Strawberry Flavor Rash and Shortness Of Breath   Aloe Vera Hives   Amlodipine Swelling   Penicillins Hives and Rash   Demerol [Meperidine] Rash   Hydrocod-Cpm-Pe-Acetaminophen  Rash   Latex Swelling and Rash   Past Medical History:  Diagnosis Date   Arthritis    Asthma without status asthmaticus (HHS-HCC)    as a child   Edema, lower extremity    GERD (gastroesophageal reflux disease)  H/O degenerative disc disease    Hiatal hernia    Hyperlipidemia    Hypertension    Infectious diarrhea    Morbid obesity (CMS-HCC)    Neuropathy    Non-cardiac chest pain    Osteoarthritis    w/ knee pain   PONV (postoperative nausea and vomiting)    Uncertain if response to medication   Renal insufficiency    Thyroid  disease    Past Surgical History:  Procedure Laterality Date   Left total knee arthroplasty  01/20/2011   Right total knee arthroplasty using computer-assisted navigation  04/27/2015   Dr Mardee   TRANSCATHETER AORTIC VALVE REPLACEMENT N/A 09/17/2023    Procedure: TRANSCATHETER AORTIC VALVE REPLACEMENT (SAPIEN) WITH PROSTHETIC VALVE; PERCUTANEOUS FEMORAL ARTERY APPROACH;  Surgeon: Vekstein, Andrew Marsh, MD;  Location: [facility];  Service: Cardiothoracic;  Laterality: N/A;   TRANSCATHETER AORTIC VALVE REPLACEMENT N/A 09/17/2023   Procedure: TRANSCATHETER AORTIC VALVE REPLACEMENT (SAPIEN) WITH PROSTHETIC VALVE; PERCUTANEOUS FEMORAL ARTERY APPROACH;  Surgeon: Katina Murray Durand, MD;  Location: [facility];  Service: General Surgery;  Laterality: N/A;   Bunionectomy, right     Cardiac cath     CHOLECYSTECTOMY     HYSTERECTOMY     neck surgery     THYROIDECTOMY TOTAL     TONSILLECTOMY     wisdom teeth removal       Family History  Problem Relation Name Age of Onset   Brain cancer Mother     Myocardial Infarction (Heart attack) Mother     Heart disease Father     Myocardial Infarction (Heart attack) Father     Cancer Sister     Anesthesia problems Neg Hx     Malignant hypertension Neg Hx     Malignant hyperthermia Neg Hx      Social History   Socioeconomic History   Marital status: Divorced   Number of children: 0   Years of education: 14  Occupational History   Occupation: Retired Ecologist  Tobacco Use   Smoking status: Never   Smokeless tobacco: Never  Vaping Use   Vaping status: Never Used  Substance and Sexual Activity   Alcohol use: No    Alcohol/week: 0.0 standard drinks of alcohol    Comment: Occasionally drinks alcohol.   Drug use: No   Sexual activity: Defer    Partners: Male   Social Drivers of Health   Financial Resource Strain: Low Risk  (12/27/2023)   Overall Financial Resource Strain (CARDIA)    Difficulty of Paying Living Expenses: Not hard at all  Food Insecurity: No Food Insecurity (12/27/2023)   Hunger Vital Sign    Worried About Running Out of Food in the Last Year: Never true    Ran Out of Food in the Last Year: Never true  Transportation  Needs: No Transportation Needs (12/27/2023)   PRAPARE - Administrator, Civil Service (Medical): No    Lack of Transportation (Non-Medical): No    Review of Systems:  A comprehensive 14 point ROS was performed, reviewed, and the pertinent orthopaedic findings are documented in the HPI.  Physical Exam: Vitals:   12/27/23 1320 12/27/23 1332  BP: 132/80   Weight: (!) 112.9 kg (249 lb)   Height: 177.8 cm (5' 10)   PainSc:   4   4  PainLoc: Knee Knee   General/Constitutional: The patient appears to be well-nourished, well-developed, and in no acute distress Neuro/Psych: Normal mood and affect, oriented to person, place and  time Respiratory: Normal chest excursion, no wheezes, non-labored breathing O2 sat 96% Vascular: No edema, swelling or tenderness, except as noted in detailed exam Integumentary: Well healed incisions on both anterior knees. Skin dimpling noted. Musculoskeletal: Unremarkable, except as noted in detailed exam  Left  knee exam:     Soft tissue swelling: moderate    Effusion:  none    Erythema:  none    Crepitance:  none    Tenderness:  medial joint line, pes anserinus, patellar tendon, and hamstrings    Alignment:  normal    Mediolateral laxity: stable    Anterior drawer test:negative    Lachman`s test: negative    McMurray`s test: negative    Atrophy:  Generalized quadriceps atrophy.      Quadriceps tone was poor.     Range of Motion: 0/0/110 degrees  Patient is neurovascularly intact all dermatomes extending down their Bilateral lower extremity and into their foot.  Posterior tibial pulses appreciated, 2+.    Knee Imaging: AP and lateral weightbearing of left, as well as merchant views of both knees were obtained are obtained.  Bony calcification anterior to patella. Left knee implant without signs of tear or compromise. Overall alignment is neutral.  No fractures, lytic lesions, or abnormal calcifications are noted.   Assessment:   Encounter Diagnoses  Name Primary?   Status post total left knee replacement Yes   Status post total right knee replacement    Pain of left calf    Shortness of breath     Plan: The treatment options were discussed with the patient. All of the patient's questions and concerns were answered.  Patient call any time with further concerns.   Assessment & Plan Pain and bony calcification of left knee prosthesis Chronic pain and bony calcification in the left knee prosthesis, exacerbated by a fall. X-rays show bony calcification and osteophyte formation, with no hardware issues. Pain likely due to inflammation and scar tissue formation. Conservative management preferred due to fall risk and potential recurrence of bony calcification. - Prescribed Celebrex  100 mg twice daily. - Continue icing the knee 2-3 times daily using ice machines. - Continue using Voltaren gel 3-4 times daily. - Continue strengthening exercises for quadriceps and leg muscles. - Consider steroid injection if no improvement in 4 weeks.  Left lower leg pain and swelling, rule out deep vein thrombosis Left lower leg pain with new swelling with discoloration and numbness, raising concern for DVT. Positive Homan's sign. Differential includes known venous insufficiency. or muscle stiffness. Recent valve replacement and history of falls increase risk for DVT. - Ordered ultrasound of the left lower leg to rule out DVT. - Advised to continue using ice machines for swelling management.  Shortness of breath Intermittent shortness of breath, particularly after physical activity, possibly related to recent valve replacement and overall cardiovascular status. - Monitor symptoms and consider further evaluation if shortness of breath persists or worsens.   Recording duration: 34 minutes  This note has been created using automated tools and reviewed for accuracy by JULIANA VOLPINI CASTANHEIRO DE CARVALHO COSTA.  Vick Dunk,  PA-C [facility]  I spent a total of 62 minutes in both face-to-face and non-face-to-face activities, excluding procedures performed, for this visit on the date of this encounter.

## 2024-01-27 ENCOUNTER — Other Ambulatory Visit: Payer: Self-pay

## 2024-01-27 ENCOUNTER — Emergency Department

## 2024-01-27 ENCOUNTER — Emergency Department
Admission: EM | Admit: 2024-01-27 | Discharge: 2024-01-27 | Disposition: A | Discharge status: Home / Self Care | Source: Ambulatory Visit | Attending: Emergency Medicine | Admitting: Emergency Medicine

## 2024-01-27 DIAGNOSIS — I2510 Atherosclerotic heart disease of native coronary artery without angina pectoris: Secondary | ICD-10-CM | POA: Diagnosis not present

## 2024-01-27 DIAGNOSIS — I10 Essential (primary) hypertension: Secondary | ICD-10-CM | POA: Insufficient documentation

## 2024-01-27 DIAGNOSIS — R0789 Other chest pain: Secondary | ICD-10-CM | POA: Insufficient documentation

## 2024-01-27 LAB — BASIC METABOLIC PANEL WITH GFR
Anion gap: 15 (ref 5–15)
BUN: 11 mg/dL (ref 8–23)
CO2: 24 mmol/L (ref 22–32)
Calcium: 10 mg/dL (ref 8.9–10.3)
Chloride: 100 mmol/L (ref 98–111)
Creatinine, Ser: 1.17 mg/dL — ABNORMAL HIGH (ref 0.44–1.00)
GFR, Estimated: 48 mL/min — ABNORMAL LOW
Glucose, Bld: 149 mg/dL — ABNORMAL HIGH (ref 70–99)
Potassium: 3.9 mmol/L (ref 3.5–5.1)
Sodium: 139 mmol/L (ref 135–145)

## 2024-01-27 LAB — CBC
HCT: 50 % — ABNORMAL HIGH (ref 36.0–46.0)
Hemoglobin: 16.1 g/dL — ABNORMAL HIGH (ref 12.0–15.0)
MCH: 29 pg (ref 26.0–34.0)
MCHC: 32.2 g/dL (ref 30.0–36.0)
MCV: 90.1 fL (ref 80.0–100.0)
Platelets: 155 K/uL (ref 150–400)
RBC: 5.55 MIL/uL — ABNORMAL HIGH (ref 3.87–5.11)
RDW: 14.2 % (ref 11.5–15.5)
WBC: 6.4 K/uL (ref 4.0–10.5)
nRBC: 0 % (ref 0.0–0.2)

## 2024-01-27 LAB — TROPONIN T, HIGH SENSITIVITY: Troponin T High Sensitivity: 16 ng/L (ref 0–19)

## 2024-01-27 NOTE — ED Triage Notes (Addendum)
 Pt comes with left sided cp and irregular HR. Pt states dry cough. Pt states this started few days ago.

## 2024-01-27 NOTE — ED Provider Notes (Signed)
 "  [facility] Provider Note    Event Date/Time   First MD Initiated Contact with Patient 01/27/24 1340     (approximate)   History   Chest Pain   HPI  Brianna Dalton is a 79 y.o. female with history of hypertension, CAD who presents with complaints of feeling like her blood pressure is high.  She reports she felt a tightness in her chest earlier this morning, which quickly resolved.  Thereafter she felt flushed in her face which is often a sign that she has high blood pressure.  When she checked her blood pressure she found it to be over 150 systolic.  She denies chest pain or shortness of breath at this time.  Denies palpitations to me.     Physical Exam   Triage Vital Signs: ED Triage Vitals  Encounter Vitals Group     BP 01/27/24 1224 (!) 146/97     Girls Systolic BP Percentile --      Girls Diastolic BP Percentile --      Boys Systolic BP Percentile --      Boys Diastolic BP Percentile --      Pulse Rate 01/27/24 1223 (!) 109     Resp 01/27/24 1223 18     Temp 01/27/24 1223 98 F (36.7 C)     Temp src --      SpO2 01/27/24 1223 100 %     Weight 01/27/24 1222 89.8 kg (198 lb)     Height 01/27/24 1222 1.778 m (5' 10)     Head Circumference --      Peak Flow --      Pain Score 01/27/24 1222 7     Pain Loc --      Pain Education --      Exclude from Growth Chart --     Most recent vital signs: Vitals:   01/27/24 1223 01/27/24 1224  BP:  (!) 146/97  Pulse: (!) 109   Resp: 18   Temp: 98 F (36.7 C)   SpO2: 100%      General: Awake, no distress.  CV:  Good peripheral perfusion.  Regular rate and rhythm, heart rate is 92 on my exam Resp:  Normal effort.  Clear to auscultation bilaterally Abd:  No distention.  Other:  No leg swelling or edema   ED Results / Procedures / Treatments   Labs (all labs ordered are listed, but only abnormal results are displayed) Labs Reviewed  BASIC METABOLIC PANEL WITH GFR - Abnormal; Notable for  the following components:      Result Value   Glucose, Bld 149 (*)    Creatinine, Ser 1.17 (*)    GFR, Estimated 48 (*)    All other components within normal limits  CBC - Abnormal; Notable for the following components:   RBC 5.55 (*)    Hemoglobin 16.1 (*)    HCT 50.0 (*)    All other components within normal limits  TROPONIN T, HIGH SENSITIVITY     EKG  ED ECG REPORT I, Lamar Price, the attending physician, personally viewed and interpreted this ECG.  Date: 01/27/2024  Rhythm: normal sinus rhythm QRS Axis: normal Intervals: normal ST/T Wave abnormalities: normal Narrative Interpretation: no evidence of acute ischemia    RADIOLOGY Chest x-ray viewed interpret by me, no acute abnormality    PROCEDURES:  Critical Care performed:   Procedures   MEDICATIONS ORDERED IN ED: Medications - No data to display  IMPRESSION / MDM / ASSESSMENT AND PLAN / ED COURSE  I reviewed the triage vital signs and the nursing notes. Patient's presentation is most consistent with exacerbation of chronic illness.  Patient presents with symptoms as above, she does not appear to have any recent chest pain and seems most concerned about the possibility of elevated blood pressure.  Her blood pressure is mildly elevated here but in line with prior levels.  She initially had mild tachycardia which resolved without intervention.  She feels well and overall has reassuring exam and is no acute distress.  Given reassuring workup she is feeling much better, appropriate for discharge at this time with outpatient follow-up        FINAL CLINICAL IMPRESSION(S) / ED DIAGNOSES   Final diagnoses:  Atypical chest pain     Rx / DC Orders   ED Discharge Orders     None        Note:  This document was prepared using Dragon voice recognition software and may include unintentional dictation errors.   Arlander Charleston, MD 01/27/24 1503  "

## 2024-02-03 NOTE — Progress Notes (Signed)
 Brianna Dalton is a  79 y.o. female who presents for  CHIEF COMPLAINT Chief Complaint  Patient presents with   Ed Follow-up   Chest Pain   Hypertension   Obesity    Subjective: History of Present Illness  Pt in NAD. Here for ER f/u of CP. ER w/u neative. BMI>34. HTN stable on meds. Has HLD on statin, thyroid  dz on Synthroid  and prediabetes not on meds. Weight stable. Has not followed up with Cardiology. CP is focal. Some hoarseness. No fever or ST. Some SOB and weak spells. Some palpitations. No change in bowels or bladder.    Past Medical History:  Diagnosis Date   Arthritis    Asthma without status asthmaticus (HHS-HCC)    as a child   Edema, lower extremity    GERD (gastroesophageal reflux disease)    H/O degenerative disc disease    Hiatal hernia    Hyperlipidemia    Hypertension    Infectious diarrhea    Morbid obesity (CMS-HCC)    Neuropathy    Non-cardiac chest pain    Osteoarthritis    w/ knee pain   PONV (postoperative nausea and vomiting)    Uncertain if response to medication   Renal insufficiency    Thyroid  disease    Patient Active Problem List  Diagnosis   Class 2 severe obesity due to excess calories with serious comorbidity and body mass index (BMI) of 37.0 to 37.9 in adult   HTN (hypertension)   Hyperlipidemia   OA (osteoarthritis)   GERD (gastroesophageal reflux disease)   Renal insufficiency   Lumbar radiculopathy   Non-cardiac chest pain   Osteoarthritis   Arthritis   Infectious diarrhea   Hiatal hernia   Heart palpitations   Status post total right knee replacement   Thyroid  disease   Status post total left knee replacement   Bilateral carotid artery stenosis   SOBOE (shortness of breath on exertion)   Swelling of foot joint, right   Acute gout of right foot   Encounter for long-term (current) use of high-risk medication   Pain and swelling of lower leg, right   Nonrheumatic aortic valve  stenosis   Acute cholecystitis   Cholelithiasis   Chronic venous insufficiency   Lymphedema   Swelling of limb   Varicose veins of lower extremity with pain, bilateral   Aortic atherosclerosis   Postural dizziness with presyncope   Prediabetes   Bilateral occipital neuralgia   Localized, primary osteoarthritis   Preoperative evaluation to rule out surgical contraindication   Aneurysm of ascending aorta without rupture ()   History of thyroid  cancer   Acquired hypothyroidism   S/P TAVR (transcatheter aortic valve replacement)    Past Surgical History:  Procedure Laterality Date   Left total knee arthroplasty  01/20/2011   Right total knee arthroplasty using computer-assisted navigation  04/27/2015   Dr Mardee   TRANSCATHETER AORTIC VALVE REPLACEMENT N/A 09/17/2023   Procedure: TRANSCATHETER AORTIC VALVE REPLACEMENT (SAPIEN) WITH PROSTHETIC VALVE; PERCUTANEOUS FEMORAL ARTERY APPROACH;  Surgeon: Jannifer Prentice Ferries, MD;  Location: [facility];  Service: Cardiothoracic;  Laterality: N/A;   TRANSCATHETER AORTIC VALVE REPLACEMENT N/A 09/17/2023   Procedure: TRANSCATHETER AORTIC VALVE REPLACEMENT (SAPIEN) WITH PROSTHETIC VALVE; PERCUTANEOUS FEMORAL ARTERY APPROACH;  Surgeon: Katina Murray Durand, MD;  Location: [facility];  Service: General Surgery;  Laterality: N/A;   Bunionectomy, right     Cardiac cath     CHOLECYSTECTOMY     HYSTERECTOMY     neck  surgery     THYROIDECTOMY TOTAL     TONSILLECTOMY     wisdom teeth removal        Current Outpatient Medications:    acetaminophen  (TYLENOL ) 325 MG tablet, Take 2 tablets (650 mg total) by mouth every 6 (six) hours as needed for Pain, Disp: , Rfl:    aspirin  81 MG EC tablet, Take 1 tablet (81 mg total) by mouth once daily for 90 days, Disp: 90 tablet, Rfl: 0   baclofen  (LIORESAL ) 10 MG tablet, Take 10 mg by mouth as needed, Disp: , Rfl:    cyclobenzaprine (FLEXERIL) 10 MG tablet, Take 1  tablet (10 mg total) by mouth at bedtime, Disp: 30 tablet, Rfl: 2   isosorbide  mononitrate (IMDUR ) 30 MG ER tablet, TAKE 1 TABLET BY MOUTH EVERY DAY, Disp: 90 tablet, Rfl: 3   levothyroxine  (SYNTHROID ) 100 MCG tablet, TAKE 1 TABLET (100 MCG TOTAL) BY MOUTH ONCE DAILY TAKE ON AN EMPTY STOMACH WITH A GLASS OF WATER AT LEAST 30-60 MINUTES BEFORE BREAKFAST., Disp: 90 tablet, Rfl: 3   losartan  (COZAAR ) 50 MG tablet, TAKE 1 TABLET BY MOUTH EVERY DAY, Disp: 90 tablet, Rfl: 3   rosuvastatin  (CRESTOR ) 10 MG tablet, TAKE 1 TABLET BY MOUTH EVERY DAY, Disp: 90 tablet, Rfl: 1   TORsemide  (DEMADEX ) 10 MG tablet, TAKE 1 TABLET BY MOUTH EVERY DAY, Disp: 90 tablet, Rfl: 1   triamcinolone  0.5 % cream, Apply topically 2 (two) times daily, Disp: 30 g, Rfl: 5  Percocet [oxycodone -acetaminophen ], Strawberry flavor, Aloe vera, Amlodipine, Penicillins, Demerol [meperidine], Hydrocod-cpm-pe-acetaminophen , and Latex  Social History   Socioeconomic History   Marital status: Divorced   Number of children: 0   Years of education: 14  Occupational History   Occupation: Retired Ecologist  Tobacco Use   Smoking status: Never   Smokeless tobacco: Never  Vaping Use   Vaping status: Never Used  Substance and Sexual Activity   Alcohol use: No    Alcohol/week: 0.0 standard drinks of alcohol    Comment: Occasionally drinks alcohol.   Drug use: No   Sexual activity: Defer    Partners: Male   Social Drivers of Health   Financial Resource Strain: Low Risk  (12/27/2023)   Overall Financial Resource Strain (CARDIA)    Difficulty of Paying Living Expenses: Not hard at all  Food Insecurity: No Food Insecurity (12/27/2023)   Hunger Vital Sign    Worried About Running Out of Food in the Last Year: Never true    Ran Out of Food in the Last Year: Never true  Transportation Needs: No Transportation Needs (12/27/2023)   PRAPARE - Administrator, Civil Service (Medical): No    Lack of  Transportation (Non-Medical): No  Social Connections: Moderately Integrated (11/13/2023)   Received from [facility]   Social Connection and Isolation Panel    In a typical week, how many times do you talk on the phone with family, friends, or neighbors?: More than three times a week    How often do you get together with friends or relatives?: Once a week    How often do you attend church or religious services?: More than 4 times per year    Do you belong to any clubs or organizations such as church groups, unions, fraternal or athletic groups, or school groups?: Yes    How often do you attend meetings of the clubs or organizations you belong to?: More than 4 times per year    Are  you married, widowed, divorced, separated, never married, or living with a partner?: Divorced  Housing Stability: Low Risk  (12/27/2023)   Housing Stability Vital Sign    Unable to Pay for Housing in the Last Year: No    Number of Times Moved in the Last Year: 0    Homeless in the Last Year: No    Family History  Problem Relation Name Age of Onset   Brain cancer Mother     Myocardial Infarction (Heart attack) Mother     Heart disease Father     Myocardial Infarction (Heart attack) Father     Cancer Sister     Anesthesia problems Neg Hx     Malignant hypertension Neg Hx     Malignant hyperthermia Neg Hx      A comprehensive ROS was negative except for HPI  PE: BP 100/60   Pulse 78   Wt (!) 109.7 kg (241 lb 12.8 oz)   SpO2 96%   BMI 34.69 kg/m  General: Alert oriented x3    Eyes: Sclera and conjunctiva clear; pupils equal round and reactive to light and accommodation; extraocular movements intact Nose: Mucosa healthy without drainage or ulceration Oropharynx: No suspicious lesions Neck: No swelling, masses, stiffness, pain, limited movement, carotid pulses normal bilaterally, thyroid  normal size, no masses palpated. No bruits heard. Lungs: Respirations unlabored; clear to  auscultation bilaterally Back: No spinal deformity Cardiovascular: Heart regular rate and rhythm without murmurs, gallops, or rubs Abdomen: Soft; non tender; non distended;  no masses or organomegaly Lymph Nodes: No significant cervical, supraclavicular, or axillary lymphadenopathy noted Musculoskeletal: No active joint inflammation Extremities: Normal, no edema Pulses: Dorsalis pedis palpable and symmetric bilaterally Neurologic: Alert and oriented; speech intact; face symmetrical; moves all extremities well    Post Op on 10/03/2023  Component Date Value Ref Range Status   Vent Rate (bpm) 10/03/2023 61   Final   PR Interval (msec) 10/03/2023 162   Final   QRS Interval (msec) 10/03/2023 92   Final   QT Interval (msec) 10/03/2023 434   Final   QTc (msec) 10/03/2023 436   Final   WBC (White Blood Cell Count) 10/03/2023 7.1  3.2 - 9.8 x109/L Final   Hemoglobin 10/03/2023 13.4  11.7 - 15.5 g/dL Final   Hematocrit 90/88/7974 41.8  35.0 - 45.0 % Final   Platelets 10/03/2023 164  150 - 450 x109/L Final   MCV (Mean Corpuscular Volume) 10/03/2023 90  80 - 98 fL Final   MCH (Mean Corpuscular Hemoglobin) 10/03/2023 28.9  26.5 - 34.0 pg Final   MCHC (Mean Corpuscular Hemoglobin * 10/03/2023 32.1  31.0 - 36.0 % Final   RBC (Red Blood Cell Count) 10/03/2023 4.64  3.77 - 5.16 x1012/L Final   RDW-CV (Red Cell Distribution Widt* 10/03/2023 13.5  11.5 - 14.5 % Final   NRBC (Nucleated Red Blood Cell Cou* 10/03/2023 0.00  0 x109/L Final   NRBC % (Nucleated Red Blood Cell %) 10/03/2023 0.0  % Final   MPV (Mean Platelet Volume) 10/03/2023 10.3  7.2 - 11.7 fL Final   Sodium 10/03/2023 139  135 - 145 mmol/L Final   Potassium 10/03/2023 4.3  3.5 - 5.0 mmol/L Final   Chloride 10/03/2023 103  98 - 108 mmol/L Final   Carbon Dioxide (CO2) 10/03/2023 27  21 - 30 mmol/L Final   Urea Nitrogen (BUN) 10/03/2023 11  7 - 20 mg/dL Final   Creatinine 90/88/7974 1.0  0.4 - 1.0 mg/dL Final  Glucose 10/03/2023 110  70 - 140 mg/dL Final   Calcium  10/03/2023 9.4  8.7 - 10.2 mg/dL Final   Anion Gap 90/88/7974 9  3 - 12 mmol/L Final   BUN/CREA Ratio 10/03/2023 11  6 - 27 Final   Glomerular Filtration Rate (eGFR)  10/03/2023 58  mL/min/1.73sq m Final  Admission on 09/17/2023, Discharged on 09/18/2023  Component Date Value Ref Range Status   ABO RH TYPE 09/17/2023 B Negative   Final   POC ACT Kaolin 09/17/2023 118  100 - 137 sec Final   % CO Hemoglobin, Arterial, OR 09/17/2023 1.5  0.0 - 2.0 % Final   % Methemoglobin, Arterial, OR 09/17/2023 1.2  0.4 - 1.5 % Final   % O2 Hemoglobin, Arterial, OR 09/17/2023 97.3  94.0 - 99.0 % Final   Base Excess, Arterial, OR 09/17/2023 2  -3 - 3 mmol/L Final   Calcium , Ionized, OR 09/17/2023 1.25  1.15 - 1.32 mmol/L Final   Glucose Nonfasting-ABG, OR 09/17/2023 103  70 - 140 mg/dL Final   Bicarbonate, Arterial, OR 09/17/2023 28  20 - 28 mmol/L Final   Hematocrit, Arterial, OR 09/17/2023 38.0  35.0 - 45.0 % Final   Hemoglobin, Arterial, OR 09/17/2023 12.6  12.0 - 15.5 g/dL Final   Potassium, Arterial, OR 09/17/2023 3.5  3.5 - 5.0 mmol/L Final   Lactate, Whole Blood, OR 09/17/2023 0.7  0.6 - 1.8 mmol/L Final   Sodium, Arterial, OR 09/17/2023 139  135 - 145 mmol/L Final   PCO2, Arterial, OR 09/17/2023 51 (H)  35 - 45 mmHg Final   PH, Arterial, OR 09/17/2023 7.35  7.35 - 7.45 Final   PO2, Arterial, OR 09/17/2023 169 (H)  75 - 100 mmHg Final   Total CO2, Arterial, OR 09/17/2023 30  21 - 30 mmol/L Final   Volume % O2, Arterial, OR 09/17/2023 17.6  15.0 - 24.0 % Final   Patient Temperature, Arterial, OR 09/17/2023 37.0  Celcius Final   LV Ejection Fraction (%) 09/17/2023 55  % Final   Tricuspid Valve Regurgitation Grade 09/17/2023 TR Not Assessed   Final   Mitral Valve Regurgitation Grade 09/17/2023 none   Final   Mitral Valve Stenosis Grade 09/17/2023 none   Final   Aortic Valve Regurgitation Grade 09/17/2023 trivial    Final   Aortic Valve Stenosis Grade 09/17/2023 TRANSCATHETER BIOPROSTHETIC   Final   Aortic Valve Stenosis Mean Gradien* 09/17/2023 11  mmHg Final   Aortic Valve Max Velocity (m/s) 09/17/2023 2.2  m/s Final   POC ACT Kaolin 09/17/2023 245 (H)  100 - 137 sec Final   POC ACT Kaolin 09/17/2023 256 (H)  100 - 137 sec Final   POC ACT Kaolin 09/17/2023 106  100 - 137 sec Final   WBC (White Blood Cell Count) 09/17/2023 7.0  3.2 - 9.8 x109/L Final   Hemoglobin 09/17/2023 12.4  11.7 - 15.5 g/dL Final   Hematocrit 91/73/7974 37.7  35.0 - 45.0 % Final   Platelets 09/17/2023 127 (L)  150 - 450 x109/L Final   MCV (Mean Corpuscular Volume) 09/17/2023 88  80 - 98 fL Final   MCH (Mean Corpuscular Hemoglobin) 09/17/2023 28.8  26.5 - 34.0 pg Final   MCHC (Mean Corpuscular Hemoglobin * 09/17/2023 32.9  31.0 - 36.0 % Final   RBC (Red Blood Cell Count) 09/17/2023 4.31  3.77 - 5.16 x1012/L Final   RDW-CV (Red Cell Distribution Widt* 09/17/2023 13.5  11.5 - 14.5 % Final   NRBC (Nucleated Red Blood  Cell Cou* 09/17/2023 0.00  0 x109/L Final   NRBC % (Nucleated Red Blood Cell %) 09/17/2023 0.0  % Final   MPV (Mean Platelet Volume) 09/17/2023 11.1  7.2 - 11.7 fL Final   Immature Platelet Fraction 09/17/2023 4.0  1.6 - 8.5 % Final   Prothrombin Time 09/17/2023 13.3 (H)  9.5 - 13.1 sec Final   Prothrombin INR 09/17/2023 1.2 (H)  0.9 - 1.1 Final   Potassium, Arterial 09/17/2023 3.8  3.5 - 5.0 mmol/L Final   Sodium, Arterial 09/17/2023 135  135 - 145 mmol/L Final   Hemoglobin, Arterial 09/17/2023 12.7  12.0 - 15.5 g/dL Final   Hematocrit, Arterial 09/17/2023 38.0  35.0 - 45.0 % Final   Act Partial Thromboplastin Time 09/17/2023 29.5  26.8 - 37.1 sec Final   Sodium 09/17/2023 138  135 - 145 mmol/L Final   Potassium 09/17/2023 3.8  3.5 - 5.0 mmol/L Final   Chloride 09/17/2023 107  98 - 108 mmol/L Final   Carbon Dioxide (CO2) 09/17/2023 21  21 - 30 mmol/L Final   Urea Nitrogen (BUN)  09/17/2023 13  7 - 20 mg/dL Final   Creatinine 91/73/7974 0.7  0.4 - 1.0 mg/dL Final   Glucose 91/73/7974 98  70 - 140 mg/dL Final   Calcium  09/17/2023 9.0  8.7 - 10.2 mg/dL Final   Anion Gap 91/73/7974 10  3 - 12 mmol/L Final   BUN/CREA Ratio 09/17/2023 18  6 - 27 Final   Glomerular Filtration Rate (eGFR)  09/17/2023 89  mL/min/1.73sq m Final   Patient Temperature, Arterial 09/17/2023 37.0  C Final   FIO2, Arterial 09/17/2023 44.0  % Final   PH, Arterial 09/17/2023 7.43  7.35 - 7.45 Final   PCO2, Arterial 09/17/2023 34 (L)  35 - 45 mmHg Final   PO2, Arterial 09/17/2023 167 (H)  75 - 100 mmHg Final   P/F Ratio 09/17/2023 380 (L)  >=400 Final   Base Excess, Arterial 09/17/2023 -1  -3 - 3 mmol/L Final   Bicarbonate, Arterial 09/17/2023 23  20 - 28 mmol/L Final   Total CO2, Arterial 09/17/2023 24  21 - 30 mmol/L Final   Hemoglobin, Arterial 09/17/2023 12.7  12.0 - 15.5 g/dL Final   % O2 Hemoglobin, Arterial 09/17/2023 96.5  94.0 - 99.0 % Final   % CO Hemoglobin, Arterial 09/17/2023 0.3  <=2.0 % Final   % Methemoglobin, Arterial 09/17/2023 0.0 (L)  0.4 - 1.5 % Final   Volume % O2, Arterial 09/17/2023 17.6  15.0 - 24.0 % Final   Magnesium  09/17/2023 1.8  1.8 - 2.5 mg/dL Final   Vent Rate (bpm) 09/17/2023 65   Final   PR Interval (msec) 09/17/2023 162   Final   QRS Interval (msec) 09/17/2023 96   Final   QT Interval (msec) 09/17/2023 436   Final   QTc (msec) 09/17/2023 453   Final   POC Glucose 09/17/2023 101  70 - 140 mg/dL Final   Sodium 91/72/7974 140  135 - 145 mmol/L Final   Potassium 09/18/2023 3.4 (L)  3.5 - 5.0 mmol/L Final   Chloride 09/18/2023 106  98 - 108 mmol/L Final   Carbon Dioxide (CO2) 09/18/2023 26  21 - 30 mmol/L Final   Urea Nitrogen (BUN) 09/18/2023 13  7 - 20 mg/dL Final   Creatinine 91/72/7974 0.8  0.4 - 1.0 mg/dL Final   Glucose 91/72/7974 100  70 - 140 mg/dL Final   Calcium  09/18/2023 8.7  8.7 - 10.2 mg/dL Final  Anion Gap  09/18/2023 8  3 - 12 mmol/L Final   BUN/CREA Ratio 09/18/2023 17  6 - 27 Final   Glomerular Filtration Rate (eGFR)  09/18/2023 76  mL/min/1.73sq m Final   Magnesium  09/18/2023 2.2  1.8 - 2.5 mg/dL Final   WBC (White Blood Cell Count) 09/18/2023 6.1  3.2 - 9.8 x109/L Final   Hemoglobin 09/18/2023 12.1  11.7 - 15.5 g/dL Final   Hematocrit 91/72/7974 38.3  35.0 - 45.0 % Final   Platelets 09/18/2023 115 (L)  150 - 450 x109/L Final   MCV (Mean Corpuscular Volume) 09/18/2023 92  80 - 98 fL Final   MCH (Mean Corpuscular Hemoglobin) 09/18/2023 29.2  26.5 - 34.0 pg Final   MCHC (Mean Corpuscular Hemoglobin * 09/18/2023 31.6  31.0 - 36.0 % Final   RBC (Red Blood Cell Count) 09/18/2023 4.15  3.77 - 5.16 x1012/L Final   RDW-CV (Red Cell Distribution Widt* 09/18/2023 13.7  11.5 - 14.5 % Final   NRBC (Nucleated Red Blood Cell Cou* 09/18/2023 0.00  0 x109/L Final   NRBC % (Nucleated Red Blood Cell %) 09/18/2023 0.0  % Final   MPV (Mean Platelet Volume) 09/18/2023 11.3  7.2 - 11.7 fL Final   Immature Platelet Fraction 09/18/2023 3.4  1.6 - 8.5 % Final   Vent Rate (bpm) 09/18/2023 54   Final   PR Interval (msec) 09/18/2023 132   Final   QRS Interval (msec) 09/18/2023 98   Final   QT Interval (msec) 09/18/2023 460   Final   QTc (msec) 09/18/2023 436   Final  Pre-Anesthesia on 09/16/2023  Component Date Value Ref Range Status   WBC (White Blood Cell Count) 09/16/2023 5.1  3.2 - 9.8 x109/L Final   Hemoglobin 09/16/2023 14.0  11.7 - 15.5 g/dL Final   Hematocrit 91/74/7974 42.8  35.0 - 45.0 % Final   Platelets 09/16/2023 175  150 - 450 x109/L Final   MCV (Mean Corpuscular Volume) 09/16/2023 91  80 - 98 fL Final   MCH (Mean Corpuscular Hemoglobin) 09/16/2023 29.6  26.5 - 34.0 pg Final   MCHC (Mean Corpuscular Hemoglobin * 09/16/2023 32.7  31.0 - 36.0 % Final   RBC (Red Blood Cell Count) 09/16/2023 4.73  3.77 - 5.16 x1012/L Final   RDW-CV (Red Cell Distribution Widt*  09/16/2023 13.7  11.5 - 14.5 % Final   NRBC (Nucleated Red Blood Cell Cou* 09/16/2023 0.00  0 x109/L Final   NRBC % (Nucleated Red Blood Cell %) 09/16/2023 0.0  % Final   MPV (Mean Platelet Volume) 09/16/2023 11.1  7.2 - 11.7 fL Final   Prothrombin Time 09/16/2023 11.8  9.5 - 13.1 sec Final   Prothrombin INR 09/16/2023 1.0  0.9 - 1.1 Final   Sodium 09/16/2023 140  135 - 145 mmol/L Final   Potassium 09/16/2023 4.0  3.5 - 5.0 mmol/L Final   Chloride 09/16/2023 102  98 - 108 mmol/L Final   Carbon Dioxide (CO2) 09/16/2023 27  21 - 30 mmol/L Final   Urea Nitrogen (BUN) 09/16/2023 14  7 - 20 mg/dL Final   Creatinine 91/74/7974 1.0  0.4 - 1.0 mg/dL Final   Glucose 91/74/7974 116  70 - 140 mg/dL Final   Calcium  09/16/2023 9.7  8.7 - 10.2 mg/dL Final   AST (Aspartate Aminotransferase) 09/16/2023 20  15 - 41 U/L Final   ALT (Alanine Aminotransferase) 09/16/2023 14  10 - 39 U/L Final   Bilirubin, Total 09/16/2023 1.1  0.4 - 1.5 mg/dL Final  Alk Phos (Alkaline Phosphatase) 09/16/2023 73  24 - 110 U/L Final   Albumin 09/16/2023 3.9  3.5 - 4.8 g/dL Final   Protein, Total 09/16/2023 6.5  6.2 - 8.1 g/dL Final   Anion Gap 91/74/7974 11  3 - 12 mmol/L Final   BUN/CREA Ratio 09/16/2023 14  6 - 27 Final   Glomerular Filtration Rate (eGFR)  09/16/2023 58  mL/min/1.73sq m Final   Act Partial Thromboplastin Time 09/16/2023 27.3  26.8 - 37.1 sec Final   Hemoglobin A1C 09/16/2023 5.8 (H)  <5.7 % Final   Average Blood Glucose (Calculated * 09/16/2023 118  mg/dL Final   ABO RH TYPE 91/74/7974 B Negative   Final   Antibody Screen 09/16/2023 Negative   Final   Specimen Outdate 09/16/2023 09-19-2023 23:59   Final   Performing Lab 09/16/2023 [facility] BLOOD BANK LAB   Final   Vent Rate (bpm) 09/16/2023 51   Final   PR Interval (msec) 09/16/2023 150   Final   QRS Interval (msec) 09/16/2023 92   Final   QT Interval (msec) 09/16/2023 458   Final   QTc (msec) 09/16/2023 422   Final   Initial consult on 04/04/2023  Component Date Value Ref Range Status   Sedimentation Rate-Automated 04/04/2023 8  0 - 30 mm/hr Final   C Reactive Protein - [facility] 04/04/2023 <1  0 - 10 mg/L Final  Office Visit on 03/05/2023  Component Date Value Ref Range Status   Vitamin B12 03/05/2023 165 (L)  >300 pg/mL Final   Vit. B1, Whole Blood - [facility] 03/05/2023 112.9  66.5 - 200.0 nmol/L Final   Vitamin B6 - [facility] 03/05/2023 5.8  3.4 - 65.2 ug/L Final   Vitamin D, 25-Hydroxy - [facility] 03/05/2023 38.2  30.0 - 100.0 ng/mL Final   Sedimentation Rate-Automated 03/05/2023 7  0 - 30 mm/hr Final   C Reactive Protein - [facility] 03/05/2023 <1  0 - 10 mg/L Final   Total IgG - [facility] 03/05/2023 554 (L)  586 - 1602 mg/dL Final   Immunoglobulin A, Qn, Serum - [facility] 03/05/2023 120  64 - 422 mg/dL Final   IgM - [facility] 97/88/7974 166  26 - 217 mg/dL Final   Protein Total - [facility] 03/05/2023 6.9  6.0 - 8.5 g/dL Final   Albumin - [facility] 03/05/2023 4.1  2.9 - 4.4 g/dL Final   Joeyj-8-Honalopw - [facility] 03/05/2023 0.3  0.0 - 0.4 g/dL Final   Joeyj-7-Honalopw - [facility] 03/05/2023 0.9  0.4 - 1.0 g/dL Final   Beta Globulin - [facility] 03/05/2023 1.1  0.7 - 1.3 g/dL Final   Gamma Globulin - [facility] 03/05/2023 0.6  0.4 - 1.8 g/dL Final   M-Spike - [facility] 03/05/2023 Not Observed  Not Observed g/dL Final   Globulin, Total - [facility] 03/05/2023 2.8  2.2 - 3.9 g/dL Final   A/G Ratio - [facility] 03/05/2023 1.5  0.7 - 1.7 Final   IFE 1 - [facility] 03/05/2023 Comment   Final   Please note: - [facility] 03/05/2023 Comment   Final   Folate (Folic Acid) 03/05/2023 5.7  >4.0 ng/mL Final   Antinuclear Antibodies, IFA - [facility] 03/05/2023 Negative   Final   Please note: - [facility] 03/05/2023 Comment   Final   RA Latex Turbid. - [facility] 03/05/2023 10.9  <14.0 IU/mL Final  Office Visit on 02/15/2023  Component Date Value Ref Range Status   Cholesterol, Total 02/15/2023 179  100 - 200 mg/dL Final    Triglyceride 98/75/7974 123  35 -  199 mg/dL Final   HDL (High Density Lipoprotein) Cho* 02/15/2023 68.5  35.0 - 85.0 mg/dL Final   LDL Calculated 02/15/2023 86  0 - 130 mg/dL Final   VLDL Cholesterol 02/15/2023 25  mg/dL Final   Cholesterol/HDL Ratio 02/15/2023 2.6   Final   WBC (White Blood Cell Count) 02/15/2023 8.9  4.1 - 10.2 103/uL Final   RBC (Red Blood Cell Count) 02/15/2023 5.04  4.04 - 5.48 106/uL Final   Hemoglobin 02/15/2023 15.3 (H)  12.0 - 15.0 gm/dL Final   Hematocrit 98/75/7974 46.1  35.0 - 47.0 % Final   MCV (Mean Corpuscular Volume) 02/15/2023 91.5  80.0 - 100.0 fl Final   MCH (Mean Corpuscular Hemoglobin) 02/15/2023 30.4  27.0 - 31.2 pg Final   MCHC (Mean Corpuscular Hemoglobin * 02/15/2023 33.2  32.0 - 36.0 gm/dL Final   Platelet Count 02/15/2023 194  150 - 450 103/uL Final   RDW-CV (Red Cell Distribution Widt* 02/15/2023 13.3  11.6 - 14.8 % Final   MPV (Mean Platelet Volume) 02/15/2023 10.8  9.4 - 12.4 fl Final   Neutrophils 02/15/2023 6.79  1.50 - 7.80 103/uL Final   Lymphocytes 02/15/2023 1.41  1.00 - 3.60 103/uL Final   Monocytes 02/15/2023 0.53  0.00 - 1.50 103/uL Final   Eosinophils 02/15/2023 0.12  0.00 - 0.55 103/uL Final   Basophils 02/15/2023 0.03  0.00 - 0.09 103/uL Final   Neutrophil % 02/15/2023 76.3 (H)  32.0 - 70.0 % Final   Lymphocyte % 02/15/2023 15.8  10.0 - 50.0 % Final   Monocyte % 02/15/2023 5.9  4.0 - 13.0 % Final   Eosinophil % 02/15/2023 1.3  1.0 - 5.0 % Final   Basophil% 02/15/2023 0.3  0.0 - 2.0 % Final   Immature Granulocyte % 02/15/2023 0.4  <=0.7 % Final   Immature Granulocyte Count 02/15/2023 0.04  <=0.06 10^3/L Final   Glucose 02/15/2023 110  70 - 110 mg/dL Final   Sodium 98/75/7974 141  136 - 145 mmol/L Final   Potassium 02/15/2023 4.0  3.6 - 5.1 mmol/L Final   Chloride 02/15/2023 103  97 - 109 mmol/L Final   Carbon Dioxide (CO2) 02/15/2023 29.2  22.0 - 32.0 mmol/L Final   Urea Nitrogen (BUN)  02/15/2023 14  7 - 25 mg/dL Final   Creatinine 98/75/7974 1.0  0.6 - 1.1 mg/dL Final   Glomerular Filtration Rate (eGFR) 02/15/2023 58 (L)  >60 mL/min/1.73sq m Final   Calcium  02/15/2023 10.0  8.7 - 10.3 mg/dL Final   AST  98/75/7974 13  8 - 39 U/L Final   ALT  02/15/2023 9  5 - 38 U/L Final   Alk Phos (alkaline Phosphatase) 02/15/2023 85  34 - 104 U/L Final   Albumin 02/15/2023 4.5  3.5 - 4.8 g/dL Final   Bilirubin, Total 02/15/2023 0.9  0.3 - 1.2 mg/dL Final   Protein, Total 02/15/2023 6.9  6.1 - 7.9 g/dL Final   A/G Ratio 98/75/7974 1.9  1.0 - 5.0 gm/dL Final   Thyroid  Stimulating Hormone (TSH) 02/15/2023 9.164 (H)  0.450-5.330 uIU/ml uIU/mL Final   Color 02/15/2023 Light Yellow  Colorless, Straw, Light Yellow, Yellow, Dark Yellow Final   Clarity 02/15/2023 Clear  Clear Final   Specific Gravity 02/15/2023 1.011  1.005 - 1.030 Final   pH, Urine 02/15/2023 5.0  5.0 - 8.0 Final   Protein, Urinalysis 02/15/2023 Negative  Negative mg/dL Final   Glucose, Urinalysis 02/15/2023 Negative  Negative mg/dL Final   Ketones, Urinalysis 02/15/2023 Negative  Negative mg/dL Final   Blood, Urinalysis 02/15/2023 Negative  Negative Final   Nitrite, Urinalysis 02/15/2023 Negative  Negative Final   Leukocyte Esterase, Urinalysis 02/15/2023 1+ (!)  Negative Final   Hyaline Casts, Urinalysis 02/15/2023 6  /lpf Final   Bilirubin, Urinalysis 02/15/2023 Negative  Negative Final   Urobilinogen, Urinalysis 02/15/2023 0.2  0.2 - 1.0 mg/dL Final   WBC, UA 98/75/7974 2  <=5 /hpf Final   Red Blood Cells, Urinalysis 02/15/2023 1  <=3 /hpf Final   Bacteria, Urinalysis 02/15/2023 0-5  0 - 5 /hpf Final   Squamous Epithelial Cells, Urinaly* 02/15/2023 1  /hpf Final   Hemoglobin A1C 02/15/2023 5.8 (H)  4.2 - 5.6 % Final   Average Blood Glucose (Calc) 02/15/2023 120  mg/dL Final   DIAGNOSIS: Primary hypertension  (primary encounter diagnosis)  Class 2 severe obesity due to excess calories  with serious comorbidity and body mass index (BMI) of 37.0 to 37.9 in adult  Pure hypercholesterolemia  Gastroesophageal reflux disease without esophagitis  Non-cardiac chest pain   PLAN: CP- refer to Cardiology HTN- stable, same meds HLD-diet/exercise/statin, labs today Thyroid  dz- same dose, labs today Prediabetes- diet/exercise/water, labs todat RTC 3 mo, sooner if needed     Attestation Statement:   I personally performed the service. (TP)  Reyes JONETTA Costa, MD, MD

## 2024-02-17 DIAGNOSIS — Z952 Presence of prosthetic heart valve: Secondary | ICD-10-CM

## 2024-02-20 ENCOUNTER — Ambulatory Visit
Admission: RE | Admit: 2024-02-20 | Discharge: 2024-02-20 | Disposition: A | Discharge status: Home / Self Care | Source: Home / Self Care | Attending: Student | Admitting: Student

## 2024-02-20 ENCOUNTER — Encounter: Payer: Self-pay | Admitting: Cardiovascular Disease

## 2024-02-20 ENCOUNTER — Other Ambulatory Visit: Payer: Self-pay

## 2024-02-20 ENCOUNTER — Encounter
Admission: RE | Disposition: A | Discharge status: Home / Self Care | Payer: Self-pay | Source: Home / Self Care | Attending: Cardiovascular Disease

## 2024-02-20 ENCOUNTER — Ambulatory Visit: Admitting: Anesthesiology

## 2024-02-20 ENCOUNTER — Ambulatory Visit
Admission: RE | Admit: 2024-02-20 | Discharge: 2024-02-20 | Disposition: A | Discharge status: Home / Self Care | Attending: Cardiovascular Disease | Admitting: Cardiovascular Disease

## 2024-02-20 DIAGNOSIS — K449 Diaphragmatic hernia without obstruction or gangrene: Secondary | ICD-10-CM | POA: Insufficient documentation

## 2024-02-20 DIAGNOSIS — Z952 Presence of prosthetic heart valve: Secondary | ICD-10-CM | POA: Insufficient documentation

## 2024-02-20 DIAGNOSIS — Y839 Surgical procedure, unspecified as the cause of abnormal reaction of the patient, or of later complication, without mention of misadventure at the time of the procedure: Secondary | ICD-10-CM | POA: Insufficient documentation

## 2024-02-20 DIAGNOSIS — Z79890 Hormone replacement therapy (postmenopausal): Secondary | ICD-10-CM | POA: Diagnosis not present

## 2024-02-20 DIAGNOSIS — I350 Nonrheumatic aortic (valve) stenosis: Secondary | ICD-10-CM | POA: Diagnosis present

## 2024-02-20 DIAGNOSIS — Z7982 Long term (current) use of aspirin: Secondary | ICD-10-CM | POA: Diagnosis not present

## 2024-02-20 DIAGNOSIS — T82897A Other specified complication of cardiac prosthetic devices, implants and grafts, initial encounter: Secondary | ICD-10-CM | POA: Insufficient documentation

## 2024-02-20 DIAGNOSIS — I351 Nonrheumatic aortic (valve) insufficiency: Secondary | ICD-10-CM | POA: Insufficient documentation

## 2024-02-20 DIAGNOSIS — I10 Essential (primary) hypertension: Secondary | ICD-10-CM | POA: Insufficient documentation

## 2024-02-20 DIAGNOSIS — K219 Gastro-esophageal reflux disease without esophagitis: Secondary | ICD-10-CM | POA: Diagnosis not present

## 2024-02-20 DIAGNOSIS — Z79899 Other long term (current) drug therapy: Secondary | ICD-10-CM | POA: Insufficient documentation

## 2024-02-20 DIAGNOSIS — I2510 Atherosclerotic heart disease of native coronary artery without angina pectoris: Secondary | ICD-10-CM | POA: Insufficient documentation

## 2024-02-20 LAB — ECHO TEE

## 2024-02-20 MED ORDER — LIDOCAINE HCL (CARDIAC) PF 100 MG/5ML IV SOSY
PREFILLED_SYRINGE | INTRAVENOUS | Status: DC | PRN
  Administered 2024-02-20: 100 mg via INTRAVENOUS

## 2024-02-20 MED ORDER — LIDOCAINE VISCOUS HCL 2 % MT SOLN
OROMUCOSAL | Status: AC
  Filled 2024-02-20: qty 15

## 2024-02-20 MED ORDER — PROPOFOL 500 MG/50ML IV EMUL
INTRAVENOUS | Status: DC | PRN
  Administered 2024-02-20 (×5): 10 mg via INTRAVENOUS
  Administered 2024-02-20: 20 mg via INTRAVENOUS
  Administered 2024-02-20 (×4): 10 mg via INTRAVENOUS
  Administered 2024-02-20: 20 mg via INTRAVENOUS
  Administered 2024-02-20 (×7): 10 mg via INTRAVENOUS
  Administered 2024-02-20: 20 mg via INTRAVENOUS
  Administered 2024-02-20: 120 mg via INTRAVENOUS
  Administered 2024-02-20 (×3): 10 mg via INTRAVENOUS

## 2024-02-20 MED ORDER — BUTAMBEN-TETRACAINE-BENZOCAINE 2-2-14 % EX AERO
INHALATION_SPRAY | CUTANEOUS | Status: AC
  Filled 2024-02-20: qty 5

## 2024-02-20 MED ORDER — LIDOCAINE HCL (PF) 2 % IJ SOLN
INTRAMUSCULAR | Status: AC
  Filled 2024-02-20: qty 5

## 2024-02-20 MED ORDER — PROPOFOL 10 MG/ML IV BOLUS
INTRAVENOUS | Status: AC
  Filled 2024-02-20: qty 60

## 2024-02-20 MED ORDER — FENTANYL CITRATE (PF) 100 MCG/2ML IJ SOLN: 25.0000 ug | INTRAMUSCULAR | Status: DC | PRN

## 2024-02-20 MED ORDER — SODIUM CHLORIDE 0.9 % IV SOLN: INTRAVENOUS | Status: DC

## 2024-02-20 MED ORDER — PHENYLEPHRINE 80 MCG/ML (10ML) SYRINGE FOR IV PUSH (FOR BLOOD PRESSURE SUPPORT)
PREFILLED_SYRINGE | INTRAVENOUS | Status: DC | PRN
  Administered 2024-02-20: 40 ug via INTRAVENOUS

## 2024-02-20 NOTE — Transfer of Care (Signed)
 Immediate Anesthesia Transfer of Care Note  Patient: Brianna Dalton  Procedure(s) Performed: ECHOCARDIOGRAM, TRANSESOPHAGEAL  Patient Location: [facility], room 14  Anesthesia Type:General  Level of Consciousness: drowsy  Airway & Oxygen Therapy: Patient Spontanous Breathing and Patient connected to face mask oxygen  Post-op Assessment: Report given to RN and Post -op Vital signs reviewed and stable  Post vital signs: Reviewed and stable  Last Vitals:  Vitals Value Taken Time  BP 116/59 1321  Temp 35.9 1321  Pulse 52 1321  Resp 18 1321  SpO2 98% 1321    Last Pain:  Vitals:   02/20/24 1150  TempSrc: Temporal  PainSc: 0-No pain    Pt. Being recovered in the procedure room, [facility] room 14 by RN resuming care of her.      Complications: No notable events documented.

## 2024-02-20 NOTE — Anesthesia Preprocedure Evaluation (Signed)
 "                                  Anesthesia Evaluation  Patient identified by MRN, date of birth, ID band Patient awake    Reviewed: Allergy & Precautions, NPO status , Patient's Chart, lab work & pertinent test results  Airway Mallampati: II  TM Distance: >3 FB Neck ROM: Full    Dental  (+) Teeth Intact   Pulmonary neg pulmonary ROS   Pulmonary exam normal        Cardiovascular Exercise Tolerance: Good hypertension, Pt. on medications + CAD  negative cardio ROS Normal cardiovascular exam Rhythm:Regular  s/p aortic valve replacement 2025   Neuro/Psych negative neurological ROS  negative psych ROS   GI/Hepatic negative GI ROS, Neg liver ROS, hiatal hernia,GERD  Medicated,,  Endo/Other  negative endocrine ROSHypothyroidism    Renal/GU negative Renal ROS  negative genitourinary   Musculoskeletal  (+) Arthritis ,    Abdominal Normal abdominal exam  (+)   Peds negative pediatric ROS (+)  Hematology negative hematology ROS (+)   Anesthesia Other Findings Past Medical History: No date: Angina pectoris syndrome No date: Arterial hemorrhage     Comment:  Lt ankle possible venous bleed No date: Arthritis No date: Coronary artery disease No date: Hypertension No date: Hypothyroidism No date: Neuropathy No date: Varicose vein of leg  Past Surgical History: No date: ABDOMINAL HYSTERECTOMY 01/19/2013: BREAST BIOPSY; Right     Comment:  stereo biopsy of two areas neg 03/28/2019: CHOLECYSTECTOMY; N/A     Comment:  Procedure: LAPAROSCOPIC CHOLECYSTECTOMY;  Surgeon:               Desiderio Schanz, MD;  Location: [facility] ORS;  Service:               General;  Laterality: N/A; 11/14/2023: ESOPHAGOGASTRODUODENOSCOPY; N/A     Comment:  Procedure: EGD (ESOPHAGOGASTRODUODENOSCOPY);  Surgeon:               Jinny Carmine, MD;  Location: [facility] ENDOSCOPY;  Service:               Endoscopy;  Laterality: N/A; No date: JOINT REPLACEMENT     Comment:  lt knee 04/27/2015:  KNEE ARTHROPLASTY; Right     Comment:  Procedure: COMPUTER ASSISTED TOTAL KNEE ARTHROPLASTY;                Surgeon: Lynwood SHAUNNA Hue, MD;  Location: [facility] ORS;                Service: Orthopedics;  Laterality: Right; 04/10/2017: LEFT HEART CATH AND CORONARY ANGIOGRAPHY; N/A     Comment:  Procedure: LEFT HEART CATH AND CORONARY ANGIOGRAPHY;                Surgeon: Bosie Vinie LABOR, MD;  Location: [facility] INVASIVE CV              LAB;  Service: Cardiovascular;  Laterality: N/A; 07/10/2023: LEFT HEART CATH AND CORONARY ANGIOGRAPHY; N/A     Comment:  Procedure: LEFT HEART CATH AND CORONARY ANGIOGRAPHY;                Surgeon: Katina Albright, MD;  Location: [facility] INVASIVE CV               LAB;  Service: Cardiovascular;  Laterality: N/A; No date: SHOULDER ARTHROSCOPY; Left No date: THYROID  SURGERY; Bilateral No date: TUMOR REMOVAL;  Right     Comment:  neck     Reproductive/Obstetrics negative OB ROS                              Anesthesia Physical Anesthesia Plan  ASA: 3  Anesthesia Plan: General   Post-op Pain Management:    Induction:   PONV Risk Score and Plan: Propofol  infusion and TIVA  Airway Management Planned: Natural Airway and Nasal Cannula  Additional Equipment:   Intra-op Plan:   Post-operative Plan:   Informed Consent: I have reviewed the patients History and Physical, chart, labs and discussed the procedure including the risks, benefits and alternatives for the proposed anesthesia with the patient or authorized representative who has indicated his/her understanding and acceptance.     Dental Advisory Given  Plan Discussed with: CRNA  Anesthesia Plan Comments:         Anesthesia Quick Evaluation  "

## 2024-02-20 NOTE — Anesthesia Postprocedure Evaluation (Signed)
"   Anesthesia Post Note  Patient: Brianna Dalton  Procedure(s) Performed: ECHOCARDIOGRAM, TRANSESOPHAGEAL  Patient location during evaluation: PACU Anesthesia Type: General Level of consciousness: awake and alert Pain management: pain level controlled Vital Signs Assessment: post-procedure vital signs reviewed and stable Respiratory status: spontaneous breathing Cardiovascular status: stable Anesthetic complications: no   No notable events documented.   Last Vitals:  Vitals:   02/20/24 1400 02/20/24 1404  BP: (!) 168/107 (!) 181/85  Pulse: (!) 55   Resp: 17   Temp: 36.5 C   SpO2: 100%     Last Pain:  Vitals:   02/20/24 1400  TempSrc: Temporal  PainSc: 0-No pain                 VAN STAVEREN,Lenzy Kerschner      "

## 2024-02-20 NOTE — H&P (Signed)
 Pre-procedure History & Physical    Patient ID: Brianna Dalton MRN: [id number]; DOB: Mar 04, 1945   Date of procedure: 02/20/2024  Primary Care Provider: Auston Reyes BIRCH, MD Primary Cardiologist: None   Planned procedure:  TEE  HPI:   Brianna Dalton is a 79 y.o. female with AS s/p TAVR here for TEE  Past Medical History:  Diagnosis Date   Angina pectoris syndrome    Arterial hemorrhage    Lt ankle possible venous bleed   Arthritis    Coronary artery disease    Hypertension    Hypothyroidism    Neuropathy    Varicose vein of leg     Past Surgical History:  Procedure Laterality Date   ABDOMINAL HYSTERECTOMY     BREAST BIOPSY Right 01/19/2013   stereo biopsy of two areas neg   CHOLECYSTECTOMY N/A 03/28/2019   Procedure: LAPAROSCOPIC CHOLECYSTECTOMY;  Surgeon: Desiderio Schanz, MD;  Location: [facility] ORS;  Service: General;  Laterality: N/A;   ESOPHAGOGASTRODUODENOSCOPY N/A 11/14/2023   Procedure: EGD (ESOPHAGOGASTRODUODENOSCOPY);  Surgeon: Jinny Carmine, MD;  Location[facility] ENDOSCOPY;  Service: Endoscopy;  Laterality: N/A;   JOINT REPLACEMENT     lt knee   KNEE ARTHROPLASTY Right 04/27/2015   Procedure: COMPUTER ASSISTED TOTAL KNEE ARTHROPLASTY;  Surgeon: Lynwood SHAUNNA Hue, MD;  Location: [facility] ORS;  Service: Orthopedics;  Laterality: Right;   LEFT HEART CATH AND CORONARY ANGIOGRAPHY N/A 04/10/2017   Procedure: LEFT HEART CATH AND CORONARY ANGIOGRAPHY;  Surgeon: Bosie Vinie LABOR, MD;  Location: [facility] INVASIVE CV LAB;  Service: Cardiovascular;  Laterality: N/A;   LEFT HEART CATH AND CORONARY ANGIOGRAPHY N/A 07/10/2023   Procedure: LEFT HEART CATH AND CORONARY ANGIOGRAPHY;  Surgeon: Katina Albright, MD;  Location: [facility] INVASIVE CV LAB;  Service: Cardiovascular;  Laterality: N/A;   SHOULDER ARTHROSCOPY Left    THYROID  SURGERY Bilateral    TUMOR REMOVAL Right    neck     Medications Prior to Admission: Prior to Admission medications  Medication Sig Start Date End Date Taking? Authorizing  Provider  acetaminophen  (TYLENOL ) 500 MG tablet Take 500-1,000 mg by mouth daily as needed for mild pain (pain score 1-3) or moderate pain (pain score 4-6). 500mg  during the day as needed 1000mg  at bedtime as needed   Yes [provider]  aspirin  EC 81 MG tablet Take 81 mg by mouth daily. Swallow whole.   Yes [provider]  cyclobenzaprine (FLEXERIL) 10 MG tablet Take 10 mg by mouth at bedtime.   Yes [provider]  levothyroxine  (SYNTHROID ) 100 MCG tablet Take 100 mcg by mouth daily before breakfast.   Yes [provider]  rosuvastatin  (CRESTOR ) 10 MG tablet Take 10 mg by mouth daily. 06/25/23  Yes [provider]  torsemide  (DEMADEX ) 10 MG tablet Take 1 tablet (10 mg total) by mouth daily as needed. Patient taking differently: Take 10 mg by mouth daily. 11/14/23  Yes Wieting, Richard, MD  celecoxib  (CELEBREX ) 100 MG capsule Take 100 mg by mouth 2 (two) times daily. Patient not taking: Reported on 02/11/2024 12/27/23   [provider]  pantoprazole  (PROTONIX ) 40 MG tablet Take 1 tablet (40 mg total) by mouth at bedtime. 11/14/23 12/14/23  Josette Ade, MD  triamcinolone  cream (KENALOG ) 0.5 % Apply 1 Application topically 2 (two) times daily. Patient not taking: Reported on 02/11/2024 12/31/23   [provider]     Allergies:   Allergies[1]  Social History:   Social History   Socioeconomic History   Marital status: Divorced  Spouse name: Not on file   Number of children: Not on file   Years of education: Not on file   Highest education level: Not on file  Occupational History   Not on file  Tobacco Use   Smoking status: Never   Smokeless tobacco: Never  Substance and Sexual Activity   Alcohol use: No   Drug use: No   Sexual activity: Not Currently  Other Topics Concern   Not on file  Social History Narrative   Not on file   Social Drivers of Health   Tobacco Use: Low Risk  (02/05/2024)   Received from [facility]   Patient History    Smoking Tobacco Use: Never    Smokeless Tobacco Use: Never    Passive Exposure: Not on file  Financial Resource Strain: Low Risk  (12/27/2023)   Received from [facility]   Overall Financial Resource Strain (CARDIA)    Difficulty of Paying Living Expenses: Not hard at all  Food Insecurity: No Food Insecurity (12/27/2023)   Received from [facility]   Epic    Within the past 12 months, you worried that your food would run out before you got the money to buy more.: Never true    Within the past 12 months, the food you bought just didn't last and you didn't have money to get more.: Never true  Transportation Needs: No Transportation Needs (12/27/2023)   Received from [facility] - Transportation    In the past 12 months, has lack of transportation kept you from medical appointments or from getting medications?: No    Lack of Transportation (Non-Medical): No  Physical Activity: Not on file  Stress: Not on file  Social Connections: Moderately Integrated (11/13/2023)   Social Connection and Isolation Panel    Frequency of Communication with Friends and Family: More than three times a week    Frequency of Social Gatherings with Friends and Family: Once a week    Attends Religious Services: More than 4 times per year    Active Member of [employer] or Organizations: Yes    Attends Banker Meetings: More than 4 times per year    Marital Status: Divorced  Intimate Partner Violence: Not At Risk (11/13/2023)   Epic    Fear of Current or Ex-Partner: No    Emotionally Abused: No    Physically Abused: No    Sexually Abused: No  Depression (PHQ2-9): Not on file  Alcohol Screen: Not on file  Housing: Low Risk  (12/27/2023)   Received from [facility]   Epic    In the last 12 months, was there a time when you were not able to pay the mortgage or rent on time?: No     In the past 12 months, how many times have you moved where you were living?: 0    At any time in the past 12 months, were you homeless or living in a shelter (including now)?: No  Utilities: Not At Risk (12/27/2023)   Received from [facility]   Epic    In the past 12 months has the electric, gas, oil, or water company threatened to shut off services in your home?: No  Health Literacy: Not on file     Family History:   The patient's family history includes Breast cancer in her sister.    ROS:  Please see the history of present  illness.  All other ROS reviewed and negative.     Physical Exam/Data:   Vitals:   02/20/24 1150  BP: (!) 186/90  Pulse: 69  Resp: 12  Temp: (!) 97.2 F (36.2 C)  TempSrc: Temporal  SpO2: 98%   No intake or output data in the 24 hours ending 02/20/24 1153    01/27/2024   12:22 PM 11/12/2023    8:33 PM 07/22/2023   12:39 PM  Last 3 Weights  Weight (lbs) 198 lb 267 lb 246 lb  Weight (kg) 89.812 kg 121.11 kg 111.585 kg     There is no height or weight on file to calculate BMI.  Wt Readings from Last 3 Encounters:  01/27/24 89.8 kg  11/12/23 121.1 kg  07/22/23 111.6 kg    Physical Exam: General: no acute distress. Head: Normocephalic, atraumatic  Neck: supple Lungs: nl effort Heart: RRR, +murmur Abdomen: Soft Msk:  Strength and tone appear normal for age. Extremities: warm, dry Neuro: awake and alert Psych:  Responds to questions appropriately with a normal affect.    Laboratory Data:  ChemistryNo results for input(s): NA, K, CL, CO2, GLUCOSE, BUN, CREATININE, CALCIUM , GFRNONAA, GFRAA, ANIONGAP in the last 168 hours.  No results for input(s): PROT, ALBUMIN, AST, ALT, ALKPHOS, BILITOT in the last 168 hours. HematologyNo results for input(s): WBC, RBC, HGB, HCT, MCV, MCH, MCHC, RDW, PLT in the last 168 hours.  Assessment and Plan   Will proceed w/ TEE  ASA  III  Anesthesia to be administered by CRNA  Signed, DEARL LEAVEN, MD 02/20/2024, 11:53 AM     [1]  Allergies Allergen Reactions   Oxycodone -Acetaminophen  Hives, Rash, Shortness Of Breath and Swelling   Strawberry Flavoring Agent (Non-Screening) Shortness Of Breath and Rash   Aloe Vera Hives   Amlodipine     Unsure of reaction   Hydrocodone Hives   Latex     NEGATIVE BY IgE (<0.10)   Morphine  And Codeine Other (See Comments)    Nausea delusion    Penicillins Hives    Has patient had a PCN reaction causing immediate rash, facial/tongue/throat swelling, SOB or lightheadedness with hypotension: Yes Has patient had a PCN reaction causing severe rash involving mucus membranes or skin necrosis: No Has patient had a PCN reaction that required hospitalization: No Has patient had a PCN reaction occurring within the last 10 years: No If all of the above answers are NO, then may proceed with Cephalosporin use.   Meperidine Rash

## 2024-02-20 NOTE — Op Note (Signed)
 Transesophageal echocardiogram preliminary report  LILIE VEZINA [id number] Jan 23, 1945  Preliminary diagnosis s/p TAVR  Postprocedural diagnosis same  Time out A timeout was performed by the nursing staff and physicians specifically identifying the procedure performed, identification of the patient, the type of sedation, all allergies and medications, all pertinent medical history, and presedation assessment of nasopharynx.  The patient and or family understand the risks of the procedure including the rare risks of death, stroke, heart attack, esophogeal perforation, sore throat, and reaction to medications given.  General anesthesia CRNA administered GA. See MAR.  370 mg propofol . The patient had continued monitoring of heart rate, oxygenation, blood pressure, respiratory rate, and extent of signs of sedation throughout the entire procedure.  The patient received anesthesia over a period of 20 minutes.  CRNA, nursing staff and I were present during the procedure when the patient was anesthetized for 100% of the time.  Treatment considerations S/p TAVR, w/ paravalvular regurgitation - full report to follow  For further details of transesophageal echocardiogram please refer to final report.  Bonney ROCHER [facility] MD [facility] 02/20/2024 1:18 PM

## 2024-02-21 ENCOUNTER — Encounter: Payer: Self-pay | Admitting: Cardiovascular Disease
